# Patient Record
Sex: Female | Born: 1985 | Race: White | Hispanic: No | Smoking: Former smoker
Health system: Southern US, Community
[De-identification: ages and names within clinical notes are randomized; demographics above are authoritative.]

## PROBLEM LIST (undated history)

## (undated) ENCOUNTER — Emergency Department: Admission: EM | Payer: Medicaid Other | Source: Home / Self Care

## (undated) DIAGNOSIS — M359 Systemic involvement of connective tissue, unspecified: Secondary | ICD-10-CM

## (undated) DIAGNOSIS — N281 Cyst of kidney, acquired: Secondary | ICD-10-CM

## (undated) DIAGNOSIS — N12 Tubulo-interstitial nephritis, not specified as acute or chronic: Secondary | ICD-10-CM

## (undated) DIAGNOSIS — F410 Panic disorder [episodic paroxysmal anxiety] without agoraphobia: Secondary | ICD-10-CM

## (undated) DIAGNOSIS — G935 Compression of brain: Secondary | ICD-10-CM

## (undated) DIAGNOSIS — F419 Anxiety disorder, unspecified: Secondary | ICD-10-CM

## (undated) DIAGNOSIS — R310 Gross hematuria: Secondary | ICD-10-CM

## (undated) HISTORY — DX: Panic disorder (episodic paroxysmal anxiety): F41.0

## (undated) HISTORY — PX: MYRINGOTOMY WITH TUBE PLACEMENT: SHX5663

## (undated) HISTORY — DX: Cyst of kidney, acquired: N28.1

## (undated) HISTORY — PX: DENTAL SURGERY: SHX609

## (undated) HISTORY — DX: Tubulo-interstitial nephritis, not specified as acute or chronic: N12

## (undated) HISTORY — DX: Anxiety disorder, unspecified: F41.9

## (undated) HISTORY — PX: CYST REMOVAL PEDIATRIC: SHX6282

## (undated) HISTORY — DX: Gross hematuria: R31.0

## (undated) HISTORY — PX: SPINE SURGERY: SHX786

---

## 2013-09-25 ENCOUNTER — Emergency Department: Payer: Self-pay | Admitting: Emergency Medicine

## 2013-09-25 LAB — URINALYSIS, COMPLETE
Bilirubin,UR: NEGATIVE
Glucose,UR: 50 mg/dL (ref 0–75)
Ketone: NEGATIVE
Nitrite: NEGATIVE
PH: 6 (ref 4.5–8.0)
Protein: >=500
RBC,UR: 10113 /HPF (ref 0–5)
Specific Gravity: 1.015 (ref 1.003–1.030)
Squamous Epithelial: NONE SEEN
WBC UR: 1139 /HPF (ref 0–5)

## 2013-09-25 LAB — COMPREHENSIVE METABOLIC PANEL
ALBUMIN: 3.1 g/dL — AB (ref 3.4–5.0)
Alkaline Phosphatase: 56 U/L
Anion Gap: 7 (ref 7–16)
BILIRUBIN TOTAL: 0.4 mg/dL (ref 0.2–1.0)
BUN: 3 mg/dL — AB (ref 7–18)
CHLORIDE: 108 mmol/L — AB (ref 98–107)
Calcium, Total: 8.3 mg/dL — ABNORMAL LOW (ref 8.5–10.1)
Co2: 24 mmol/L (ref 21–32)
Creatinine: 0.25 mg/dL — ABNORMAL LOW (ref 0.60–1.30)
EGFR (Non-African Amer.): >60
GLUCOSE: 81 mg/dL (ref 65–99)
Osmolality: 273 (ref 275–301)
Potassium: 3.3 mmol/L — ABNORMAL LOW (ref 3.5–5.1)
SGOT(AST): 20 U/L (ref 15–37)
SGPT (ALT): 16 U/L (ref 12–78)
SODIUM: 139 mmol/L (ref 136–145)
Total Protein: 6.5 g/dL (ref 6.4–8.2)

## 2013-09-25 LAB — CBC
HCT: 33.9 % — ABNORMAL LOW
HGB: 11.1 g/dL — ABNORMAL LOW
MCH: 29.6 pg
MCHC: 32.9 g/dL
MCV: 90 fL
Platelet: 174 x10 3/mm 3
RBC: 3.77 X10 6/mm 3 — ABNORMAL LOW
RDW: 13.5 %
WBC: 18 x10 3/mm 3 — ABNORMAL HIGH

## 2013-09-25 LAB — LIPASE, BLOOD: LIPASE: 86 U/L (ref 73–393)

## 2013-09-25 LAB — HCG, QUANTITATIVE, PREGNANCY: Beta Hcg, Quant.: 17155 m[IU]/mL — ABNORMAL HIGH

## 2013-09-26 LAB — URINE CULTURE

## 2013-09-29 ENCOUNTER — Ambulatory Visit: Payer: Self-pay

## 2013-10-10 ENCOUNTER — Encounter: Payer: Self-pay | Admitting: Maternal & Fetal Medicine

## 2014-02-06 ENCOUNTER — Inpatient Hospital Stay: Payer: Self-pay | Admitting: Obstetrics and Gynecology

## 2014-02-06 LAB — PIH PROFILE
ANION GAP: 9 (ref 7–16)
AST: 17 U/L (ref 15–37)
BUN: 5 mg/dL — ABNORMAL LOW (ref 7–18)
CO2: 24 mmol/L (ref 21–32)
CREATININE: 0.51 mg/dL — AB (ref 0.60–1.30)
Calcium, Total: 8.8 mg/dL (ref 8.5–10.1)
Chloride: 105 mmol/L (ref 98–107)
EGFR (Non-African Amer.): >60
Glucose: 82 mg/dL (ref 65–99)
HCT: 30.9 % — AB (ref 35.0–47.0)
HGB: 9.8 g/dL — ABNORMAL LOW (ref 12.0–16.0)
MCH: 25.7 pg — ABNORMAL LOW (ref 26.0–34.0)
MCHC: 31.6 g/dL — AB (ref 32.0–36.0)
MCV: 81 fL (ref 80–100)
OSMOLALITY: 272 (ref 275–301)
Platelet: 268 10*3/uL (ref 150–440)
Potassium: 3.7 mmol/L (ref 3.5–5.1)
RBC: 3.8 10*6/uL (ref 3.80–5.20)
RDW: 15 % — AB (ref 11.5–14.5)
SODIUM: 138 mmol/L (ref 136–145)
URIC ACID: 3.8 mg/dL (ref 2.6–6.0)
WBC: 13.3 10*3/uL — AB (ref 3.6–11.0)

## 2014-02-07 LAB — PIH PROFILE
AST: 24 U/L (ref 15–37)
Anion Gap: 7 (ref 7–16)
BUN: 6 mg/dL — AB (ref 7–18)
CO2: 24 mmol/L (ref 21–32)
Calcium, Total: 7.4 mg/dL — ABNORMAL LOW (ref 8.5–10.1)
Chloride: 104 mmol/L (ref 98–107)
Creatinine: 0.55 mg/dL — ABNORMAL LOW (ref 0.60–1.30)
EGFR (Non-African Amer.): >60
GLUCOSE: 154 mg/dL — AB (ref 65–99)
HCT: 26 % — AB (ref 35.0–47.0)
HGB: 8.2 g/dL — ABNORMAL LOW (ref 12.0–16.0)
MCH: 25.4 pg — ABNORMAL LOW (ref 26.0–34.0)
MCHC: 31.5 g/dL — ABNORMAL LOW (ref 32.0–36.0)
MCV: 81 fL (ref 80–100)
OSMOLALITY: 271 (ref 275–301)
Platelet: 240 10*3/uL (ref 150–440)
Potassium: 3.8 mmol/L (ref 3.5–5.1)
RBC: 3.21 10*6/uL — ABNORMAL LOW (ref 3.80–5.20)
RDW: 15.1 % — AB (ref 11.5–14.5)
Sodium: 135 mmol/L — ABNORMAL LOW (ref 136–145)
URIC ACID: 3.9 mg/dL (ref 2.6–6.0)
WBC: 22.1 10*3/uL — ABNORMAL HIGH (ref 3.6–11.0)

## 2014-02-07 LAB — PROTEIN / CREATININE RATIO, URINE
Creatinine, Urine: 26.6 mg/dL — ABNORMAL LOW (ref 30.0–125.0)
Protein, Random Urine: 10 mg/dL (ref 0–12)
Protein/Creat. Ratio: 376 mg/gCREAT — ABNORMAL HIGH (ref 0–200)

## 2014-02-08 LAB — PIH PROFILE
ANION GAP: 5 — AB (ref 7–16)
BUN: 3 mg/dL — ABNORMAL LOW (ref 7–18)
CALCIUM: 7 mg/dL — AB (ref 8.5–10.1)
CHLORIDE: 102 mmol/L (ref 98–107)
CO2: 29 mmol/L (ref 21–32)
CREATININE: 0.49 mg/dL — AB (ref 0.60–1.30)
EGFR (African American): >60
Glucose: 91 mg/dL (ref 65–99)
HCT: 26.4 % — ABNORMAL LOW (ref 35.0–47.0)
HGB: 8.4 g/dL — AB (ref 12.0–16.0)
MCH: 25.5 pg — ABNORMAL LOW (ref 26.0–34.0)
MCHC: 31.8 g/dL — ABNORMAL LOW (ref 32.0–36.0)
MCV: 80 fL (ref 80–100)
Osmolality: 268 (ref 275–301)
POTASSIUM: 3.6 mmol/L (ref 3.5–5.1)
Platelet: 259 10*3/uL (ref 150–440)
RBC: 3.28 10*6/uL — ABNORMAL LOW (ref 3.80–5.20)
RDW: 15.1 % — AB (ref 11.5–14.5)
SGOT(AST): 27 U/L (ref 15–37)
SODIUM: 136 mmol/L (ref 136–145)
URIC ACID: 3.2 mg/dL (ref 2.6–6.0)
WBC: 17.6 10*3/uL — AB (ref 3.6–11.0)

## 2014-02-08 LAB — HEMATOCRIT: HCT: 24.6 % — AB (ref 35.0–47.0)

## 2014-03-07 ENCOUNTER — Emergency Department: Payer: Self-pay | Admitting: Emergency Medicine

## 2014-03-07 LAB — URINALYSIS, COMPLETE
Bacteria: NONE SEEN
Bilirubin,UR: NEGATIVE
Glucose,UR: NEGATIVE mg/dL (ref 0–75)
KETONE: NEGATIVE
LEUKOCYTE ESTERASE: NEGATIVE
NITRITE: NEGATIVE
PH: 6 (ref 4.5–8.0)
PROTEIN: NEGATIVE
RBC,UR: 1 /HPF (ref 0–5)
SPECIFIC GRAVITY: 1.009 (ref 1.003–1.030)
Squamous Epithelial: 3
Transitional Epi: <1

## 2014-03-07 LAB — CBC
HCT: 35.5 % (ref 35.0–47.0)
HGB: 11.2 g/dL — ABNORMAL LOW (ref 12.0–16.0)
MCH: 26.5 pg (ref 26.0–34.0)
MCHC: 31.6 g/dL — ABNORMAL LOW (ref 32.0–36.0)
MCV: 84 fL (ref 80–100)
Platelet: 215 10*3/uL (ref 150–440)
RBC: 4.24 10*6/uL (ref 3.80–5.20)
RDW: 17.8 % — AB (ref 11.5–14.5)
WBC: 10.6 10*3/uL (ref 3.6–11.0)

## 2014-03-07 LAB — COMPREHENSIVE METABOLIC PANEL
ALBUMIN: 3.5 g/dL (ref 3.4–5.0)
ALT: 17 U/L
Alkaline Phosphatase: 100 U/L
Anion Gap: 6 — ABNORMAL LOW (ref 7–16)
BILIRUBIN TOTAL: 0.6 mg/dL (ref 0.2–1.0)
BUN: 9 mg/dL (ref 7–18)
Calcium, Total: 8.4 mg/dL — ABNORMAL LOW (ref 8.5–10.1)
Chloride: 104 mmol/L (ref 98–107)
Co2: 28 mmol/L (ref 21–32)
Creatinine: 0.55 mg/dL — ABNORMAL LOW (ref 0.60–1.30)
EGFR (Non-African Amer.): >60
Glucose: 89 mg/dL (ref 65–99)
OSMOLALITY: 274 (ref 275–301)
Potassium: 3.9 mmol/L (ref 3.5–5.1)
SGOT(AST): 16 U/L (ref 15–37)
Sodium: 138 mmol/L (ref 136–145)
Total Protein: 6.9 g/dL (ref 6.4–8.2)

## 2014-03-07 LAB — LIPASE, BLOOD: Lipase: 80 U/L (ref 73–393)

## 2014-06-07 ENCOUNTER — Ambulatory Visit: Payer: Self-pay | Admitting: Physician Assistant

## 2014-06-09 ENCOUNTER — Emergency Department: Payer: Self-pay | Admitting: Emergency Medicine

## 2014-07-17 DIAGNOSIS — M549 Dorsalgia, unspecified: Secondary | ICD-10-CM

## 2014-07-17 DIAGNOSIS — G8929 Other chronic pain: Secondary | ICD-10-CM | POA: Insufficient documentation

## 2014-10-05 LAB — HM PAP SMEAR: HM Pap smear: NEGATIVE

## 2014-10-11 DIAGNOSIS — F419 Anxiety disorder, unspecified: Secondary | ICD-10-CM | POA: Insufficient documentation

## 2014-10-17 NOTE — H&P (Signed)
L&D Evaluation:  History:  HPI [id number], estimated date of confinement 02/13/2014, EGA 39.1 weeks [facility]   Patient's Medical History Anemia; Pyelonephritis;   Patient's Surgical History none   Medications Pre Natal Vitamins   Allergies NKDA   Social History none   Family History Non-Contributory   ROS:  General normal   HEENT normal   CNS HA   GI normal   GU normal   Resp normal   CV normal   Renal normal   MS normal   Exam:  Vital Signs BP >140/90   Urine Protein trace   General no apparent distress   Mental Status clear   Heart normal sinus rhythm   Abdomen gravid, non-tender   Estimated Fetal Weight Average for gestational age, 467"12   Fundal Height 37   Back no CVAT   Edema no edema   Reflexes 3+   Clonus negative   Pelvic no external lesions   Mebranes Intact   FHT normal rate with no decels   Skin dry   Lymph no lymphadenopathy   Other O+/ATB-/NR/RIHIV-/GBS-/Glucola 117/Panorama-   Impression:  Impression 39.0 week Intrauterine pregnancy with PIH   Plan:  Plan Cytotec/Pitocin IOL; Epidural prn   Comments Cath UA. If 1+ protein, Start Magnesium Sulfate Prophylaxis.   Electronic Signatures: Jezreel Sisk, Prentice DockerMartin A (MD)  (Signed 01-Sep-15 07:48)  Authored: L&D Evaluation   Last Updated: 01-Sep-15 07:48 by Jacobb Alen, Prentice DockerMartin A (MD)

## 2014-12-01 ENCOUNTER — Encounter: Payer: Self-pay | Admitting: Urology

## 2014-12-01 ENCOUNTER — Ambulatory Visit (INDEPENDENT_AMBULATORY_CARE_PROVIDER_SITE_OTHER): Payer: BLUE CROSS/BLUE SHIELD | Admitting: Urology

## 2014-12-01 VITALS — BP 107/72 | HR 75 | Ht 61.24 in | Wt 96.8 lb

## 2014-12-01 DIAGNOSIS — N2889 Other specified disorders of kidney and ureter: Secondary | ICD-10-CM | POA: Diagnosis not present

## 2014-12-01 DIAGNOSIS — R310 Gross hematuria: Secondary | ICD-10-CM | POA: Diagnosis not present

## 2014-12-01 LAB — URINALYSIS, COMPLETE
BILIRUBIN UA: NEGATIVE
Glucose, UA: NEGATIVE
Ketones, UA: NEGATIVE
Leukocytes, UA: NEGATIVE
Nitrite, UA: NEGATIVE
Specific Gravity, UA: 1.02 (ref 1.005–1.030)
Urobilinogen, Ur: 0.2 mg/dL (ref 0.2–1.0)
pH, UA: 8.5 — ABNORMAL HIGH (ref 5.0–7.5)

## 2014-12-01 LAB — MICROSCOPIC EXAMINATION

## 2014-12-01 MED ORDER — DIAZEPAM 10 MG PO TABS: 10.0000 mg | ORAL_TABLET | Freq: Once | ORAL | Status: DC

## 2014-12-01 NOTE — Progress Notes (Signed)
12/01/2014 10:10 PM   Kristy Hansen 1985-06-11 [id number]  Referring provider: No referring provider defined for this encounter.  Chief Complaint  Patient presents with  . Right renal cyst    referral from [facility] clinic    HPI: Kristy Hansen is a 29 year old white female who presented to [place] one year ago with gross hematuria.  She was pregnant at that time and was to follow up with [place] once she delivered for contrast studies.  She returns now for that follow up.  She does not endorse any recent gross hematuria.  She is having right sided flank pain at this time.  The right flank pain has been occuring for about two weeks.  Pain does not radiate. The pain is independent of movement or food consumption. She also has been having urinary frequency and urgency. She denies any fevers, chills, nausea, vomiting, abdominal pain or passage of any stone fragments. She does state her urine has been darker and bubbly. Her UA today is unremarkable. RUS performed on 03/07/2014 noted a left renal lesion with questionable vascularity for which a MR with and  contrast is recommended.     PMH: Past Medical History  Diagnosis Date  . Depressed   . Hematuria, gross   . Pyelonephritis   . Renal cyst     Surgical History: Past Surgical History  Procedure Laterality Date  . Myringotomy with tube placement    . Cyst removal pediatric      Spine    Home Medications:    Medication List       This list is accurate as of: 12/01/14 11:59 PM.  Always use your most recent med list.               citalopram 20 MG tablet  Commonly known as:  CELEXA  TK 1 T PO QD     diazepam 10 MG tablet  Commonly known as:  VALIUM  Take 1 tablet (10 mg total) by mouth once. Take 30 minutes prior to the MRI.  Must have driver.     escitalopram 10 MG tablet  Commonly known as:  LEXAPRO  Take 5 mg by mouth.     PRENATAL 28-0.8 MG Tabs  Take by mouth.        Allergies:  Allergies  Allergen Reactions    . Aspirin   . Naprosyn [Naproxen]   . Penicillins   . Tramadol Hives    Family History: Family History  Problem Relation Age of Onset  . Cancer Mother     breast cancer, ovarian cancer  . Kidney disease Neg Hx   . Bladder Cancer Neg Hx   . Prostate cancer Neg Hx     Social History:  reports that she has been smoking.  She does not have any smokeless tobacco history on file. She reports that she does not drink alcohol or use illicit drugs.  ROS: Urological Symptom Review  Patient is experiencing the following symptoms: Frequent urination Hard to postpone urination   Review of Systems  Gastrointestinal (upper)  : Nausea  Gastrointestinal (lower) : Diarrhea  Constitutional : Negative for symptoms  Skin: Negative for skin symptoms  Eyes: Blurred vision  Ear/Nose/Throat : Negative for Ear/Nose/Throat symptoms  Hematologic/Lymphatic: Negative for Hematologic/Lymphatic symptoms  Cardiovascular : Negative for cardiovascular symptoms  Respiratory : Cough  Endocrine: Excessive thirst  Musculoskeletal: Back pain Joint pain  Neurological: Negative for neurological symptoms  Psychologic: Depression Anxiety   Physical Exam: BP  107/72 mmHg  Pulse 75  Ht 5' 1.24" (1.555 m)  Wt 96 lb 12.8 oz (43.908 kg)  BMI 18.16 kg/m2  LMP 12/01/2014   Laboratory Data: Results for orders placed or performed in visit on 12/01/14  Microscopic Examination  Result Value Ref Range   WBC, UA 0-5 0 -  5 /hpf   RBC, UA 0-2 0 -  2 /hpf   Epithelial Cells (non renal) 0-10 0 - 10 /hpf   Crystals Present (A) N/A   Crystal Type Amorphous Sediment N/A   Bacteria, UA Moderate (A) None seen/Few  Urinalysis, Complete  Result Value Ref Range   Specific Gravity, UA 1.020 1.005 - 1.030   pH, UA 8.5 (H) 5.0 - 7.5   Color, UA Yellow Yellow   Appearance Ur Hazy (A) Clear   Leukocytes, UA Negative Negative   Protein, UA Trace (A) Negative/Trace   Glucose, UA Negative  Negative   Ketones, UA Negative Negative   RBC, UA 1+ (A) Negative   Bilirubin, UA Negative Negative   Urobilinogen, Ur 0.2 0.2 - 1.0 mg/dL   Nitrite, UA Negative Negative   Microscopic Examination See below:    Lab Results  Component Value Date   WBC 10.6 03/07/2014   HGB 11.2* 03/07/2014   HCT 35.5 03/07/2014   MCV 84 03/07/2014   PLT 215 03/07/2014    Lab Results  Component Value Date   CREATININE 0.55* 03/07/2014    No results found for: PSA  No results found for: TESTOSTERONE  No results found for: HGBA1C  Urinalysis    Component Value Date/Time   GLUCOSEU Negative 12/01/2014 1011   BILIRUBINUR Negative 12/01/2014 1011   NITRITE Negative 12/01/2014 1011   LEUKOCYTESUR Negative 12/01/2014 1011    Pertinent Imaging: REASON FOR EXAM:    right flank pain COMMENTS: PROCEDURE:     [place]  - US KIDNEY  - Mar 07 2014 11:48AM CLINICAL DATA:  29 year old female 62 days postpartum. Right flank pain. EXAM: RENAL/URINARY TRACT ULTRASOUND COMPLETE COMPARISON:  09/29/2013 renal sonogram.  09/25/2013 abdominal MR. FINDINGS: Right Kidney: Length: 11.6 cm..  No hydronephrosis.  Echogenic renal pyramids. Left Kidney: Length: 11.0 cm.. No hydronephrosis. Echogenic renal pyramids. Within the upper pole the left kidney is a 2 cm structure which cannot be confirmed as a simple cyst on the prior noncontrast MR. On limited color Doppler imaging performed through this examination, questionable nodular vascularity along the periphery of this structure. Contrast-enhanced MR recommended for further delineation (after patient's acute episode has cleared). Bladder: Appears normal for degree of bladder distention. Bilateral ureteral jets are noted. IMPRESSION: No hydronephrosis. Echogenic renal pyramids. This can be seen in multiple settings including medullary sponge disease. 2 cm left upper pole lesion cannot be confirmed as a simple cyst on the prior noncontrast MR. On limited  color Doppler imaging performed through this examination, questionable nodular vascularity along the periphery of this structure. Contrast-enhanced MR recommended for further delineation (can be obtained after patient's acute episode has cleared). Electronically  Signed By: Bridgett Larsson M.D.   Assessment & Plan:    1. Gross hematuria:  Patient was having symptoms of gross hematuria one year ago. We do not pursue contrast studies at that time due to her pregnancy. Patient was found to have a 2 cm left upper pole lesion which cannot be confirmed as a simple cyst on the prior noncontrast MR. On the renal ultrasound it had questionable nodular vascularity along the periphery of the structure. It is  recommended that the patient undergo a contrast-enhanced MR for further delineation.  Patient is not having any further gross hematuria. She does not have microscopic hematuria on today's exam.  We will continue to monitor with follow-up UA at her MR result visit.  - Urinalysis, Complete  2.  Renal lesion:  Patient was found to have a 2 cm left upper pole lesion which cannot be confirmed as a simple cyst. She'll be scheduled for an MR of the abdomen with and without contrast for further delineation.  Patient does not have a pacemaker  She'll return for follow-up to discuss MRI results.     Return for MRI report .  Michiel Cowboy, PA-C  [facility] [address] [place], [place] [address] [phone]

## 2014-12-05 DIAGNOSIS — N2889 Other specified disorders of kidney and ureter: Secondary | ICD-10-CM | POA: Insufficient documentation

## 2014-12-05 DIAGNOSIS — R310 Gross hematuria: Secondary | ICD-10-CM | POA: Insufficient documentation

## 2014-12-12 ENCOUNTER — Ambulatory Visit (INDEPENDENT_AMBULATORY_CARE_PROVIDER_SITE_OTHER): Payer: BLUE CROSS/BLUE SHIELD | Admitting: Obstetrics and Gynecology

## 2014-12-12 ENCOUNTER — Encounter: Payer: Self-pay | Admitting: Obstetrics and Gynecology

## 2014-12-12 VITALS — BP 124/89 | HR 71 | Ht 61.0 in | Wt 97.1 lb

## 2014-12-12 DIAGNOSIS — Z302 Encounter for sterilization: Secondary | ICD-10-CM | POA: Diagnosis not present

## 2014-12-12 DIAGNOSIS — R202 Paresthesia of skin: Secondary | ICD-10-CM

## 2014-12-12 DIAGNOSIS — Z720 Tobacco use: Secondary | ICD-10-CM

## 2014-12-12 DIAGNOSIS — Z8041 Family history of malignant neoplasm of ovary: Secondary | ICD-10-CM

## 2014-12-12 NOTE — Progress Notes (Signed)
Patient ID: Kristy Hansen, female   DOB: 04/26/1986, 29 y.o.   MRN: [id number] Pt presents for consultation for MS.  [facility] and [facility] to rheumatologist and from there she has been referred to neurologist (to be seen on 12/27/14 and has MRI on Friday to look at kidney function.   GYN ENCOUNTER NOTE  Subjective:       Kristy Hansen is a 29 y.o. [phone] female is here for gynecologic evaluation of the following issues:  1. Tube removal for sterilization  Pt is currently using condoms for birth control and would like a more permanent solution. Pt has "four healthy children" and endorses not wanting anymore. Reports mother having ovarian cancer; would like to be tested to possibly have ovaries removed as well as tubes. Pt is an everyday smoker.   Gynecologic History Patient's last menstrual period was 11/24/2014. Contraception: condoms  Obstetric History OB History  Gravida Para Term Preterm AB SAB TAB Ectopic Multiple Living  4 4 4       4     # Outcome Date GA Lbr Len/2nd Weight Sex Delivery Anes PTL Lv  4 Term 02/07/14    F Vag-Spont  N Y  3 Term 03/24/10    F Vag-Spont  N Y  2 Term 02/24/08    [name] Vag-Spont  N Y  1 Term 01/22/07    [name] Vag-Spont   Y      Past Medical History  Diagnosis Date  . Hematuria, gross   . Pyelonephritis   . Renal cyst   . Anxiety     Past Surgical History  Procedure Laterality Date  . Myringotomy with tube placement    . Cyst removal pediatric      Spine    Current Outpatient Prescriptions on File Prior to Visit  Medication Sig Dispense Refill  . escitalopram (LEXAPRO) 10 MG tablet Take 5 mg by mouth.     Marland Kitchen. PRENATAL 28-0.8 MG TABS Take by mouth.    . diazepam (VALIUM) 10 MG tablet Take 1 tablet (10 mg total) by mouth once. Take 30 minutes prior to the MRI.  Must have driver. 1 tablet 0   No current facility-administered medications on file prior to visit.    Allergies  Allergen Reactions  . Aspirin   . Naprosyn  [Naproxen]   . Penicillins   . Tramadol Hives    History   Social History  . Marital Status: Married    Spouse Name: N/A  . Number of Children: N/A  . Years of Education: N/A   Occupational History  . Not on file.   Social History Main Topics  . Smoking status: Current Every Day Smoker    Types: Cigarettes  . Smokeless tobacco: Never Used  . Alcohol Use: No  . Drug Use: No  . Sexual Activity: Yes   Other Topics Concern  . Not on file   Social History Narrative    Family History  Problem Relation Age of Onset  . Cancer Mother     breast cancer, ovarian cancer  . Kidney disease Neg Hx   . Bladder Cancer Neg Hx   . Prostate cancer Neg Hx     The following portions of the patient's history were reviewed and updated as appropriate: allergies, current medications, past family history, past medical history, past social history, past surgical history and problem list.  Review of Systems Review of Systems - General ROS: negative for - chills, fatigue, fever,  hot flashes, malaise or night sweats. Hematological and Lymphatic ROS: negative for - bleeding problems or swollen lymph nodes Gastrointestinal ROS: negative for - abdominal pain, blood in stools, change in bowel habits and nausea/vomiting Musculoskeletal ROS: negative for - joint pain, muscle pain or muscular weakness Genito-Urinary ROS: Positive for - non-acute right-sided pelvic pain, negative for - change in menstrual cycle, dysmenorrhea, dyspareunia, dysuria, genital discharge, genital ulcers, hematuria, incontinence, irregular/heavy menses, nocturia  Neuro ROS: Positive for - numbness and tingling in hands, legs, and feet.   Objective:   BP 124/89 mmHg  Pulse 71  Ht  (1.549 m)  Wt 97 lb 1 oz (44.027 kg)  BMI 18.35 kg/m2  LMP 11/24/2014 CONSTITUTIONAL: Well-developed, well-nourished female in no acute distress.  HENT:  Normocephalic, atraumatic.  NECK: Normal range of motion, supple, no masses.  Normal  thyroid.  SKIN: Skin is warm and dry. No rash noted. Not diaphoretic. No erythema. No pallor. NEUROLGIC: Alert and oriented to person, place, and time. PSYCHIATRIC: Normal mood and affect. Normal behavior. Normal judgment and thought content. CARDIOVASCULAR: RRR, no murmurs RESPIRATORY: CTAB BREASTS: Not Examined ABDOMEN: Soft, non distended; Non tender.  No Organomegaly. PELVIC: Not examined MUSCULOSKELETAL: Normal range of motion. No tenderness.  No cyanosis, clubbing, or edema.  Assessment:   1. Encounter for sterilization     -husband declines vasectomy  2. Family history of ovarian cancer     - mother, unknown age     - mgm     - risk reduction strategies reviewed  3. Paresthesias     - work up ongoing  4. Tobacco user      Plan:   1. Consultation - schedule surgery for 6 weeks; salpingectomy vs. Salpingo-oophorectomy - pending genetic testing results  2. Genetic testing for ovarian cancer - MyRisk Hereditary Cancer Test - 25-gene panel   3.  Smoking cessation encouragement  4.  Return for pre-op in 6 weeks  A total of 25 minutes were spent face-to-face with the patient during this encounter and over half of that time involved counseling and coordination of care.   I have seen, interviewed, and examined the patient in conjunction with the [facility].A. student and affirm the diagnosis and management plan. Brionne Mertz A. Donatella Walski, MD, [facility]

## 2014-12-12 NOTE — Patient Instructions (Signed)
1. My risk genetic testing started. 2. Laparoscopic Tubal removal scheduled - 6 weeks Return in 6 weeks for pre op

## 2014-12-15 ENCOUNTER — Ambulatory Visit
Admission: RE | Admit: 2014-12-15 | Discharge: 2014-12-15 | Disposition: A | Discharge status: Home / Self Care | Payer: BLUE CROSS/BLUE SHIELD | Source: Ambulatory Visit | Attending: Urology | Admitting: Urology

## 2014-12-15 DIAGNOSIS — N281 Cyst of kidney, acquired: Secondary | ICD-10-CM | POA: Diagnosis not present

## 2014-12-15 DIAGNOSIS — N2889 Other specified disorders of kidney and ureter: Secondary | ICD-10-CM | POA: Diagnosis present

## 2014-12-15 MED ORDER — GADOBENATE DIMEGLUMINE 529 MG/ML IV SOLN
10.0000 mL | Freq: Once | INTRAVENOUS | Status: AC | PRN
  Administered 2014-12-15: 8 mL via INTRAVENOUS

## 2014-12-18 ENCOUNTER — Emergency Department
Admission: EM | Admit: 2014-12-18 | Discharge: 2014-12-19 | Disposition: A | Discharge status: Home / Self Care | Payer: BLUE CROSS/BLUE SHIELD | Attending: Emergency Medicine | Admitting: Emergency Medicine

## 2014-12-18 ENCOUNTER — Encounter: Payer: Self-pay | Admitting: *Deleted

## 2014-12-18 DIAGNOSIS — R609 Edema, unspecified: Secondary | ICD-10-CM | POA: Diagnosis present

## 2014-12-18 DIAGNOSIS — Z79899 Other long term (current) drug therapy: Secondary | ICD-10-CM | POA: Insufficient documentation

## 2014-12-18 DIAGNOSIS — Y998 Other external cause status: Secondary | ICD-10-CM | POA: Diagnosis not present

## 2014-12-18 DIAGNOSIS — Z880 Allergy status to penicillin: Secondary | ICD-10-CM | POA: Insufficient documentation

## 2014-12-18 DIAGNOSIS — Y9289 Other specified places as the place of occurrence of the external cause: Secondary | ICD-10-CM | POA: Diagnosis not present

## 2014-12-18 DIAGNOSIS — T63441A Toxic effect of venom of bees, accidental (unintentional), initial encounter: Secondary | ICD-10-CM | POA: Diagnosis not present

## 2014-12-18 DIAGNOSIS — Z720 Tobacco use: Secondary | ICD-10-CM | POA: Diagnosis not present

## 2014-12-18 DIAGNOSIS — Y9389 Activity, other specified: Secondary | ICD-10-CM | POA: Diagnosis not present

## 2014-12-18 HISTORY — DX: Systemic involvement of connective tissue, unspecified: M35.9

## 2014-12-18 MED ORDER — DEXAMETHASONE 1 MG/ML PO CONC
10.0000 mg | Freq: Once | ORAL | Status: AC
  Administered 2014-12-19: 10 mg via ORAL
  Filled 2014-12-18: qty 10

## 2014-12-18 MED ORDER — DEXAMETHASONE SODIUM PHOSPHATE 10 MG/ML IJ SOLN
INTRAMUSCULAR | Status: AC
  Filled 2014-12-18: qty 1

## 2014-12-18 NOTE — ED Notes (Signed)
Pt reports getting stung by a bee on her left posterior arm, pt has been taking benadryl however admits to progressively worse swelling, redness and pain. Pt speaking complete sentences, no acute distress.

## 2014-12-18 NOTE — ED Notes (Signed)
Pharmacy notified of need for Decadron PO as this is not available in ED pyxis.

## 2014-12-18 NOTE — Discharge Instructions (Signed)
As we discussed, I do not believe that the swelling and rash on your arm represents infection.  You are having a significant inflammatory reaction to the insect sting.  I recommend she continue taking over-the-counter Benadryl for the next several days and applying an over-the-counter ointment or lotion that includes hydrocortisone 1%.  Additionally, we gave you a one-time dose of Decadron 10 mg by mouth which should help with the inflammation as well.  You can use cool or cold compresses and keep your arm elevated when possible, both of which should also help.  Please read through the rest of the instructions below for additional recommendations.  Please follow up with your regular doctor, an urgent care, or this emergency Department in 2 days if the reaction has not improved, or return immediately if he gets worse or if he develop new symptoms that concern you, including but not limited to fever, vomiting, or worsening rash.   Bee, Wasp, or Hornet Sting Your caregiver has diagnosed you as having an insect sting. An insect sting appears as a red lump in the skin that sometimes has a tiny hole in the center, or it may have a stinger in the center of the wound. The most common stings are from wasps, hornets and bees. Individuals have different reactions to insect stings.  A normal reaction may cause pain, swelling, and redness around the sting site.  A localized allergic reaction may cause swelling and redness that extends beyond the sting site.  A large local reaction may continue to develop over the next 12 to 36 hours.  On occasion, the reactions can be severe (anaphylactic reaction). An anaphylactic reaction may cause wheezing; difficulty breathing; chest pain; fainting; raised, itchy, red patches on the skin; a sick feeling to your stomach (nausea); vomiting; cramping; or diarrhea. If you have had an anaphylactic reaction to an insect sting in the past, you are more likely to have one again. HOME  CARE INSTRUCTIONS   With bee stings, a small sac of poison is left in the wound. Brushing across this with something such as a credit card, or anything similar, will help remove this and decrease the amount of the reaction. This same procedure will not help a wasp sting as they do not leave behind a stinger and poison sac.  Apply a cold compress for 10 to 20 minutes every hour for 1 to 2 days, depending on severity, to reduce swelling and itching.  To lessen pain, a paste made of water and baking soda may be rubbed on the bite or sting and left on for 5 minutes.  To relieve itching and swelling, you may use take medication or apply medicated creams or lotions as directed.  Only take over-the-counter or prescription medicines for pain, discomfort, or fever as directed by your caregiver.  Wash the sting site daily with soap and water. Apply antibiotic ointment on the sting site as directed.  If you suffered a severe reaction:  If you did not require hospitalization, an adult will need to stay with you for 24 hours in case the symptoms return.  You may need to wear a medical bracelet or necklace stating the allergy.  You and your family need to learn when and how to use an anaphylaxis kit or epinephrine injection.  If you have had a severe reaction before, always carry your anaphylaxis kit with you. SEEK MEDICAL CARE IF:   None of the above helps within 2 to 3 days.  The area becomes  red, warm, tender, and swollen beyond the area of the bite or sting.  You have an oral temperature above 102 F (38.9 C). SEEK IMMEDIATE MEDICAL CARE IF:  You have symptoms of an allergic reaction which are:  Wheezing.  Difficulty breathing.  Chest pain.  Lightheadedness or fainting.  Itchy, raised, red patches on the skin.  Nausea, vomiting, cramping or diarrhea. ANY OF THESE SYMPTOMS MAY REPRESENT A SERIOUS PROBLEM THAT IS AN EMERGENCY. Do not wait to see if the symptoms will go away. Get  medical help right away. Call your local emergency services (911 in [place] DO NOT drive yourself to the hospital. MAKE SURE YOU:   Understand these instructions.  Will watch your condition.  Will get help right away if you are not doing well or get worse. Document Released: 05/26/2005 Document Revised: 08/18/2011 Document Reviewed: 11/10/2009 [facility] Patient Information 2015 [place], [place]. This information is not intended to replace advice given to you by your health care provider. Make sure you discuss any questions you have with your health care provider.

## 2014-12-18 NOTE — ED Provider Notes (Signed)
[facility] Emergency Department Provider Note  ____________________________________________  Time seen: Approximately 11:15 PM  I have reviewed the triage vital signs and the nursing notes.   HISTORY  Chief Complaint Insect Bite and Edema    HPI Kristy Hansen is a 29 y.o. female with a past medical history of anxiety and who is being worked up for autoimmune disease at the [facility] who presents with swelling, redness, and tenderness in her posterior left upper arm after being stung by an insect yesterday.  She has been taking some Benadryl and using ointment on the site but the swelling has gotten worse over the course of the last 24 hours.  She denies fever/chills, chest pain, shortness of breath, lightheadedness, abdominal pain, nausea/vomiting, diarrhea, constipation.  She thinks that she has been stung before by an insect as a child but is unaware of any severe allergy.  She is not certain what kind of insect it was; initially she said it was a bee, but then commented about it being a yellow jacket.She describes the discomfort as moderate and gradual in onset, slowly getting worse over the last day.  Nothing makes it better and nothing makes it worse.   Past Medical History  Diagnosis Date  . Hematuria, gross   . Pyelonephritis   . Renal cyst   . Anxiety   . Autoimmune disease     Patient Active Problem List   Diagnosis Date Noted  . Paresthesias 12/12/2014  . Family history of ovarian cancer 12/12/2014  . Tobacco user 12/12/2014  . Gross hematuria 12/05/2014  . Renal mass 12/05/2014  . Anxiety 10/11/2014  . Back pain, chronic 07/17/2014    Past Surgical History  Procedure Laterality Date  . Myringotomy with tube placement    . Cyst removal pediatric      Spine    Current Outpatient Rx  Name  Route  Sig  Dispense  Refill  . escitalopram (LEXAPRO) 10 MG tablet   Oral   Take 5 mg by mouth.          Marland Kitchen. PRENATAL 28-0.8 MG TABS    Oral   Take by mouth.         . diazepam (VALIUM) 10 MG tablet   Oral   Take 1 tablet (10 mg total) by mouth once. Take 30 minutes prior to the MRI.  Must have driver.   1 tablet   0     Allergies Penicillins; Aspirin; Naprosyn; and Tramadol  Family History  Problem Relation Age of Onset  . Cancer Mother     breast cancer, ovarian cancer  . Kidney disease Neg Hx   . Bladder Cancer Neg Hx   . Prostate cancer Neg Hx     Social History History  Substance Use Topics  . Smoking status: Current Some Day Smoker    Types: Cigarettes  . Smokeless tobacco: Never Used  . Alcohol Use: No    Review of Systems Constitutional: No fever/chills Eyes: No visual changes. ENT: No sore throat. Cardiovascular: Denies chest pain. Respiratory: Denies shortness of breath. Gastrointestinal: No abdominal pain.  No nausea, no vomiting.  No diarrhea.  No constipation. Genitourinary: Negative for dysuria. Musculoskeletal: Negative for back pain. Skin: Redness and tenderness and swelling of the left posterior arm Neurological: Negative for headaches, focal weakness or numbness.  10-point ROS otherwise negative.  ____________________________________________   PHYSICAL EXAM:  VITAL SIGNS: ED Triage Vitals  Enc Vitals Group     BP 12/18/14  2255 126/85 mmHg     Pulse Rate 12/18/14 2255 69     Resp 12/18/14 2255 16     Temp 12/18/14 2255 98.2 F (36.8 C)     Temp Source 12/18/14 2255 Oral     SpO2 12/18/14 2255 100 %     Weight 12/18/14 2255 97 lb (43.999 kg)     Height 12/18/14 2255 5' 1.25" (1.556 m)     Head Cir --      Peak Flow --      Pain Score 12/18/14 2255 2     Pain Loc --      Pain Edu? --      Excl. in GC? --     Constitutional: Alert and oriented. Well appearing and in no acute distress. Eyes: Conjunctivae are normal. PERRL. EOMI. Head: Atraumatic. Nose: No congestion/rhinnorhea. Neck: No stridor.   Cardiovascular: Normal rate, regular rhythm. Grossly normal  heart sounds.  Good peripheral circulation. Respiratory: Normal respiratory effort.  No retractions. Lungs CTAB.  No wheezing. Skin:  The patient has mild swelling and erythema covering most of the surface of her dorsal left upper arm.  There is no induration or other evidence of an abscess.  There is a small area of central white clearing, at most a millimeter in diameter, that may represent the site of injection, but upon close inspection I cannot appreciate a stinger or other embedded object.  The erythema is warm and mildly tender.  It does not extend distally to her elbow.   ____________________________________________   LABS (all labs ordered are listed, but only abnormal results are displayed)  Not indicated ____________________________________________  EKG  Not indicated ____________________________________________  RADIOLOGY  Not indicated  ____________________________________________   PROCEDURES  Procedure(s) performed: None  Critical Care performed: No ____________________________________________   INITIAL IMPRESSION / ASSESSMENT AND PLAN / ED COURSE  Pertinent labs & imaging results that were available during my care of the patient were reviewed by me and considered in my medical decision making (see chart for details).  The patient is well-appearing and has no systemic symptoms of either infection or severe allergic reaction.  Time course makes it very unlikely that the erythema on the back of her arm is cellulitis.  This is much more likely to be a moderate localized inflammation to the insect sting.  I discussed with her normal care of this type of reaction, most of which she is already doing (Benadryl by mouth, hydrocortisone ointment), and I will also give her a one-time dose of Decadron 10 mg by mouth to help systemically with the inflammation.  I gave her my usual and customary return precautions for allergic reaction and for possible  cellulitis.  ____________________________________________  FINAL CLINICAL IMPRESSION(S) / ED DIAGNOSES  Final diagnoses:  Allergic reaction to bee sting, accidental or unintentional, initial encounter      NEW MEDICATIONS STARTED DURING THIS VISIT:  New Prescriptions   No medications on file     Loleta Rose, MD 12/18/14 2345

## 2014-12-19 NOTE — ED Notes (Signed)
Pt ambulating independently w/ steady gait on d/c in no acute distress, A&Ox4. D/c instructions reviewed w/ pt - pt denies any further questions or concerns at present.  

## 2014-12-28 ENCOUNTER — Encounter: Payer: Self-pay | Admitting: Obstetrics and Gynecology

## 2015-01-01 ENCOUNTER — Other Ambulatory Visit: Payer: Self-pay | Admitting: Neurology

## 2015-01-01 DIAGNOSIS — R200 Anesthesia of skin: Secondary | ICD-10-CM

## 2015-01-01 DIAGNOSIS — R202 Paresthesia of skin: Secondary | ICD-10-CM

## 2015-01-02 ENCOUNTER — Encounter: Payer: Self-pay | Admitting: Obstetrics and Gynecology

## 2015-01-04 ENCOUNTER — Ambulatory Visit (INDEPENDENT_AMBULATORY_CARE_PROVIDER_SITE_OTHER): Payer: BLUE CROSS/BLUE SHIELD | Admitting: Urology

## 2015-01-04 ENCOUNTER — Encounter: Payer: Self-pay | Admitting: Urology

## 2015-01-04 VITALS — BP 108/76 | HR 69 | Ht 61.0 in | Wt 96.4 lb

## 2015-01-04 DIAGNOSIS — R310 Gross hematuria: Secondary | ICD-10-CM | POA: Diagnosis not present

## 2015-01-04 DIAGNOSIS — Q6100 Congenital renal cyst, unspecified: Secondary | ICD-10-CM | POA: Diagnosis not present

## 2015-01-04 DIAGNOSIS — N281 Cyst of kidney, acquired: Secondary | ICD-10-CM

## 2015-01-04 LAB — URINALYSIS, COMPLETE
Bilirubin, UA: NEGATIVE
Glucose, UA: NEGATIVE
Leukocytes, UA: NEGATIVE
Nitrite, UA: NEGATIVE
PH UA: 6 (ref 5.0–7.5)
Specific Gravity, UA: >1.03 — ABNORMAL HIGH (ref 1.005–1.030)
Urobilinogen, Ur: 1 mg/dL (ref 0.2–1.0)

## 2015-01-04 LAB — MICROSCOPIC EXAMINATION
BACTERIA UA: NONE SEEN
RBC MICROSCOPIC, UA: NONE SEEN /HPF (ref 0–?)

## 2015-01-04 NOTE — Progress Notes (Signed)
1:44 PM   Taressa Rauh Vogelsang 11-May-1986 [id number]  Referring provider: Sula Rumple, MD [address] MEDICAL [address] [place], [place] [address]  Chief Complaint  Patient presents with  . Other    MRI results    HPI: Mrs. Kristy Hansen is a 29 year old white female who was found to have a renal lesion that had questionable vascularity for which an MRI w and w/o was recommended for further evaluation.  She has undergone the MRI and presents today to discuss results.    She is currently not experiencing any gross hematuria, flank pain or difficulty with urination. She is also denies any fevers, chills, nausea or vomiting.  The dark and bubbly urine is no longer present.  Her UA today is unremarkable.  I have reviewed the images with the patient.  She was found to have a Bosniak 2 cyst in the area of concern of the left kidney.   PMH: Past Medical History  Diagnosis Date  . Hematuria, gross   . Pyelonephritis   . Renal cyst   . Anxiety   . Autoimmune disease     Surgical History: Past Surgical History  Procedure Laterality Date  . Myringotomy with tube placement    . Cyst removal pediatric      Spine    Home Medications:    Medication List       This list is accurate as of: 01/04/15  1:44 PM.  Always use your most recent med list.               escitalopram 10 MG tablet  Commonly known as:  LEXAPRO  Take 5 mg by mouth.     fluocinonide cream 0.05 %  Commonly known as:  LIDEX     PRENATAL 28-0.8 MG Tabs  Take by mouth.        Allergies:  Allergies  Allergen Reactions  . Penicillins Shortness Of Breath and Swelling  . Aspirin Hives  . Naprosyn [Naproxen] Hives  . Tramadol Hives    Family History: Family History  Problem Relation Age of Onset  . Cancer Mother     breast cancer, ovarian cancer  . Kidney disease Neg Hx   . Bladder Cancer Neg Hx   . Prostate cancer Neg Hx     Social History:  reports that she has been smoking Cigarettes.  She has never used  smokeless tobacco. She reports that she does not drink alcohol or use illicit drugs.  ROS: Urological Symptom Review  Patient is experiencing the following symptoms: Frequent urination Hard to postpone urination   Review of Systems  Gastrointestinal (upper)  : Nausea  Gastrointestinal (lower) : Diarrhea  Constitutional : Negative for symptoms  Skin: Negative for skin symptoms  Eyes: Blurred vision  Ear/Nose/Throat : Negative for Ear/Nose/Throat symptoms  Hematologic/Lymphatic: Negative for Hematologic/Lymphatic symptoms  Cardiovascular : Negative for cardiovascular symptoms  Respiratory : Cough  Endocrine: Excessive thirst  Musculoskeletal: Back pain Joint pain  Neurological: Negative for neurological symptoms  Psychologic: Depression Anxiety   Physical Exam: BP 108/76 mmHg  Pulse 69  Ht 5\' 1"  (1.549 m)  Wt 96 lb 6.4 oz (43.727 kg)  BMI 18.22 kg/m2  LMP 12/19/2014   Laboratory Data: Results for orders placed or performed in visit on 01/04/15  Microscopic Examination  Result Value Ref Range   WBC, UA 0-5 0 -  5 /hpf   RBC, UA None seen 0 -  2 /hpf   Epithelial Cells (non renal) 0-10  0 - 10 /hpf   Mucus, UA Present (A) Not Estab.   Bacteria, UA None seen None seen/Few  Urinalysis, Complete  Result Value Ref Range   Specific Gravity, UA >1.030 (H) 1.005 - 1.030   pH, UA 6.0 5.0 - 7.5   Color, UA Yellow Yellow   Appearance Ur Clear Clear   Leukocytes, UA Negative Negative   Protein, UA 1+ (A) Negative/Trace   Glucose, UA Negative Negative   Ketones, UA Trace (A) Negative   RBC, UA Trace (A) Negative   Bilirubin, UA Negative Negative   Urobilinogen, Ur 1.0 0.2 - 1.0 mg/dL   Nitrite, UA Negative Negative   Microscopic Examination See below:    Results for orders placed or performed in visit on 12/12/14  HM PAP SMEAR  Result Value Ref Range   HM Pap smear negative    Lab Results  Component Value Date   WBC 10.6 03/07/2014   HGB  11.2* 03/07/2014   HCT 35.5 03/07/2014   MCV 84 03/07/2014   PLT 215 03/07/2014    Lab Results  Component Value Date   CREATININE 0.55* 03/07/2014    No results found for: PSA  No results found for: TESTOSTERONE  No results found for: HGBA1C  Urinalysis    Component Value Date/Time   COLORURINE Yellow 03/07/2014 1103   APPEARANCEUR Clear 03/07/2014 1103   LABSPEC 1.009 03/07/2014 1103   PHURINE 6.0 03/07/2014 1103   GLUCOSEU Negative 12/01/2014 1011   GLUCOSEU Negative 03/07/2014 1103   HGBUR 2+ 03/07/2014 1103   BILIRUBINUR Negative 12/01/2014 1011   BILIRUBINUR Negative 03/07/2014 1103   KETONESUR Negative 03/07/2014 1103   PROTEINUR Negative 03/07/2014 1103   NITRITE Negative 12/01/2014 1011   NITRITE Negative 03/07/2014 1103   LEUKOCYTESUR Negative 12/01/2014 1011   LEUKOCYTESUR Negative 03/07/2014 1103    Pertinent Imaging: CLINICAL DATA: Evaluate mass on left kidney.  EXAM: MRI ABDOMEN WITHOUT AND WITH CONTRAST  TECHNIQUE: Multiplanar multisequence MR imaging of the abdomen was performed both before and after the administration of intravenous contrast.  CONTRAST: 8mL MULTIHANCE GADOBENATE DIMEGLUMINE 529 MG/ML IV SOLN  COMPARISON: 09/25/2013  FINDINGS: Lower chest: No pleural effusion. No pericardial effusion.  Hepatobiliary: No suspicious liver abnormalities. The gallbladder appears normal. No biliary dilatation.  Pancreas: Normal appearance of the pancreas.  Spleen: The spleen is unremarkable.  Adrenals/Urinary Tract: Normal appearance of the adrenal glands. Normal appearance of the right kidney. T2 hyperintense structure arising from the upper pole of the left kidney measures 2.3 cm and is unchanged from previous exam. This exhibits mild increased T1 signal indicating presence of proteinaceous or hemorrhagic debris. No enhancement within this structure.  Stomach/Bowel: The stomach and the small bowel loops have a  normal course and caliber. The visualized portions of the colon are unremarkable.  Vascular/Lymphatic: The abdominal aorta appears normal. There is no upper abdominal adenopathy.  Other: There is no free fluid or fluid collections identified within the abdomen or pelvis.  Musculoskeletal: Normal signal from within the bone marrow.  IMPRESSION: 1. There is a Bosniak category 2 cyst arising from upper pole of left kidney. This is stable when compared with previous exam.   Electronically Signed  By: Signa Kell M.D.  On: 12/15/2014 10:01  Assessment & Plan:    1. Gross hematuria:  Patient was having symptoms of gross hematuria one year ago. We do not pursue contrast studies at that time due to her pregnancy. Patient was found to have a 2 cm left upper  pole lesion which cannot be confirmed as a simple cyst on the prior noncontrast MR. On the renal ultrasound it had questionable nodular vascularity along the periphery of the structure. MR is completed and it is ruled a Bosniak 2.   Patient is not having any further gross hematuria. She does not have microscopic hematuria on today's exam or on  the previous UA one month prior.  She will contact our office if she should experience any further gross hematuria.  - Urinalysis, Complete  2.  Renal lesion:  Patient was found to have a 2 cm left upper pole lesion which MR has determined to be a Bosniak 2 cyst.  No further work up is required.       No Follow-up on file.  Michiel Cowboy, PA-C  [facility] [address], [address] [place], [place] [address] [phone]

## 2015-01-05 DIAGNOSIS — N281 Cyst of kidney, acquired: Secondary | ICD-10-CM | POA: Insufficient documentation

## 2015-01-09 ENCOUNTER — Ambulatory Visit
Admission: RE | Admit: 2015-01-09 | Discharge: 2015-01-09 | Disposition: A | Discharge status: Home / Self Care | Payer: BLUE CROSS/BLUE SHIELD | Source: Ambulatory Visit | Attending: Neurology | Admitting: Neurology

## 2015-01-09 DIAGNOSIS — R200 Anesthesia of skin: Secondary | ICD-10-CM

## 2015-01-09 DIAGNOSIS — G629 Polyneuropathy, unspecified: Secondary | ICD-10-CM | POA: Diagnosis present

## 2015-01-09 DIAGNOSIS — R202 Paresthesia of skin: Secondary | ICD-10-CM | POA: Diagnosis present

## 2015-01-09 DIAGNOSIS — M5382 Other specified dorsopathies, cervical region: Secondary | ICD-10-CM | POA: Insufficient documentation

## 2015-01-09 DIAGNOSIS — G935 Compression of brain: Secondary | ICD-10-CM | POA: Diagnosis not present

## 2015-01-09 MED ORDER — GADOBENATE DIMEGLUMINE 529 MG/ML IV SOLN
10.0000 mL | Freq: Once | INTRAVENOUS | Status: AC | PRN
  Administered 2015-01-09: 8 mL via INTRAVENOUS

## 2015-01-19 ENCOUNTER — Telehealth: Payer: Self-pay | Admitting: Obstetrics and Gynecology

## 2015-01-19 NOTE — Telephone Encounter (Signed)
Pt states husband is going to have vasectomy so she will cancel surgery.

## 2015-01-19 NOTE — Telephone Encounter (Signed)
Kristy Hansen called and said to tell you that she is cancelling her surgery and wants you to call her back so she can tell you why. She specifically asked for you.

## 2015-01-23 ENCOUNTER — Encounter: Payer: BLUE CROSS/BLUE SHIELD | Admitting: Obstetrics and Gynecology

## 2015-05-29 ENCOUNTER — Other Ambulatory Visit: Payer: Self-pay | Admitting: Neurology

## 2015-05-29 DIAGNOSIS — Q068 Other specified congenital malformations of spinal cord: Secondary | ICD-10-CM

## 2015-06-08 ENCOUNTER — Ambulatory Visit
Admission: RE | Admit: 2015-06-08 | Discharge: 2015-06-08 | Disposition: A | Discharge status: Home / Self Care | Payer: BLUE CROSS/BLUE SHIELD | Source: Ambulatory Visit | Attending: Neurology | Admitting: Neurology

## 2015-06-08 DIAGNOSIS — M545 Low back pain: Secondary | ICD-10-CM | POA: Insufficient documentation

## 2015-06-08 DIAGNOSIS — M5136 Other intervertebral disc degeneration, lumbar region: Secondary | ICD-10-CM | POA: Diagnosis not present

## 2015-06-08 DIAGNOSIS — G8929 Other chronic pain: Secondary | ICD-10-CM | POA: Diagnosis not present

## 2015-06-08 DIAGNOSIS — Q068 Other specified congenital malformations of spinal cord: Secondary | ICD-10-CM | POA: Diagnosis present

## 2015-06-08 MED ORDER — GADOBENATE DIMEGLUMINE 529 MG/ML IV SOLN
8.0000 mL | Freq: Once | INTRAVENOUS | Status: AC | PRN
  Administered 2015-06-08: 8 mL via INTRAVENOUS

## 2015-06-30 ENCOUNTER — Emergency Department
Admission: EM | Admit: 2015-06-30 | Discharge: 2015-06-30 | Disposition: A | Discharge status: Home / Self Care | Payer: BLUE CROSS/BLUE SHIELD | Attending: Emergency Medicine | Admitting: Emergency Medicine

## 2015-06-30 ENCOUNTER — Encounter: Payer: Self-pay | Admitting: Emergency Medicine

## 2015-06-30 DIAGNOSIS — G8929 Other chronic pain: Secondary | ICD-10-CM | POA: Diagnosis not present

## 2015-06-30 DIAGNOSIS — Z79899 Other long term (current) drug therapy: Secondary | ICD-10-CM | POA: Insufficient documentation

## 2015-06-30 DIAGNOSIS — M549 Dorsalgia, unspecified: Secondary | ICD-10-CM | POA: Diagnosis not present

## 2015-06-30 DIAGNOSIS — Z880 Allergy status to penicillin: Secondary | ICD-10-CM | POA: Diagnosis not present

## 2015-06-30 DIAGNOSIS — F17210 Nicotine dependence, cigarettes, uncomplicated: Secondary | ICD-10-CM | POA: Diagnosis not present

## 2015-06-30 DIAGNOSIS — M79672 Pain in left foot: Secondary | ICD-10-CM | POA: Diagnosis present

## 2015-06-30 DIAGNOSIS — M722 Plantar fascial fibromatosis: Secondary | ICD-10-CM | POA: Diagnosis not present

## 2015-06-30 MED ORDER — OXYCODONE-ACETAMINOPHEN 5-325 MG PO TABS: 1.0000 | ORAL_TABLET | Freq: Four times a day (QID) | ORAL | Status: DC | PRN

## 2015-06-30 MED ORDER — PREDNISONE 20 MG PO TABS
60.0000 mg | ORAL_TABLET | Freq: Once | ORAL | Status: AC
  Administered 2015-06-30: 60 mg via ORAL
  Filled 2015-06-30: qty 3

## 2015-06-30 MED ORDER — PREDNISONE 20 MG PO TABS: ORAL_TABLET | ORAL | Status: DC

## 2015-06-30 NOTE — Discharge Instructions (Signed)

## 2015-06-30 NOTE — ED Notes (Signed)
Pt says she runs twice daily, about 2 miles each time; about a week ago she began having pain to the heels of both feet; now both feet hurt all over; says Ibuprofen was helping at first but not any longer

## 2015-06-30 NOTE — ED Notes (Signed)
Pt presents w/ pain to bilateral feet due to running; pt reports running approximately 4 miles a day but has not been able to in last three days due to pain.  Pt states pain radiates up legs to hip.  Pt ambulatory to room.

## 2015-06-30 NOTE — ED Provider Notes (Signed)
[facility] Emergency Department Provider Note  ____________________________________________  Time seen: Approximately 11:32 PM  I have reviewed the triage vital signs and the nursing notes.   HISTORY  Chief Complaint Foot Pain    HPI Kristy Hansen is a 30 y.o. female who presents with severe bilateral foot pain, primarily around the heels. She is a active runner who has increased her miles over the last several weeks. However this week she has not run because of the increasing heel pain. She tried again to run 3 days ago, which make the pain worse. She denies any new shoes. She denies known injury. Pain is worse upon initial walking. She has taken some ibuprofen. She has a medical history as below.   Past Medical History  Diagnosis Date  . Hematuria, gross   . Pyelonephritis   . Renal cyst   . Anxiety   . Autoimmune disease [facility])     Patient Active Problem List   Diagnosis Date Noted  . Renal cyst 01/05/2015  . Paresthesias 12/12/2014  . Family history of ovarian cancer 12/12/2014  . Tobacco user 12/12/2014  . Renal mass 12/05/2014  . Anxiety 10/11/2014  . Back pain, chronic 07/17/2014    Past Surgical History  Procedure Laterality Date  . Myringotomy with tube placement    . Cyst removal pediatric      Spine    Current Outpatient Rx  Name  Route  Sig  Dispense  Refill  . metaxalone (SKELAXIN) 800 MG tablet   Oral   Take 800 mg by mouth 3 (three) times daily.         Marland Kitchen EXPIRED: escitalopram (LEXAPRO) 10 MG tablet   Oral   Take 5 mg by mouth.          . fluocinonide cream (LIDEX) 0.05 %               . oxyCODONE-acetaminophen (ROXICET) 5-325 MG tablet   Oral   Take 1 tablet by mouth every 6 (six) hours as needed.   20 tablet   0   . predniSONE (DELTASONE) 20 MG tablet      Take 2 tablets daily for 4 days, 1 tab a day for 4 days   12 tablet   0   . PRENATAL 28-0.8 MG TABS   Oral   Take by mouth.            Allergies Penicillins; Aspirin; Naprosyn; and Tramadol  Family History  Problem Relation Age of Onset  . Cancer Mother     breast cancer, ovarian cancer  . Kidney disease Neg Hx   . Bladder Cancer Neg Hx   . Prostate cancer Neg Hx     Social History Social History  Substance Use Topics  . Smoking status: Current Some Day Smoker    Types: Cigarettes  . Smokeless tobacco: Never Used  . Alcohol Use: No    Review of Systems Constitutional: No fever/chills Eyes: No visual changes. ENT: No sore throat. Cardiovascular: Denies chest pain. Respiratory: Denies shortness of breath. Musculoskeletal: hx of chronic back pain. Skin: Negative for rash. Neurological: Negative for headaches, focal weakness or numbness. 10-point ROS otherwise negative.  ____________________________________________   PHYSICAL EXAM:  VITAL SIGNS: ED Triage Vitals  Enc Vitals Group     BP 06/30/15 2245 126/84 mmHg     Pulse Rate 06/30/15 2245 81     Resp 06/30/15 2245 18     Temp 06/30/15 2245 97.8 F (36.6  C)     Temp Source 06/30/15 2245 Oral     SpO2 06/30/15 2245 100 %     Weight 06/30/15 2245 96 lb (43.545 kg)     Height 06/30/15 2245  (1.549 m)     Head Cir --      Peak Flow --      Pain Score 06/30/15 2249 6     Pain Loc --      Pain Edu? --      Excl. in GC? --     Constitutional: Alert and oriented. Well appearing and in no acute distress. Eyes: Conjunctivae are normal. PERRL. EOMI. Ears:  Clear with normal landmarks. No erythema. Head: Atraumatic. Nose: No congestion/rhinnorhea. Mouth/Throat: Mucous membranes are moist.   Musculoskeletal: feet: Bilateral heel pain on exam without erythema or swelling. No Achilles insertion tenderness. No ankle malleoli tenderness, lateral or medial. No tenderness to the distal foot, toes. Neurologic:  Normal speech and language. No gross focal neurologic deficits are appreciated. No gait instability. Skin:  Skin is warm, dry and intact.  No rash noted. Psychiatric: Mood and affect are normal. Speech and behavior are normal.  ____________________________________________   LABS (all labs ordered are listed, but only abnormal results are displayed)  Labs Reviewed - No data to display ____________________________________________  EKG    ____________________________________________  RADIOLOGY    ____________________________________________   PROCEDURES  Procedure(s) performed: None  Critical Care performed: No  ____________________________________________   INITIAL IMPRESSION / ASSESSMENT AND PLAN / ED COURSE  Pertinent labs & imaging results that were available during my care of the patient were reviewed by me and considered in my medical decision making (see chart for details).  30 year old female with history of acute bilateral heel pain consistent with plantar fasciitis, aggravated by increasing her distance and running. Treat with redness on taper and Percocet as needed. She is given a handout on exercises, and stretching. She will follow-up with podiatry for further evaluation. ____________________________________________   FINAL CLINICAL IMPRESSION(S) / ED DIAGNOSES  Final diagnoses:  Plantar fasciitis      Ignacia Bayley, PA-C 06/30/15 2337  Arnaldo Natal, MD 06/30/15 2340

## 2015-07-17 ENCOUNTER — Ambulatory Visit: Payer: BLUE CROSS/BLUE SHIELD | Admitting: Obstetrics and Gynecology

## 2015-07-24 ENCOUNTER — Ambulatory Visit: Payer: BLUE CROSS/BLUE SHIELD | Admitting: Obstetrics and Gynecology

## 2015-08-15 ENCOUNTER — Ambulatory Visit (INDEPENDENT_AMBULATORY_CARE_PROVIDER_SITE_OTHER): Payer: BLUE CROSS/BLUE SHIELD | Admitting: Obstetrics and Gynecology

## 2015-08-15 ENCOUNTER — Encounter: Payer: Self-pay | Admitting: Obstetrics and Gynecology

## 2015-08-15 VITALS — BP 118/82 | HR 83 | Ht 61.0 in | Wt 96.3 lb

## 2015-08-15 DIAGNOSIS — B3731 Acute candidiasis of vulva and vagina: Secondary | ICD-10-CM

## 2015-08-15 DIAGNOSIS — B373 Candidiasis of vulva and vagina: Secondary | ICD-10-CM | POA: Diagnosis not present

## 2015-08-15 DIAGNOSIS — R300 Dysuria: Secondary | ICD-10-CM | POA: Diagnosis not present

## 2015-08-15 LAB — POCT URINALYSIS DIPSTICK
BILIRUBIN UA: NEGATIVE
Glucose, UA: NEGATIVE
KETONES UA: NEGATIVE
LEUKOCYTES UA: NEGATIVE
NITRITE UA: NEGATIVE
PH UA: 6.5
PROTEIN UA: NEGATIVE
RBC UA: NEGATIVE
Spec Grav, UA: 1.015
Urobilinogen, UA: 0.2

## 2015-08-15 MED ORDER — TERCONAZOLE 0.4 % VA CREA: 1.0000 | TOPICAL_CREAM | Freq: Every day | VAGINAL | Status: DC

## 2015-08-15 MED ORDER — FLUCONAZOLE 150 MG PO TABS: 150.0000 mg | ORAL_TABLET | Freq: Every day | ORAL | Status: DC

## 2015-08-15 NOTE — Progress Notes (Signed)
GYN ENCOUNTER NOTE  Subjective:       Kristy Hansen is a 30 y.o. [phone] female is here for gynecologic evaluation of the following issues:  1. Pelvic Pain x 2 weeks. Pain is intermittent and sharp in nature. 6/10.  2. Vaginal Discharge: Thick and White 3. Vaginal Itching   Symptoms began after a recent course of steroids for plantar fascitis   Gynecologic History Patient's last menstrual period was 07/30/2015 (approximate). Contraception: condoms Last Pap: 09/2014. Results were: Normal  Last mammogram: None  Obstetric History OB History  Gravida Para Term Preterm AB SAB TAB Ectopic Multiple Living  4 4 4       4     # Outcome Date GA Lbr Len/2nd Weight Sex Delivery Anes PTL Lv  4 Term 02/07/14    Conception Kristy Hansen Vag-Spont  N Y  3 Term 03/24/10    F Vag-Spont  N Y  2 Term 02/24/08    Kristy Hansen Vag-Spont  N Y  1 Term 01/22/07    Kristy Hansen Vag-Spont   Y      Past Medical History  Diagnosis Date  . Hematuria, gross   . Pyelonephritis   . Renal cyst   . Anxiety   . Autoimmune disease [facility](HCC)     Past Surgical History  Procedure Laterality Date  . Myringotomy with tube placement    . Cyst removal pediatric      Spine    Current Outpatient Prescriptions on File Prior to Visit  Medication Sig Dispense Refill  . escitalopram (LEXAPRO) 10 MG tablet Take 5 mg by mouth.     . metaxalone (SKELAXIN) 800 MG tablet Take 800 mg by mouth 3 (three) times daily.     No current facility-administered medications on file prior to visit.    Allergies  Allergen Reactions  . Penicillins Shortness Of Breath and Swelling  . Aspirin Hives  . Naprosyn [Naproxen] Hives  . Tramadol Hives    Social History   Social History  . Marital Status: Married    Spouse Name: N/A  . Number of Children: N/A  . Years of Education: N/A   Occupational History  . Not on file.   Social History Main Topics  . Smoking status: Current Some Day Smoker -- 0.25 packs/day    Types: Cigarettes  . Smokeless tobacco: Never Used   . Alcohol Use: 0.0 oz/week    0 Standard drinks or equivalent per week     Comment: occas  . Drug Use: No  . Sexual Activity: Yes    Birth Control/ Protection: Condom   Other Topics Concern  . Not on file   Social History Narrative    Family History  Problem Relation Age of Onset  . Cancer Mother     breast cancer, ovarian cancer  . Kidney disease Neg Hx   . Bladder Cancer Neg Hx   . Prostate cancer Neg Hx   . Diabetes Maternal Grandmother     The following portions of the patient's history were reviewed and updated as appropriate: allergies, current medications, past family history, past medical history, past social history, past surgical history and problem list.  Review of Systems Review of Systems - General ROS: negative for - chills, fatigue, fever, hot flashes, malaise or night sweats Hematological and Lymphatic ROS: negative for - bleeding problems or swollen lymph nodes Gastrointestinal ROS: negative for - abdominal pain, blood in stools, change in bowel habits and nausea/vomiting Musculoskeletal ROS: negative for - joint pain, muscle pain  or muscular weakness Genito-Urinary ROS: Positive for dysuria, pelvic pain, and genital discharge negative for - change in menstrual cycle, dysmenorrhea, dyspareunia, genital ulcers, hematuria, incontinence, irregular/heavy menses, or nocturia   Objective:   BP 118/82 mmHg  Pulse 83  Ht  (1.549 m)  Wt 96 lb 4.8 oz (43.681 kg)  BMI 18.20 kg/m2  LMP 07/30/2015 (Approximate)  Breastfeeding? No CONSTITUTIONAL: Well-developed, well-nourished female in no acute distress.  HENT:  Normocephalic, atraumatic.  NECK: Normal range of motion, supple, no masses.  Normal thyroid.  SKIN: Skin is warm and dry. No rash noted. Not diaphoretic. No erythema. No pallor. NEUROLGIC: Alert and oriented to person, place, and time. PSYCHIATRIC: Normal mood and affect. Normal behavior. Normal judgment and thought content. CARDIOVASCULAR:Not  Examined RESPIRATORY: Not Examined BREASTS: Not Examined ABDOMEN: Soft, non distended; Non tender.  No Organomegaly. PELVIC:  External Genitalia: Normal; no significant erythema or lesion  BUS: Normal  Vagina: Normal; mild amount of white discharge   Cervix: Normal  Uterus: Normal size, shape,consistency, mobile  Adnexa: Normal  RV: Normal external genitalia   Bladder: Nontender MUSCULOSKELETAL: Normal range of motion. No tenderness.  No cyanosis, clubbing, or edema.  Procedure: Wet Prep  NS: WBCs mild to moderate, no clue cells, no trichomonas, normal epithelial cells  KOH: Hyphae present    Assessment:   1. Dysuria POCT urinalysis dipstick: Normal Urine culture: Pending   2. Monilial vaginitis Mild white discharge.  Hyphae Present on KOH     Plan:  1. Urine C&S Sent 2. Diflucan 150 mg Tablet and Terazol 7 0.4% Vaginal Cream  3.  Follow up as needed if symptoms persist  Avie Arenas PA-S Herold Harms, MD   I have seen, interviewed, and examined the patient in conjunction with the [facility].A. student and affirm the diagnosis and management plan. Kristy Hansen A. Kristy Panetta, MD, FACOG   Note: This dictation was prepared with Dragon dictation along with smaller phrase technology. Any transcriptional errors that result from this process are unintentional.

## 2015-08-15 NOTE — Patient Instructions (Signed)
1. Diflucan 150 mg orally for one dose 2. Terazol cream intravaginal for 7 days 3. Return as needed

## 2015-08-16 LAB — URINE CULTURE

## 2015-09-08 ENCOUNTER — Emergency Department
Admission: EM | Admit: 2015-09-08 | Discharge: 2015-09-08 | Disposition: A | Discharge status: Home / Self Care | Payer: BLUE CROSS/BLUE SHIELD | Attending: Emergency Medicine | Admitting: Emergency Medicine

## 2015-09-08 DIAGNOSIS — R1030 Lower abdominal pain, unspecified: Secondary | ICD-10-CM

## 2015-09-08 DIAGNOSIS — Z3202 Encounter for pregnancy test, result negative: Secondary | ICD-10-CM | POA: Insufficient documentation

## 2015-09-08 DIAGNOSIS — Z79899 Other long term (current) drug therapy: Secondary | ICD-10-CM | POA: Insufficient documentation

## 2015-09-08 DIAGNOSIS — F17210 Nicotine dependence, cigarettes, uncomplicated: Secondary | ICD-10-CM | POA: Insufficient documentation

## 2015-09-08 DIAGNOSIS — Z880 Allergy status to penicillin: Secondary | ICD-10-CM | POA: Insufficient documentation

## 2015-09-08 LAB — URINALYSIS COMPLETE WITH MICROSCOPIC (ARMC ONLY)
BACTERIA UA: NONE SEEN
Bilirubin Urine: NEGATIVE
Glucose, UA: NEGATIVE mg/dL
Hgb urine dipstick: NEGATIVE
Ketones, ur: NEGATIVE mg/dL
Leukocytes, UA: NEGATIVE
Nitrite: NEGATIVE
PH: 6 (ref 5.0–8.0)
PROTEIN: NEGATIVE mg/dL
SPECIFIC GRAVITY, URINE: 1.023 (ref 1.005–1.030)

## 2015-09-08 LAB — PREGNANCY, URINE: PREG TEST UR: NEGATIVE

## 2015-09-08 NOTE — ED Notes (Signed)
Pt c/o lower abd pain and back pain x3 days. Hx UTI with recurrent symptoms. Reports similar symptoms to past.

## 2015-09-08 NOTE — ED Provider Notes (Signed)
[facility] Emergency Department Provider Note  ____________________________________________  Time seen: Approximately 3:53 PM  I have reviewed the triage vital signs and the nursing notes.   HISTORY  Chief Complaint Abdominal Pain    HPI Kristy Hansen Pain is a 30 y.o. female who presents emergency department complaining of bilateral lower abdominal pain 3 days. Patient states that she does have a history of recurring UTIs but has not experienced any polyuria, dysuria, hematuria. Patient denies that the symptoms are consistent with previous urinary tract infections. Patient denies any fevers or chills, nausea or vomiting, diarrhea or constipation. Patient denies any vaginal discharge. Patient states that the pain is best described as a intermittent cramping sensation and bilateral lower quadrants. Patient states that she is able to eat and drink appropriately.   Past Medical History  Diagnosis Date  . Hematuria, gross   . Pyelonephritis   . Renal cyst   . Anxiety   . Autoimmune disease [facility])     Patient Active Problem List   Diagnosis Date Noted  . Renal cyst 01/05/2015  . Paresthesias 12/12/2014  . Family history of ovarian cancer 12/12/2014  . Tobacco user 12/12/2014  . Renal mass 12/05/2014  . Anxiety 10/11/2014  . Back pain, chronic 07/17/2014    Past Surgical History  Procedure Laterality Date  . Myringotomy with tube placement    . Cyst removal pediatric      Spine    Current Outpatient Rx  Name  Route  Sig  Dispense  Refill  . EXPIRED: escitalopram (LEXAPRO) 10 MG tablet   Oral   Take 5 mg by mouth.          . fluconazole (DIFLUCAN) 150 MG tablet   Oral   Take 1 tablet (150 mg total) by mouth daily.   1 tablet   0   . loratadine (SM LORATADINE) 5 MG/5ML syrup   Oral   Take by mouth.         . metaxalone (SKELAXIN) 800 MG tablet   Oral   Take 800 mg by mouth 3 (three) times daily.         Marland Kitchen terconazole (TERAZOL 7)  0.4 % vaginal cream   Vaginal   Place 1 applicator vaginally at bedtime.   45 g   0     Allergies Penicillins; Aspirin; Naprosyn; and Tramadol  Family History  Problem Relation Age of Onset  . Cancer Mother     breast cancer, ovarian cancer  . Kidney disease Neg Hx   . Bladder Cancer Neg Hx   . Prostate cancer Neg Hx   . Diabetes Maternal Grandmother     Social History Social History  Substance Use Topics  . Smoking status: Current Some Day Smoker -- 0.25 packs/day    Types: Cigarettes  . Smokeless tobacco: Never Used  . Alcohol Use: 0.0 oz/week    0 Standard drinks or equivalent per week     Comment: occas     Review of Systems  Constitutional: No fever/chills Cardiovascular: no chest pain. Respiratory: no cough. No SOB. Gastrointestinal: Positive for bilateral lower quadrant abdominal pain.  No nausea, no vomiting.  No diarrhea.  No constipation. Genitourinary: Negative for dysuria. No polyuria. No hematuria Musculoskeletal: Negative for back pain. Skin: Negative for rash. Neurological: Negative for headaches, focal weakness or numbness. 10-point ROS otherwise negative.  ____________________________________________   PHYSICAL EXAM:  VITAL SIGNS: ED Triage Vitals  Enc Vitals Group     BP 09/08/15 1507  133/92 mmHg     Pulse Rate 09/08/15 1507 76     Resp 09/08/15 1507 16     Temp 09/08/15 1507 98.3 F (36.8 C)     Temp Source 09/08/15 1507 Oral     SpO2 09/08/15 1507 98 %     Weight 09/08/15 1507 96 lb (43.545 kg)     Height 09/08/15 1507 5\' 1"  (1.549 m)     Head Cir --      Peak Flow --      Pain Score 09/08/15 1508 4     Pain Loc --      Pain Edu? --      Excl. in GC? --      Constitutional: Alert and oriented. Well appearing and in no acute distress. Eyes: Conjunctivae are normal. PERRL. EOMI. Head: Atraumatic. ENT:      Mouth/Throat: Mucous membranes are moist.  Neck: No stridor.   Hematological/Lymphatic/Immunilogical: No cervical  lymphadenopathy. Cardiovascular: Normal rate, regular rhythm. Normal S1 and S2.  Good peripheral circulation. Respiratory: Normal respiratory effort without tachypnea or retractions. Lungs CTAB. Gastrointestinal: Bowel sounds 4 quadrants. Soft and nontender to palpation. No guarding or rigidity. No palpable masses.. No distention. No CVA tenderness. Musculoskeletal: Full range of motion all extremities. Neurologic:  Normal speech and language. No gross focal neurologic deficits are appreciated.  Skin:  Skin is warm, dry and intact. No rash noted. Psychiatric: Mood and affect are normal. Speech and behavior are normal. Patient exhibits appropriate insight and judgement.   ____________________________________________   LABS (all labs ordered are listed, but only abnormal results are displayed)  Labs Reviewed  URINALYSIS COMPLETEWITH MICROSCOPIC ([facility] ONLY) - Abnormal; Notable for the following:    Color, Urine YELLOW (*)    APPearance CLOUDY (*)    Squamous Epithelial / LPF 0-5 (*)    All other components within normal limits  PREGNANCY, URINE   ____________________________________________  EKG   ____________________________________________  RADIOLOGY   No results found.  ____________________________________________    PROCEDURES  Procedure(s) performed:       Medications - No data to display   ____________________________________________   INITIAL IMPRESSION / ASSESSMENT AND PLAN / ED COURSE  Pertinent labs & imaging results that were available during my care of the patient were reviewed by me and considered in my medical decision making (see chart for details).  Patient's diagnosis is consistent with Generalized abdominal pain. Patient's urinalysis is reassuring pregnancy test is negative. Exam is reassuring. At this time no definitive diagnosis. Patient is offered labs and imaging for further analysis. Patient declined at this time stating "I don't want to  wait around in the hospital that long." At this time patient is well-appearing in no acute distress and exam is reassuring. Patient will be discharged with instructions to take Tylenol and ibuprofen for symptom control and to follow up with primary care/OB/GYN or at this department should symptoms change or worsen..      ____________________________________________  FINAL CLINICAL IMPRESSION(S) / ED DIAGNOSES  Final diagnoses:  Lower abdominal pain      NEW MEDICATIONS STARTED DURING THIS VISIT:  New Prescriptions   No medications on file        This chart was dictated using voice recognition software/Dragon. Despite best efforts to proofread, errors can occur which can change the meaning. Any change was purely unintentional.    Racheal PatchesJonathan D Cuthriell, PA-C 09/08/15 1603  Jene Everyobert Kinner, MD 09/08/15 [phone]

## 2015-09-08 NOTE — Discharge Instructions (Signed)

## 2015-10-07 ENCOUNTER — Emergency Department
Admission: EM | Admit: 2015-10-07 | Discharge: 2015-10-07 | Disposition: A | Discharge status: Home / Self Care | Payer: BLUE CROSS/BLUE SHIELD | Attending: Emergency Medicine | Admitting: Emergency Medicine

## 2015-10-07 ENCOUNTER — Emergency Department: Payer: BLUE CROSS/BLUE SHIELD

## 2015-10-07 ENCOUNTER — Encounter: Payer: Self-pay | Admitting: Emergency Medicine

## 2015-10-07 DIAGNOSIS — S62619A Displaced fracture of proximal phalanx of unspecified finger, initial encounter for closed fracture: Secondary | ICD-10-CM

## 2015-10-07 DIAGNOSIS — W010XXA Fall on same level from slipping, tripping and stumbling without subsequent striking against object, initial encounter: Secondary | ICD-10-CM | POA: Insufficient documentation

## 2015-10-07 DIAGNOSIS — Y999 Unspecified external cause status: Secondary | ICD-10-CM | POA: Insufficient documentation

## 2015-10-07 DIAGNOSIS — Y9389 Activity, other specified: Secondary | ICD-10-CM | POA: Insufficient documentation

## 2015-10-07 DIAGNOSIS — Z880 Allergy status to penicillin: Secondary | ICD-10-CM | POA: Insufficient documentation

## 2015-10-07 DIAGNOSIS — S62609A Fracture of unspecified phalanx of unspecified finger, initial encounter for closed fracture: Secondary | ICD-10-CM

## 2015-10-07 DIAGNOSIS — S62640A Nondisplaced fracture of proximal phalanx of right index finger, initial encounter for closed fracture: Secondary | ICD-10-CM | POA: Insufficient documentation

## 2015-10-07 DIAGNOSIS — F17210 Nicotine dependence, cigarettes, uncomplicated: Secondary | ICD-10-CM | POA: Insufficient documentation

## 2015-10-07 DIAGNOSIS — Y929 Unspecified place or not applicable: Secondary | ICD-10-CM | POA: Insufficient documentation

## 2015-10-07 NOTE — Discharge Instructions (Signed)
Finger Fracture Fractures of fingers are breaks in the bones of the fingers. There are many types of fractures. There are different ways of treating these fractures. Your health care provider will discuss the best way to treat your fracture. CAUSES Traumatic injury is the main cause of broken fingers. These include:  Injuries while playing sports.  Workplace injuries.  Falls. RISK FACTORS Activities that can increase your risk of finger fractures include:  Sports.  Workplace activities that involve machinery.  A condition called osteoporosis, which can make your bones less dense and cause them to fracture more easily. SIGNS AND SYMPTOMS The main symptoms of a broken finger are pain and swelling within 15 minutes after the injury. Other symptoms include:  Bruising of your finger.  Stiffness of your finger.  Numbness of your finger.  Exposed bones (compound fracture) if the fracture is severe. DIAGNOSIS  The best way to diagnose a broken bone is with X-ray imaging. Additionally, your health care provider will use this X-ray image to evaluate the position of the broken finger bones.  TREATMENT  Finger fractures can be treated with:   Nonreduction--This means the bones are in place. The finger is splinted without changing the positions of the bone pieces. The splint is usually left on for about a week to 10 days. This will depend on your fracture and what your health care provider thinks.  Closed reduction--The bones are put back into position without using surgery. The finger is then splinted.  Open reduction and internal fixation--The fracture site is opened. Then the bone pieces are fixed into place with pins or some type of hardware. This is seldom required. It depends on the severity of the fracture. HOME CARE INSTRUCTIONS   Follow your health care provider's instructions regarding activities, exercises, and physical therapy.  Only take over-the-counter or prescription  medicines for pain, discomfort, or fever as directed by your health care provider. SEEK MEDICAL CARE IF: You have pain or swelling that limits the motion or use of your fingers. SEEK IMMEDIATE MEDICAL CARE IF:  Your finger becomes numb. MAKE SURE YOU:   Understand these instructions.  Will watch your condition.  Will get help right away if you are not doing well or get worse.   This information is not intended to replace advice given to you by your health care provider. Make sure you discuss any questions you have with your health care provider.   Document Released: 09/07/2000 Document Revised: 03/16/2013 Document Reviewed: 01/05/2013 Elsevier Interactive Patient Education 2016 [place] or Splint Care Casts and splints support injured limbs and keep bones from moving while they heal.  HOME CARE  Keep the cast or splint uncovered during the drying period.  A plaster cast can take 24 to 48 hours to dry.  A fiberglass cast will dry in less than 1 hour.  Do not rest the cast on anything harder than a pillow for 24 hours.  Do not put weight on your injured limb. Do not put pressure on the cast. Wait for your doctor's approval.  Keep the cast or splint dry.  Cover the cast or splint with a plastic bag during baths or wet weather.  If you have a cast over your chest and belly (trunk), take sponge baths until the cast is taken off.  If your cast gets wet, dry it with a towel or blow dryer. Use the cool setting on the blow dryer.  Keep your cast or splint clean. Wash a  dirty cast with a damp cloth.  Do not put any objects under your cast or splint.  Do not scratch the skin under the cast with an object. If itching is a problem, use a blow dryer on a cool setting over the itchy area.  Do not trim or cut your cast.  Do not take out the padding from inside your cast.  Exercise your joints near the cast as told by your doctor.  Raise (elevate) your injured limb on 1  or 2 pillows for the first 1 to 3 days. GET HELP IF:  Your cast or splint cracks.  Your cast or splint is too tight or too loose.  You itch badly under the cast.  Your cast gets wet or has a soft spot.  You have a bad smell coming from the cast.  You get an object stuck under the cast.  Your skin around the cast becomes red or sore.  You have new or more pain after the cast is put on. GET HELP RIGHT AWAY IF:  You have fluid leaking through the cast.  You cannot move your fingers or toes.  Your fingers or toes turn blue or white or are cool, painful, or puffy (swollen).  You have tingling or lose feeling (numbness) around the injured area.  You have bad pain or pressure under the cast.  You have trouble breathing or have shortness of breath.  You have chest pain.   This information is not intended to replace advice given to you by your health care provider. Make sure you discuss any questions you have with your health care provider.   Document Released: 09/25/2010 Document Revised: 01/26/2013 Document Reviewed: 12/02/2012 Elsevier Interactive Patient Education 2016 [employer]   You have a small fracture at the base of the index finger. Wear the splint for support until evaluated by ortho. Call Monday to schedule an appointment within 7-10 days. Apply ice over the splint for swelling.

## 2015-10-07 NOTE — ED Notes (Signed)
Jenise informed of patients blood pressure, will monitor patient for 10 minutes and then re check blood pressure

## 2015-10-07 NOTE — ED Notes (Signed)
Patient states that last night she tripped over her dog and fell, patient tried to catch herself and landed on her right hand. Patient is now having pain in her pain and swelling in her right index finger, patient states that she is unable to straighten her finger out.

## 2015-10-07 NOTE — ED Notes (Signed)
Patient came out of room stating that she wanted to leave because she felt fine. Patient denies any headache, chest pain, or trouble breathing. Patient states that her blood pressure goes up and down and that she is not concerned about it at this time. Jenise aware.

## 2015-10-07 NOTE — ED Provider Notes (Signed)
[facility] Emergency Department Provider Note ____________________________________________  Time seen: 0918  I have reviewed the triage vital signs and the nursing notes.  HISTORY  Chief Complaint  Hand Pain   HPI Kristy Hansen is a 30 y.o. female right-hand-dominant, reports to the ED for evaluation of pain to the base of her right index finger. She describes falling last night after she tripped over her dog, and somehow landed on her right hand causing immediate pain to the palmar knuckle. She reports today decreased ability to extend the finger fully without pain. She denies any other injury at this time she applied ice at home but took no medications to alleviate her pain or swelling. The finger is stiff and throbbing in nature.She reports a discomfort to her hand at a 7/10 in triage.  Past Medical History  Diagnosis Date  . Hematuria, gross   . Pyelonephritis   . Renal cyst   . Anxiety   . Autoimmune disease [facility](HCC)     Patient Active Problem List   Diagnosis Date Noted  . Renal cyst 01/05/2015  . Paresthesias 12/12/2014  . Family history of ovarian cancer 12/12/2014  . Tobacco user 12/12/2014  . Renal mass 12/05/2014  . Anxiety 10/11/2014  . Back pain, chronic 07/17/2014    Past Surgical History  Procedure Laterality Date  . Myringotomy with tube placement    . Cyst removal pediatric      Spine    Current Outpatient Rx  Name  Route  Sig  Dispense  Refill  . EXPIRED: escitalopram (LEXAPRO) 10 MG tablet   Oral   Take 5 mg by mouth.          . fluconazole (DIFLUCAN) 150 MG tablet   Oral   Take 1 tablet (150 mg total) by mouth daily.   1 tablet   0   . loratadine (SM LORATADINE) 5 MG/5ML syrup   Oral   Take by mouth.         . metaxalone (SKELAXIN) 800 MG tablet   Oral   Take 800 mg by mouth 3 (three) times daily.         Marland Kitchen. terconazole (TERAZOL 7) 0.4 % vaginal cream   Vaginal   Place 1 applicator vaginally at  bedtime.   45 g   0    Allergies Penicillins; Aspirin; Naprosyn; and Tramadol  Family History  Problem Relation Age of Onset  . Cancer Mother     breast cancer, ovarian cancer  . Kidney disease Neg Hx   . Bladder Cancer Neg Hx   . Prostate cancer Neg Hx   . Diabetes Maternal Grandmother    Social History Social History  Substance Use Topics  . Smoking status: Current Some Day Smoker -- 0.25 packs/day    Types: Cigarettes  . Smokeless tobacco: Never Used  . Alcohol Use: 0.0 oz/week    0 Standard drinks or equivalent per week     Comment: occas   Review of Systems  Constitutional: Negative for fevers, chills, sweats.  Musculoskeletal: Negative for back pain. Right index finger pain as above. Skin: Negative for rash. Neurological: Negative for headaches, focal weakness or numbness. ____________________________________________  PHYSICAL EXAM:  VITAL SIGNS: ED Triage Vitals  Enc Vitals Group     BP 10/07/15 0812 127/99 mmHg     Pulse Rate 10/07/15 0812 82     Resp 10/07/15 0812 18     Temp 10/07/15 0812 98.9 F (37.2 C)  Temp Source 10/07/15 0812 Oral     SpO2 10/07/15 0812 100 %     Weight 10/07/15 0812 100 lb (45.36 kg)     Height 10/07/15 0812  (1.549 m)     Head Cir --      Peak Flow --      Pain Score 10/07/15 0812 7     Pain Loc --      Pain Edu? --      Excl. in GC? --    Constitutional: Alert and oriented. Well appearing and in no distress. Head: Normocephalic and atraumatic. Cardiovascular: Normal distal pulses and cap refill. Respiratory: Normal respiratory effort.  Musculoskeletal: Right hand without obvious deformity, swelling, or dislocation to the fingers. She is with some mild erythema and tenderness to the dorsal MCP of the index finger. Patient is tender to palpation over the MCP as well. She has normal flexion range but decreased extension and the index secondary to pain. She is nontender to palpation at the DIP and PIPs. Nontender with  normal range of motion in all other extremities.  Neurologic:  Normal gross sensation and normal intrinsics noted. Normal gait without ataxia. Normal speech and language. No gross focal neurologic deficits are appreciated. Skin:  Skin is warm, dry and intact. No rash noted. No cut, scrape, abrasion, or ecchymosis noted to the hand ____________________________________________   RADIOLOGY Right Index  IMPRESSION: Nondisplaced fracture involving the base of the proximal phalanx of the index finger. ____________________________________________  PROCEDURES  Gutter splint - index finger ____________________________________________  INITIAL IMPRESSION / ASSESSMENT AND PLAN / ED COURSE  Patient with a nondisplaced fracture to the base of the proximal phalanx of the right index finger. She placed in a finger gutter splint for support. She will follow-up with orthopedics in 1 week for reevaluation. She will dosed ibuprofen or Naprosyn as needed for pain and inflammation. Activities are limited by the splint applied to the right hand.  ____________________________________________  FINAL CLINICAL IMPRESSION(S) / ED DIAGNOSES  Final diagnoses:  Finger fracture, right, closed, initial encounter  Proximal phalanx fracture of finger, closed, initial encounter      Kristy Hoard, PA-C 10/07/15 1000  Minna Antis, MD 10/07/15 1506

## 2015-10-09 ENCOUNTER — Telehealth: Payer: Self-pay | Admitting: Obstetrics and Gynecology

## 2015-10-09 NOTE — Telephone Encounter (Signed)
She chose Thursday @1145 

## 2015-10-09 NOTE — Telephone Encounter (Signed)
Pt said she has had 6 yeast infections over the last yr. She wanted an appt w/ Dr Tommi Rumpse there is mothing this week or the next unless we have a cancellation.

## 2015-10-11 ENCOUNTER — Ambulatory Visit (INDEPENDENT_AMBULATORY_CARE_PROVIDER_SITE_OTHER): Payer: BLUE CROSS/BLUE SHIELD | Admitting: Obstetrics and Gynecology

## 2015-10-11 ENCOUNTER — Encounter: Payer: Self-pay | Admitting: Obstetrics and Gynecology

## 2015-10-11 VITALS — BP 123/81 | HR 91 | Ht 61.0 in | Wt 96.7 lb

## 2015-10-11 DIAGNOSIS — R300 Dysuria: Secondary | ICD-10-CM | POA: Diagnosis not present

## 2015-10-11 DIAGNOSIS — B3731 Acute candidiasis of vulva and vagina: Secondary | ICD-10-CM

## 2015-10-11 DIAGNOSIS — B373 Candidiasis of vulva and vagina: Secondary | ICD-10-CM | POA: Diagnosis not present

## 2015-10-11 LAB — POCT URINALYSIS DIPSTICK
Bilirubin, UA: NEGATIVE
Blood, UA: NEGATIVE
Glucose, UA: NEGATIVE
Ketones, UA: NEGATIVE
LEUKOCYTES UA: NEGATIVE
NITRITE UA: NEGATIVE
PH UA: 6
PROTEIN UA: NEGATIVE
Spec Grav, UA: 1.02
UROBILINOGEN UA: 0.2

## 2015-10-11 MED ORDER — FLUCONAZOLE 150 MG PO TABS: 150.0000 mg | ORAL_TABLET | Freq: Every day | ORAL | Status: DC

## 2015-10-11 NOTE — Progress Notes (Signed)
Patient ID: Kristy Hansen, female   DOB: 03/06/1986, 30 y.o.   MRN: [id number] GYN ENCOUNTER NOTE  Subjective:       Kristy Hansen is a 30 y.o. [phone] female is here for gynecologic evaluation of the following issues:  1. Recurrent yeast infections. Has had 4 previous this year, treated with Terazol cream, Diflucan, and Monistat. All resolved but then symptoms returned. Has been symptomatic x5 days with vaginal itching and pain, cramping. Denies discharge, odor, spotting in the last month.  2. Dysuria. Denies Increased frequency or urge, hematuria.   Gynecologic History Patient's last menstrual period was 09/18/2015 (approximate). Contraception: condoms  Obstetric History OB History  Gravida Para Term Preterm AB SAB TAB Ectopic Multiple Living  4 4 4       4     # Outcome Date GA Lbr Len/2nd Weight Sex Delivery Anes PTL Lv  4 Term 02/07/14    F Vag-Spont  N Y  3 Term 03/24/10    F Vag-Spont  N Y  2 Term 02/24/08    [name] Vag-Spont  N Y  1 Term 01/22/07    [name] Vag-Spont   Y      Past Medical History  Diagnosis Date  . Hematuria, gross   . Pyelonephritis   . Renal cyst   . Anxiety   . Autoimmune disease [facility](HCC)     Past Surgical History  Procedure Laterality Date  . Myringotomy with tube placement    . Cyst removal pediatric      Spine    Current Outpatient Prescriptions on File Prior to Visit  Medication Sig Dispense Refill  . escitalopram (LEXAPRO) 10 MG tablet Take 5 mg by mouth.     . metaxalone (SKELAXIN) 800 MG tablet Take 800 mg by mouth 3 (three) times daily.     No current facility-administered medications on file prior to visit.    Allergies  Allergen Reactions  . Penicillins Shortness Of Breath and Swelling  . Aspirin Hives  . Naprosyn [Naproxen] Hives  . Tramadol Hives    Social History   Social History  . Marital Status: Married    Spouse Name: N/A  . Number of Children: N/A  . Years of Education: N/A   Occupational History  . Not on file.    Social History Main Topics  . Smoking status: Current Some Day Smoker -- 0.25 packs/day    Types: Cigarettes  . Smokeless tobacco: Never Used  . Alcohol Use: 0.0 oz/week    0 Standard drinks or equivalent per week     Comment: occas  . Drug Use: No  . Sexual Activity: Yes    Birth Control/ Protection: None   Other Topics Concern  . Not on file   Social History Narrative    Family History  Problem Relation Age of Onset  . Cancer Mother     breast cancer, ovarian cancer  . Kidney disease Neg Hx   . Bladder Cancer Neg Hx   . Prostate cancer Neg Hx   . Diabetes Maternal Grandmother     The following portions of the patient's history were reviewed and updated as appropriate: allergies, current medications, past family history, past medical history, past social history, past surgical history and problem list.  Review of Systems Review of Systems - Negative except as mentioned in HPI Review of Systems - General ROS: negative for - chills, fatigue, fever, hot flashes, malaise or night sweats Hematological and Lymphatic ROS: negative for - bleeding  problems or swollen lymph nodes Gastrointestinal ROS: negative for - abdominal pain, blood in stools, change in bowel habits and nausea/vomiting Musculoskeletal ROS: negative for - joint pain, muscle pain or muscular weakness Genito-Urinary ROS: negative for - change in menstrual cycle, dysmenorrhea, genital discharge, genital ulcers, hematuria, incontinence, irregular/heavy menses, nocturia  Objective:   BP 123/81 mmHg  Pulse 91  Ht  (1.549 m)  Wt 96 lb 11.2 oz (43.863 kg)  BMI 18.28 kg/m2  LMP 09/18/2015 (Approximate)  Breastfeeding? No CONSTITUTIONAL: Well-developed, well-nourished female in no acute distress.  HENT:  Normocephalic, atraumatic.  NECK: Normal range of motion, supple, no masses.  Normal thyroid.  SKIN: Skin is warm and dry. No rash noted. Not diaphoretic. No erythema. No pallor. NEUROLGIC: Alert and  oriented to person, place, and time. PSYCHIATRIC: Normal mood and affect. Normal behavior. Normal judgment and thought content. CARDIOVASCULAR:Not Examined RESPIRATORY: Not Examined BREASTS: Not Examined ABDOMEN: Soft, non distended; Non tender.  No Organomegaly. PELVIC:  External Genitalia: Normal  BUS: Normal  Vagina: Normal  Cervix: Normal, small amount of whitish discharge  Uterus: Not Examined  Adnexa: Not Examined  RV: Not Examined  Bladder: Normal MUSCULOSKELETAL: Normal range of motion. No tenderness.  No cyanosis, clubbing, or edema.  PROCEDURE: Wet Prep/Nu swab  KOH-Hyphae  NS-negative for Trich, WBC, Clue cells    Assessment:   1. Dysuria - Urine culture - POCT urinalysis dipstick - negative   2. Monilia Vaginitis - seen on KOH wet prep - NuSwab   Plan:   1. Diflucan weekly x4 wks. 2. Follow up as needed  Tresa Endo Rayburn PA-S Herold Harms, MD   I have seen, interviewed, and examined the patient in conjunction with the [facility].A. student and affirm the diagnosis and management plan. [name], MD, FACOG   Note: This dictation was prepared with Dragon dictation along with smaller phrase technology. Any transcriptional errors that result from this process are unintentional.

## 2015-10-12 LAB — URINE CULTURE

## 2015-10-12 NOTE — Patient Instructions (Signed)
1. Diflucan weekly x4 wks. 2. Follow up as needed

## 2015-10-13 LAB — NUSWAB VAGINITIS PLUS (VG+)
Candida albicans, NAA: POSITIVE — AB
Candida glabrata, NAA: NEGATIVE
Chlamydia trachomatis, NAA: NEGATIVE
Neisseria gonorrhoeae, NAA: NEGATIVE
TRICH VAG BY NAA: NEGATIVE

## 2016-05-11 ENCOUNTER — Emergency Department
Admission: EM | Admit: 2016-05-11 | Discharge: 2016-05-11 | Disposition: A | Discharge status: Home / Self Care | Payer: Medicaid Other | Attending: Emergency Medicine | Admitting: Emergency Medicine

## 2016-05-11 ENCOUNTER — Encounter: Payer: Self-pay | Admitting: Emergency Medicine

## 2016-05-11 DIAGNOSIS — N643 Galactorrhea not associated with childbirth: Secondary | ICD-10-CM

## 2016-05-11 DIAGNOSIS — O926 Galactorrhea: Secondary | ICD-10-CM | POA: Insufficient documentation

## 2016-05-11 DIAGNOSIS — F17210 Nicotine dependence, cigarettes, uncomplicated: Secondary | ICD-10-CM | POA: Insufficient documentation

## 2016-05-11 DIAGNOSIS — Z79899 Other long term (current) drug therapy: Secondary | ICD-10-CM | POA: Insufficient documentation

## 2016-05-11 HISTORY — DX: Compression of brain: G93.5

## 2016-05-11 LAB — POCT PREGNANCY, URINE: PREG TEST UR: NEGATIVE

## 2016-05-11 NOTE — ED Triage Notes (Signed)
Pt states she is having white milk like drainage from her left breast nipple. Pt states she has felt tired "lately" and is unsure of her pregnancy status. Pt denies other symptoms and appears in no acute distress.

## 2016-05-11 NOTE — ED Provider Notes (Signed)
[facility] Emergency Department Provider Note  ____________________________________________  Time seen: Approximately 10:19 PM  I have reviewed the triage vital signs and the nursing notes.   HISTORY  Chief Complaint Wound Check    HPI Kristy Hansen is a 30 y.o. female that presents with 1 day of white drainage from left breast. She states the drainage was minimal and has never happened before. Patient has some breast sensitivity bilaterally. Patient denies any rash. Patient denies fever.  Patients last menstrual period was in the middle of November. Patient is not on birth control.Patient has not had any medication changes.   Past Medical History:  Diagnosis Date  . Anxiety   . Autoimmune disease (HCC)   . Chiari malformation type I (HCC)   . Hematuria, gross   . Pyelonephritis   . Renal cyst     Patient Active Problem List   Diagnosis Date Noted  . Renal cyst 01/05/2015  . Paresthesias 12/12/2014  . Family history of ovarian cancer 12/12/2014  . Tobacco user 12/12/2014  . Renal mass 12/05/2014  . Anxiety 10/11/2014  . Back pain, chronic 07/17/2014    Past Surgical History:  Procedure Laterality Date  . CYST REMOVAL PEDIATRIC     Spine  . MYRINGOTOMY WITH TUBE PLACEMENT      Prior to Admission medications   Medication Sig Start Date End Date Taking? Authorizing Provider  escitalopram (LEXAPRO) 10 MG tablet Take 5 mg by mouth.  10/11/14 10/11/15  Historical Provider, MD  fluconazole (DIFLUCAN) 150 MG tablet Take 1 tablet (150 mg total) by mouth daily. One tablet weekly x 4 weeks 10/11/15   Prentice DockerMartin A Defrancesco, MD  fluticasone (FLONASE) 50 MCG/ACT nasal spray SHAKE LQ AND U 1 SPR IEN QD 08/22/15   Historical Provider, MD  metaxalone (SKELAXIN) 800 MG tablet Take 800 mg by mouth 3 (three) times daily.    Historical Provider, MD  montelukast (SINGULAIR) 10 MG tablet Take by mouth. 09/10/15 09/09/16  Historical Provider, MD     Allergies Penicillins; Aspirin; Naprosyn [naproxen]; and Tramadol  Family History  Problem Relation Age of Onset  . Cancer Mother     breast cancer, ovarian cancer  . Diabetes Maternal Grandmother   . Kidney disease Neg Hx   . Bladder Cancer Neg Hx   . Prostate cancer Neg Hx     Social History Social History  Substance Use Topics  . Smoking status: Current Some Day Smoker    Packs/day: 0.25    Types: Cigarettes  . Smokeless tobacco: Never Used  . Alcohol use 0.0 oz/week     Comment: occas     Review of Systems  Constitutional: No fever/chills ENT: No upper respiratory complaints. Cardiovascular: no chest pain. Respiratory: no cough. No SOB. Gastrointestinal: No abdominal pain.  No nausea, no vomiting.   Genitourinary: Negative for dysuria. No hematuria. No urgency or frequency.  Musculoskeletal: Negative for musculoskeletal pain.  Skin: Negative for rash, abrasions, lacerations, ecchymosis.   ____________________________________________   PHYSICAL EXAM:  VITAL SIGNS: ED Triage Vitals  Enc Vitals Group     BP 05/11/16 2111 (!) 148/86     Pulse Rate 05/11/16 2111 92     Resp 05/11/16 2111 16     Temp 05/11/16 2111 98.4 F (36.9 C)     Temp Source 05/11/16 2111 Oral     SpO2 05/11/16 2111 100 %     Weight 05/11/16 2112 96 lb (43.5 kg)     Height 05/11/16 2112  5' (1.524 m)     Head Circumference --      Peak Flow --      Pain Score --      Pain Loc --      Pain Edu? --      Excl. in GC? --      Constitutional: Alert and oriented. Well appearing and in no acute distress. Eyes: Conjunctivae are normal.  Head: Atraumatic. ENT:      Ears:       Nose: No congestion/rhinnorhea.      Mouth/Throat: Mucous membranes are moist.  Neck: No stridor.  Chest: Breast exam: No masses felt in either breast. No discharge present. No swelling. No dimpling. No lymphadenopathy.  Mild tenderness to palpation on the lateral edge bilaterally. Cardiovascular: Normal  rate, regular rhythm. Normal S1 and S2.  Good peripheral circulation. Respiratory: Normal respiratory effort without tachypnea or retractions. Lungs CTAB. Good air entry to the bases with no decreased or absent breath sounds. Gastrointestinal: Bowel sounds 4 quadrants. Soft and nontender to palpation. No guarding or rigidity. No palpable masses. No distention. No CVA tenderness. Musculoskeletal: Full range of motion to all extremities. No gross deformities appreciated. Neurologic:  Normal speech and language. No gross focal neurologic deficits are appreciated.  Skin:  Skin is warm, dry and intact. No rash noted. Psychiatric: Mood and affect are normal. Speech and behavior are normal. Patient exhibits appropriate insight and judgement.   ____________________________________________   LABS (all labs ordered are listed, but only abnormal results are displayed)  Labs Reviewed  PREGNANCY, URINE  POCT PREGNANCY, URINE   ____________________________________________  EKG   ____________________________________________  RADIOLOGY   No results found.  ____________________________________________    PROCEDURES  Procedure(s) performed:    Procedures    Medications - No data to display   ____________________________________________   INITIAL IMPRESSION / ASSESSMENT AND PLAN / ED COURSE  Pertinent labs & imaging results that were available during my care of the patient were reviewed by me and considered in my medical decision making (see chart for details).  Review of the [place] CSRS was performed in accordance of the [facility] prior to dispensing any controlled drugs.  Clinical Course     Patient's diagnosis is consistent with galactorrhea. Pregnancy test is negative. Patient denies any urinary symptoms or vaginal discharge. Patient has not had any medication changes. Patient does not have any other symptoms. Patient was instructed to follow-up with OB/GYN for hormonal workup.  Patient is given ED precautions to return to the ED for any worsening or new symptoms.     ____________________________________________  FINAL CLINICAL IMPRESSION(S) / ED DIAGNOSES  Final diagnoses:  Galactorrhea      NEW MEDICATIONS STARTED DURING THIS VISIT:  New Prescriptions   No medications on file        This chart was dictated using voice recognition software/Dragon. Despite best efforts to proofread, errors can occur which can change the meaning. Any change was purely unintentional.   Enid DerryAshley Palmira Stickle, PA-C 05/11/16 2230    Sharman CheekPhillip Stafford, MD 05/14/16 2315

## 2016-05-11 NOTE — ED Notes (Signed)
Pt left prior to receiving D/C instructions. Pt was visualized by Lelon MastSamantha, EDT ambulating with no difficulty at this time.

## 2016-06-19 ENCOUNTER — Encounter: Payer: Self-pay | Admitting: Obstetrics and Gynecology

## 2016-06-19 ENCOUNTER — Ambulatory Visit (INDEPENDENT_AMBULATORY_CARE_PROVIDER_SITE_OTHER): Payer: BLUE CROSS/BLUE SHIELD | Admitting: Obstetrics and Gynecology

## 2016-06-19 VITALS — BP 135/86 | HR 80 | Ht 60.0 in | Wt 93.0 lb

## 2016-06-19 DIAGNOSIS — N949 Unspecified condition associated with female genital organs and menstrual cycle: Secondary | ICD-10-CM | POA: Insufficient documentation

## 2016-06-19 DIAGNOSIS — N926 Irregular menstruation, unspecified: Secondary | ICD-10-CM | POA: Diagnosis not present

## 2016-06-19 DIAGNOSIS — R102 Pelvic and perineal pain: Secondary | ICD-10-CM | POA: Insufficient documentation

## 2016-06-19 DIAGNOSIS — N643 Galactorrhea not associated with childbirth: Secondary | ICD-10-CM | POA: Insufficient documentation

## 2016-06-19 DIAGNOSIS — O926 Galactorrhea: Secondary | ICD-10-CM

## 2016-06-19 LAB — POCT URINE PREGNANCY: Preg Test, Ur: NEGATIVE

## 2016-06-19 MED ORDER — AZITHROMYCIN 1 G PO PACK: 1.0000 g | PACK | Freq: Once | ORAL | 0 refills | Status: AC

## 2016-06-19 NOTE — Progress Notes (Signed)
GYN ENCOUNTER NOTE  Subjective:       Kristy Hansen is a 31 y.o. [phone] female is here for gynecologic evaluation of the following issues:  1. Pelvic pain 2. Irregular menstrual cycle 3. Galactorrhea  Birth control: Nothing Pelvic pain ongoing for approximately 4 weeks. Most recent menses was heavy and painful; clots were present. Patient is experiencing nipple discharge from both breasts. No new sex partners. Patient denies fever, chills, sweats, change in appetite, UTI symptoms, or diarrhea. She is experiencing some nausea and headaches. Pregnancy test in the emergency room December was negative     Gynecologic History Patient's last menstrual period was 06/15/2016 (exact date). Contraception: none  Obstetric History OB History  Gravida Para Term Preterm AB Living  4 4 4     4   SAB TAB Ectopic Multiple Live Births          4    # Outcome Date GA Lbr Len/2nd Weight Sex Delivery Anes PTL Lv  4 Term 02/07/14    F Vag-Spont  N LIV  3 Term 03/24/10    F Vag-Spont  N LIV  2 Term 02/24/08    M Vag-Spont  N LIV  1 Term 01/22/07    F Vag-Spont   LIV      Past Medical History:  Diagnosis Date  . Anxiety   . Autoimmune disease (HCC)   . Chiari malformation type I (HCC)   . Hematuria, gross   . Pyelonephritis   . Renal cyst     Past Surgical History:  Procedure Laterality Date  . CYST REMOVAL PEDIATRIC     Spine  . MYRINGOTOMY WITH TUBE PLACEMENT      Current Outpatient Prescriptions on File Prior to Visit  Medication Sig Dispense Refill  . metaxalone (SKELAXIN) 800 MG tablet Take 800 mg by mouth 3 (three) times daily.     No current facility-administered medications on file prior to visit.     Allergies  Allergen Reactions  . Penicillins Shortness Of Breath and Swelling  . Aspirin Hives  . Naprosyn [Naproxen] Hives  . Tramadol Hives    Social History   Social History  . Marital status: Married    Spouse name: N/A  . Number of children: N/A  .  Years of education: N/A   Occupational History  . Not on file.   Social History Main Topics  . Smoking status: Current Some Day Smoker    Packs/day: 0.25    Types: Cigarettes  . Smokeless tobacco: Never Used  . Alcohol use 0.0 oz/week     Comment: occas  . Drug use: No  . Sexual activity: Yes    Birth control/ protection: None   Other Topics Concern  . Not on file   Social History Narrative  . No narrative on file    Family History  Problem Relation Age of Onset  . Cancer Mother     breast cancer, ovarian cancer  . Diabetes Maternal Grandmother   . Kidney disease Neg Hx   . Bladder Cancer Neg Hx   . Prostate cancer Neg Hx     The following portions of the patient's history were reviewed and updated as appropriate: allergies, current medications, past family history, past medical history, past social history, past surgical history and problem list.  Review of Systems Review of Systems - Comprehensive review of systems is positive for that noted in history of present illness  Objective:   BP 135/86   Pulse 80  Ht 5' (1.524 m)   Wt 93 lb (42.2 kg)   LMP 06/15/2016 (Exact Date)   BMI 18.16 kg/m  CONSTITUTIONAL: Well-developed, well-nourished female in no acute distress.  HENT:  Normocephalic, atraumatic.  NECK: Normal range of motion, supple, no masses.  Normal thyroid.  SKIN: Skin is warm and dry. No rash noted. Not diaphoretic. No erythema. No pallor. NEUROLGIC: Alert and oriented to person, place, and time. PSYCHIATRIC: Normal mood and affect. Normal behavior. Normal judgment and thought content. CARDIOVASCULAR:Not Examined RESPIRATORY: Not Examined BREASTS: Bilateral galactorrhea/nipple discharge; no dominant mass or adenopathy ABDOMEN: Soft, non distended; mild tenderness with guarding.  No Organomegaly. PELVIC:  External Genitalia: Normal  BUS: Normal  Vagina: Normal  Cervix: Normal; No significant discharge; cervical motion tenderness 2/4  Uterus:  Normal size, shape,consistency, mobile; tender 2/4  Adnexa: Normal; nonpalpable and nontender  RV: Normal external exam  Bladder: Nontender MUSCULOSKELETAL: Normal range of motion. No tenderness.  No cyanosis, clubbing, or edema.     Assessment:   1. Irregular menses - POCT urine pregnancy - TSH  2. Galactorrhea - Prolactin  3. Pelvic pain - US Transvaginal Non-OB; Future - US Pelvis Complete; Future - GC/Chlamydia Probe Amp  4. Uterine tenderness, concerning for endometritis     Plan:   1. TSH 2. Prolactin level 3. GC/CT NAAT 4. Zithromax 1 g by mouth 5. Pelvic ultrasound 6. Return in 2 weeks for follow-up  A total of 15 minutes were spent face-to-face with the patient during this encounter and over half of that time dealt with counseling and coordination of care.  Herold HarmsMartin A Alieyah Spader, MD  Note: This dictation was prepared with Dragon dictation along with smaller phrase technology. Any transcriptional errors that result from this process are unintentional.

## 2016-06-19 NOTE — Patient Instructions (Addendum)
1. TSH and prolactin level are obtained today 2. Testing for chlamydia and gonorrhea are obtained today 3. Ultrasound of pelvis is scheduled 4. Zithromax 1 g orally is prescribed for pelvic pain 5. Return in 2 weeks for follow-up

## 2016-06-20 LAB — TSH: TSH: 1.74 u[IU]/mL (ref 0.450–4.500)

## 2016-06-20 LAB — PROLACTIN: Prolactin: 10.8 ng/mL (ref 4.8–23.3)

## 2016-06-21 LAB — GC/CHLAMYDIA PROBE AMP
CHLAMYDIA, DNA PROBE: NEGATIVE
Neisseria gonorrhoeae by PCR: NEGATIVE

## 2016-06-24 NOTE — Progress Notes (Deleted)
ANNUAL PREVENTATIVE CARE GYN  ENCOUNTER NOTE  Subjective:       Kristy Hansen is a 31 y.o. [phone] female here for a routine annual gynecologic exam.  Current complaints: 1.     Gynecologic History Patient's last menstrual period was 06/15/2016 (exact date). Contraception: none Last Pap: 10/05/2014 neg. Results were: normal Last mammogram: n/a. Results were: Marland Kitchen.  Obstetric History OB History  Gravida Para Term Preterm AB Living  4 4 4     4   SAB TAB Ectopic Multiple Live Births          4    # Outcome Date GA Lbr Len/2nd Weight Sex Delivery Anes PTL Lv  4 Term 02/07/14    F Vag-Spont  N LIV  3 Term 03/24/10    F Vag-Spont  N LIV  2 Term 02/24/08    M Vag-Spont  N LIV  1 Term 01/22/07    F Vag-Spont   LIV      Past Medical History:  Diagnosis Date  . Anxiety   . Autoimmune disease (HCC)   . Chiari malformation type I (HCC)   . Hematuria, gross   . Pyelonephritis   . Renal cyst     Past Surgical History:  Procedure Laterality Date  . CYST REMOVAL PEDIATRIC     Spine  . MYRINGOTOMY WITH TUBE PLACEMENT      Current Outpatient Prescriptions on File Prior to Visit  Medication Sig Dispense Refill  . metaxalone (SKELAXIN) 800 MG tablet Take 800 mg by mouth 3 (three) times daily.     No current facility-administered medications on file prior to visit.     Allergies  Allergen Reactions  . Penicillins Shortness Of Breath and Swelling  . Aspirin Hives  . Naprosyn [Naproxen] Hives  . Tramadol Hives    Social History   Social History  . Marital status: Married    Spouse name: N/A  . Number of children: N/A  . Years of education: N/A   Occupational History  . Not on file.   Social History Main Topics  . Smoking status: Current Some Day Smoker    Packs/day: 0.25    Types: Cigarettes  . Smokeless tobacco: Never Used  . Alcohol use 0.0 oz/week     Comment: occas  . Drug use: No  . Sexual activity: Yes    Birth control/ protection: None   Other Topics  Concern  . Not on file   Social History Narrative  . No narrative on file    Family History  Problem Relation Age of Onset  . Cancer Mother     breast cancer, ovarian cancer  . Diabetes Maternal Grandmother   . Kidney disease Neg Hx   . Bladder Cancer Neg Hx   . Prostate cancer Neg Hx     The following portions of the patient's history were reviewed and updated as appropriate: allergies, current medications, past family history, past medical history, past social history, past surgical history and problem list.  Review of Systems ROS Review of Systems - General ROS: negative for - chills, fatigue, fever, hot flashes, night sweats, weight gain or weight loss Psychological ROS: negative for - anxiety, decreased libido, depression, mood swings, physical abuse or sexual abuse Ophthalmic ROS: negative for - blurry vision, eye pain or loss of vision ENT ROS: negative for - headaches, hearing change, visual changes or vocal changes Allergy and Immunology ROS: negative for - hives, itchy/watery eyes or seasonal allergies Hematological and Lymphatic  ROS: negative for - bleeding problems, bruising, swollen lymph nodes or weight loss Endocrine ROS: negative for - galactorrhea, hair pattern changes, hot flashes, malaise/lethargy, mood swings, palpitations, polydipsia/polyuria, skin changes, temperature intolerance or unexpected weight changes Breast ROS: negative for - new or changing breast lumps or nipple discharge Respiratory ROS: negative for - cough or shortness of breath Cardiovascular ROS: negative for - chest pain, irregular heartbeat, palpitations or shortness of breath Gastrointestinal ROS: no abdominal pain, change in bowel habits, or black or bloody stools Genito-Urinary ROS: no dysuria, trouble voiding, or hematuria Musculoskeletal ROS: negative for - joint pain or joint stiffness Neurological ROS: negative for - bowel and bladder control changes Dermatological ROS: negative for  rash and skin lesion changes   Objective:   LMP 06/15/2016 (Exact Date)  CONSTITUTIONAL: Well-developed, well-nourished female in no acute distress.  PSYCHIATRIC: Normal mood and affect. Normal behavior. Normal judgment and thought content. NEUROLGIC: Alert and oriented to person, place, and time. Normal muscle tone coordination. No cranial nerve deficit noted. HENT:  Normocephalic, atraumatic, External right and left ear normal. Oropharynx is clear and moist EYES: Conjunctivae and EOM are normal. Pupils are equal, round, and reactive to light. No scleral icterus.  NECK: Normal range of motion, supple, no masses.  Normal thyroid.  SKIN: Skin is warm and dry. No rash noted. Not diaphoretic. No erythema. No pallor. CARDIOVASCULAR: Normal heart rate noted, regular rhythm, no murmur. RESPIRATORY: Clear to auscultation bilaterally. Effort and breath sounds normal, no problems with respiration noted. BREASTS: Symmetric in size. No masses, skin changes, nipple drainage, or lymphadenopathy. ABDOMEN: Soft, normal bowel sounds, no distention noted.  No tenderness, rebound or guarding.  BLADDER: Normal PELVIC:  External Genitalia: Normal  BUS: Normal  Vagina: Normal  Cervix: Normal  Uterus: Normal  Adnexa: Normal  RV: {Blank multiple:19196::"External Exam NormaI","No Rectal Masses","Normal Sphincter tone"}  MUSCULOSKELETAL: Normal range of motion. No tenderness.  No cyanosis, clubbing, or edema.  2+ distal pulses. LYMPHATIC: No Axillary, Supraclavicular, or Inguinal Adenopathy.    Assessment:   Annual gynecologic examination 31 y.o. Contraception: none bmi- Problem List Items Addressed This Visit    None    Visit Diagnoses    Well woman exam with routine gynecological exam    -  Primary      Plan:  Pap: Pap, Reflex if ASCUS Mammogram: Not Indicated Stool Guaiac Testing:  Not Indicated Labs: ? Routine preventative health maintenance measures emphasized: {Blank  multiple:19196::"Exercise/Diet/Weight control","Tobacco Warnings","Alcohol/Substance use risks","Stress Management","Peer Pressure Issues","Safe Sex"} *** Return to Clinic - 1 [address] [place], [place]

## 2016-07-03 ENCOUNTER — Encounter: Payer: BLUE CROSS/BLUE SHIELD | Admitting: Obstetrics and Gynecology

## 2016-07-08 ENCOUNTER — Telehealth: Payer: Self-pay | Admitting: Obstetrics and Gynecology

## 2016-07-08 NOTE — Telephone Encounter (Signed)
Pt aware of lab results 

## 2016-07-08 NOTE — Telephone Encounter (Signed)
PT SAID PLEASE CALL HER BACK.

## 2016-07-11 ENCOUNTER — Telehealth: Payer: Self-pay | Admitting: Obstetrics and Gynecology

## 2016-07-11 NOTE — Telephone Encounter (Signed)
PT CALLED AND SHE WANTED A CALL BACK SHE SAID SHE WAS HAVING SOME PAIN

## 2016-07-11 NOTE — Telephone Encounter (Signed)
lmtrc

## 2016-07-11 NOTE — Telephone Encounter (Signed)
Pt states her urine is stil/ cloudy and she is having pain. She has increased her h20 and tried cranberry juice. Offered appt but pt can not get here by 5pm. Made nurse appt for Monday at 8:00. Advised if pt has severe pain or fever she will need to be seen in the ER or urgent care.  Advised azo otc.

## 2016-07-14 ENCOUNTER — Ambulatory Visit (INDEPENDENT_AMBULATORY_CARE_PROVIDER_SITE_OTHER): Payer: Self-pay | Admitting: Obstetrics and Gynecology

## 2016-07-14 DIAGNOSIS — R300 Dysuria: Secondary | ICD-10-CM

## 2016-07-14 DIAGNOSIS — R350 Frequency of micturition: Secondary | ICD-10-CM

## 2016-07-14 LAB — POCT URINALYSIS DIPSTICK
BILIRUBIN UA: NEGATIVE
GLUCOSE UA: NEGATIVE
KETONES UA: NEGATIVE
Nitrite, UA: NEGATIVE
SPEC GRAV UA: 1.025
Urobilinogen, UA: NEGATIVE
pH, UA: 6.5

## 2016-07-14 MED ORDER — NITROFURANTOIN MONOHYD MACRO 100 MG PO CAPS: 100.0000 mg | ORAL_CAPSULE | Freq: Two times a day (BID) | ORAL | 0 refills | Status: DC

## 2016-07-14 NOTE — Progress Notes (Signed)
Pt states she is having painful urination and frequency. She has tried to increase fluids and azo. Mild relief. U/a showed large blood (menses) and trace of leuks. Due to pt having sx for longer than 7 days will send in macrobid. Will contact pt with culture results. Pt advised to increase h20 and cranberry juice.  Herold HarmsMartin A Defrancesco, MD   I have reviewed the record and concur with patient management and plan. DEFRANCESCO, Daphine DeutscherMARTIN, MD, Evern CoreFACOG

## 2016-07-15 NOTE — Patient Instructions (Signed)
1. Urinalysis and urine culture obtained 2. Increase water and cranberry juice intake 3. Results from testing will be made available

## 2016-07-16 LAB — URINE CULTURE

## 2016-07-21 NOTE — Progress Notes (Signed)
ANNUAL PREVENTATIVE CARE GYN  ENCOUNTER NOTE  Subjective:       Kristy Hansen is a 31 y.o. [phone] female here for a routine annual gynecologic exam.  Current complaints: 1.  None  Last visit in January was notable for galactorrhea; TSH and prolactin levels are normal. Patient also had uterine tenderness on exam; GC/CT testing was negative. Patient was treated with Zithromax and tenderness has lessened. Menses are normal at this time.   Gynecologic History No LMP recorded. Contraception: none Last Pap: 10/05/2014 neg. Results were: normal Last mammogram: n/a. Results were: [facility] Kitchen  Obstetric History OB History  Gravida Para Term Preterm AB Living  4 4 4     4   SAB TAB Ectopic Multiple Live Births          4    # Outcome Date GA Lbr Len/2nd Weight Sex Delivery Anes PTL Lv  4 Term 02/07/14    F Vag-Spont  N LIV  3 Term 03/24/10    F Vag-Spont  N LIV  2 Term 02/24/08    M Vag-Spont  N LIV  1 Term 01/22/07    F Vag-Spont   LIV      Past Medical History:  Diagnosis Date  . Anxiety   . Autoimmune disease (HCC)   . Chiari malformation type I (HCC)   . Hematuria, gross   . Pyelonephritis   . Renal cyst     Past Surgical History:  Procedure Laterality Date  . CYST REMOVAL PEDIATRIC     Spine  . MYRINGOTOMY WITH TUBE PLACEMENT      Current Outpatient Prescriptions on File Prior to Visit  Medication Sig Dispense Refill  . metaxalone (SKELAXIN) 800 MG tablet Take 800 mg by mouth 3 (three) times daily.    . nitrofurantoin, macrocrystal-monohydrate, (MACROBID) 100 MG capsule Take 1 capsule (100 mg total) by mouth 2 (two) times daily. 14 capsule 0   No current facility-administered medications on file prior to visit.     Allergies  Allergen Reactions  . Penicillins Shortness Of Breath and Swelling  . Aspirin Hives  . Naprosyn [Naproxen] Hives  . Tramadol Hives    Social History   Social History  . Marital status: Married    Spouse name: N/A  . Number of children:  N/A  . Years of education: N/A   Occupational History  . Not on file.   Social History Main Topics  . Smoking status: Current Some Day Smoker    Packs/day: 0.25    Types: Cigarettes  . Smokeless tobacco: Never Used  . Alcohol use 0.0 oz/week     Comment: occas  . Drug use: No  . Sexual activity: Yes    Birth control/ protection: None   Other Topics Concern  . Not on file   Social History Narrative  . No narrative on file    Family History  Problem Relation Age of Onset  . Cancer Mother     breast cancer, ovarian cancer  . Diabetes Maternal Grandmother   . Kidney disease Neg Hx   . Bladder Cancer Neg Hx   . Prostate cancer Neg Hx     The following portions of the patient's history were reviewed and updated as appropriate: allergies, current medications, past family history, past medical history, past social history, past surgical history and problem list.  Review of Systems ROS Review of Systems - General ROS: negative for - chills, fatigue, fever, hot flashes, night sweats, weight gain or weight  loss Psychological ROS: negative for - anxiety, decreased libido, depression, mood swings, physical abuse or sexual abuse Ophthalmic ROS: negative for - blurry vision, eye pain or loss of vision ENT ROS: negative for - headaches, hearing change, visual changes or vocal changes Allergy and Immunology ROS: negative for - hives, itchy/watery eyes or seasonal allergies Hematological and Lymphatic ROS: negative for - bleeding problems, bruising, swollen lymph nodes or weight loss Endocrine ROS: negative for - galactorrhea, hair pattern changes, hot flashes, malaise/lethargy, mood swings, palpitations, polydipsia/polyuria, skin changes, temperature intolerance or unexpected weight changes Breast ROS: negative for - new or changing breast lumps or nipple discharge Respiratory ROS: negative for - cough or shortness of breath Cardiovascular ROS: negative for - chest pain, irregular  heartbeat, palpitations or shortness of breath Gastrointestinal ROS: no abdominal pain, change in bowel habits, or black or bloody stools Genito-Urinary ROS: no dysuria, trouble voiding, or hematuria Musculoskeletal ROS: negative for - joint pain or joint stiffness Neurological ROS: negative for - bowel and bladder control changes Dermatological ROS: negative for rash and skin lesion changes   Objective:   BP (!) 136/93   Pulse 77   Ht 5' (1.524 m)   Wt 92 lb 14.4 oz (42.1 kg)   LMP 07/19/2016 (Approximate)   BMI 18.14 kg/m  CONSTITUTIONAL: Well-developed, well-nourished female in no acute distress.  PSYCHIATRIC: Normal mood and affect. Normal behavior. Normal judgment and thought content. NEUROLGIC: Alert and oriented to person, place, and time. Normal muscle tone coordination. No cranial nerve deficit noted. HENT:  Normocephalic, atraumatic, External right and left ear normal. Oropharynx is clear and moist EYES: Conjunctivae and EOM are normal. Pupils are equal, round, and reactive to light. No scleral icterus.  NECK: Normal range of motion, supple, no masses.  Normal thyroid.  SKIN: Skin is warm and dry. No rash noted. Not diaphoretic. No erythema. No pallor. CARDIOVASCULAR: Normal heart rate noted, regular rhythm, no murmur. RESPIRATORY: Clear to auscultation bilaterally. Effort and breath sounds normal, no problems with respiration noted. BREASTS: Symmetric in size. No masses, skin changes; white nipple discharge right nipple; no ymphadenopathy. ABDOMEN: Soft, normal bowel sounds, no distention noted.  No tenderness, rebound or guarding.  BLADDER: Normal PELVIC:  External Genitalia: Normal  BUS: Normal  Vagina: Normal  Cervix: Normal; parous; slightly friable; mild cervical motion tenderness  Uterus: Small, anteverted, slightly tender 1/4  Adnexa: Normal; nonpalpable nontender  RV: External Exam NormaI  MUSCULOSKELETAL: Normal range of motion. No tenderness.  No cyanosis,  clubbing, or edema.  2+ distal pulses. LYMPHATIC: No Axillary, Supraclavicular, or Inguinal Adenopathy.    Assessment:   Annual gynecologic examination 31 y.o. Contraception:condom [id number] Problem List Items Addressed This Visit    Family history of ovarian cancer   Tobacco user   Irregular menses    Other Visit Diagnoses    Well woman exam with routine gynecological exam    -  Primary    Pelvic pain, lessened but still present following Zithromax therapy  Plan:  Pap: Pap, Reflex if ASCUS Mammogram: Not Indicated Stool Guaiac Testing:  Not Indicated Labs: vit d tsh lipid a1c fbs Routine preventative health maintenance measures emphasized: Exercise/Diet/Weight control, Tobacco Warnings, Alcohol/Substance use risks and Safe Sex Levaquin 500 mg a day for 7 days; Flagyl 100 mg twice a day for 7 days Return in 1 month for follow-up on pelvic pain Return to Clinic - 1 Year   Crystal White SandsMiller, CMA  Herold HarmsMartin A Adrionna Delcid, MD  Note: This dictation was prepared with  Dragon dictation along with smaller Lobbyist. Any transcriptional errors that result from this process are unintentional.

## 2016-07-23 ENCOUNTER — Ambulatory Visit (INDEPENDENT_AMBULATORY_CARE_PROVIDER_SITE_OTHER): Payer: BLUE CROSS/BLUE SHIELD | Admitting: Obstetrics and Gynecology

## 2016-07-23 ENCOUNTER — Encounter: Payer: Self-pay | Admitting: Obstetrics and Gynecology

## 2016-07-23 VITALS — BP 136/93 | HR 77 | Ht 60.0 in | Wt 92.9 lb

## 2016-07-23 DIAGNOSIS — Z01419 Encounter for gynecological examination (general) (routine) without abnormal findings: Secondary | ICD-10-CM

## 2016-07-23 DIAGNOSIS — Z68.41 Body mass index (BMI) pediatric, less than 5th percentile for age: Secondary | ICD-10-CM

## 2016-07-23 DIAGNOSIS — R102 Pelvic and perineal pain: Secondary | ICD-10-CM

## 2016-07-23 DIAGNOSIS — Z720 Tobacco use: Secondary | ICD-10-CM

## 2016-07-23 DIAGNOSIS — N926 Irregular menstruation, unspecified: Secondary | ICD-10-CM

## 2016-07-23 DIAGNOSIS — Z8041 Family history of malignant neoplasm of ovary: Secondary | ICD-10-CM

## 2016-07-23 MED ORDER — LEVOFLOXACIN 500 MG PO TABS: 500.0000 mg | ORAL_TABLET | Freq: Every day | ORAL | 0 refills | Status: DC

## 2016-07-23 MED ORDER — METRONIDAZOLE 500 MG PO TABS: 500.0000 mg | ORAL_TABLET | Freq: Two times a day (BID) | ORAL | 0 refills | Status: DC

## 2016-07-23 NOTE — Patient Instructions (Addendum)
1. Pap smear is done 2. Self breast awareness is encouraged 3. Screening labs are ordered 4. Contraception-condoms 5. Levaquin 500 mg a day for 7 days; Flagyl 500 mg twice a day for 7 days 6. Continue with healthy eating and exercise 7. Smoking cessation strongly encouraged 8. Return in 1 year 9. Return in 1 month for follow-up on pelvic pain  Health Maintenance, Female Introduction Adopting a healthy lifestyle and getting preventive care can go a long way to promote health and wellness. Talk with your health care provider about what schedule of regular examinations is right for you. This is a good chance for you to check in with your provider about disease prevention and staying healthy. In between checkups, there are plenty of things you can do on your own. Experts have done a lot of research about which lifestyle changes and preventive measures are most likely to keep you healthy. Ask your health care provider for more information. Weight and diet Eat a healthy diet  Be sure to include plenty of vegetables, fruits, low-fat dairy products, and lean protein.  Do not eat a lot of foods high in solid fats, added sugars, or salt.  Get regular exercise. This is one of the most important things you can do for your health.  Most adults should exercise for at least 150 minutes each week. The exercise should increase your heart rate and make you sweat (moderate-intensity exercise).  Most adults should also do strengthening exercises at least twice a week. This is in addition to the moderate-intensity exercise. Maintain a healthy weight  Body mass index (BMI) is a measurement that can be used to identify possible weight problems. It estimates body fat based on height and weight. Your health care provider can help determine your BMI and help you achieve or maintain a healthy weight.  For females 64 years of age and older:  A BMI below 18.5 is considered underweight.  A BMI of 18.5 to 24.9 is  normal.  A BMI of 25 to 29.9 is considered overweight.  A BMI of 30 and above is considered obese. Watch levels of cholesterol and blood lipids  You should start having your blood tested for lipids and cholesterol at 29 years of age, then have this test every 5 years.  You may need to have your cholesterol levels checked more often if:  Your lipid or cholesterol levels are high.  You are older than 29 years of age.  You are at high risk for heart disease. Cancer screening Lung Cancer  Lung cancer screening is recommended for adults 19-20 years old who are at high risk for lung cancer because of a history of smoking.  A yearly low-dose CT scan of the lungs is recommended for people who:  Currently smoke.  Have quit within the past 15 years.  Have at least a 30-pack-year history of smoking. A pack year is smoking an average of one pack of cigarettes a day for 1 year.  Yearly screening should continue until it has been 15 years since you quit.  Yearly screening should stop if you develop a health problem that would prevent you from having lung cancer treatment. Breast Cancer  Practice breast self-awareness. This means understanding how your breasts normally appear and feel.  It also means doing regular breast self-exams. Let your health care provider know about any changes, no matter how small.  If you are in your 20s or 30s, you should have a clinical breast exam (CBE) by  a health care provider every 1-3 years as part of a regular health exam.  If you are 8 or older, have a CBE every year. Also consider having a breast X-ray (mammogram) every year.  If you have a family history of breast cancer, talk to your health care provider about genetic screening.  If you are at high risk for breast cancer, talk to your health care provider about having an MRI and a mammogram every year.  Breast cancer gene (BRCA) assessment is recommended for women who have family members with  BRCA-related cancers. BRCA-related cancers include:  Breast.  Ovarian.  Tubal.  Peritoneal cancers.  Results of the assessment will determine the need for genetic counseling and BRCA1 and BRCA2 testing. Cervical Cancer  Your health care provider may recommend that you be screened regularly for cancer of the pelvic organs (ovaries, uterus, and vagina). This screening involves a pelvic examination, including checking for microscopic changes to the surface of your cervix (Pap test). You may be encouraged to have this screening done every 3 years, beginning at age 73.  For women ages 33-65, health care providers may recommend pelvic exams and Pap testing every 3 years, or they may recommend the Pap and pelvic exam, combined with testing for human papilloma virus (HPV), every 5 years. Some types of HPV increase your risk of cervical cancer. Testing for HPV may also be done on women of any age with unclear Pap test results.  Other health care providers may not recommend any screening for nonpregnant women who are considered low risk for pelvic cancer and who do not have symptoms. Ask your health care provider if a screening pelvic exam is right for you.  If you have had past treatment for cervical cancer or a condition that could lead to cancer, you need Pap tests and screening for cancer for at least 20 years after your treatment. If Pap tests have been discontinued, your risk factors (such as having a new sexual partner) need to be reassessed to determine if screening should resume. Some women have medical problems that increase the chance of getting cervical cancer. In these cases, your health care provider may recommend more frequent screening and Pap tests. Colorectal Cancer  This type of cancer can be detected and often prevented.  Routine colorectal cancer screening usually begins at 29 years of age and continues through 29 years of age.  Your health care provider may recommend screening at  an earlier age if you have risk factors for colon cancer.  Your health care provider may also recommend using home test kits to check for hidden blood in the stool.  A small camera at the end of a tube can be used to examine your colon directly (sigmoidoscopy or colonoscopy). This is done to check for the earliest forms of colorectal cancer.  Routine screening usually begins at age 80.  Direct examination of the colon should be repeated every 5-10 years through 29 years of age. However, you may need to be screened more often if early forms of precancerous polyps or small growths are found. Skin Cancer  Check your skin from head to toe regularly.  Tell your health care provider about any new moles or changes in moles, especially if there is a change in a mole's shape or color.  Also tell your health care provider if you have a mole that is larger than the size of a pencil eraser.  Always use sunscreen. Apply sunscreen liberally and repeatedly throughout the  day.  Protect yourself by wearing long sleeves, pants, a wide-brimmed hat, and sunglasses whenever you are outside. Heart disease, diabetes, and high blood pressure  High blood pressure causes heart disease and increases the risk of stroke. High blood pressure is more likely to develop in:  People who have blood pressure in the high end of the normal range (130-139/85-89 mm Hg).  People who are overweight or obese.  People who are African American.  If you are 69-40 years of age, have your blood pressure checked every 3-5 years. If you are 77 years of age or older, have your blood pressure checked every year. You should have your blood pressure measured twice-once when you are at a hospital or clinic, and once when you are not at a hospital or clinic. Record the average of the two measurements. To check your blood pressure when you are not at a hospital or clinic, you can use:  An automated blood pressure machine at a pharmacy.  A  home blood pressure monitor.  If you are between 69 years and 74 years old, ask your health care provider if you should take aspirin to prevent strokes.  Have regular diabetes screenings. This involves taking a blood sample to check your fasting blood sugar level.  If you are at a normal weight and have a low risk for diabetes, have this test once every three years after 29 years of age.  If you are overweight and have a high risk for diabetes, consider being tested at a younger age or more often. Preventing infection Hepatitis B  If you have a higher risk for hepatitis B, you should be screened for this virus. You are considered at high risk for hepatitis B if:  You were born in a country where hepatitis B is common. Ask your health care provider which countries are considered high risk.  Your parents were born in a high-risk country, and you have not been immunized against hepatitis B (hepatitis B vaccine).  You have HIV or AIDS.  You use needles to inject street drugs.  You live with someone who has hepatitis B.  You have had sex with someone who has hepatitis B.  You get hemodialysis treatment.  You take certain medicines for conditions, including cancer, organ transplantation, and autoimmune conditions. Hepatitis C  Blood testing is recommended for:  Everyone born from [birth year] through 1963.  Anyone with known risk factors for hepatitis C. Sexually transmitted infections (STIs)  You should be screened for sexually transmitted infections (STIs) including gonorrhea and chlamydia if:  You are sexually active and are younger than 29 years of age.  You are older than 29 years of age and your health care provider tells you that you are at risk for this type of infection.  Your sexual activity has changed since you were last screened and you are at an increased risk for chlamydia or gonorrhea. Ask your health care provider if you are at risk.  If you do not have HIV, but are  at risk, it may be recommended that you take a prescription medicine daily to prevent HIV infection. This is called pre-exposure prophylaxis (PrEP). You are considered at risk if:  You are sexually active and do not regularly use condoms or know the HIV status of your partner(s).  You take drugs by injection.  You are sexually active with a partner who has HIV. Talk with your health care provider about whether you are at high risk of being infected  with HIV. If you choose to begin PrEP, you should first be tested for HIV. You should then be tested every 3 months for as long as you are taking PrEP. Pregnancy  If you are premenopausal and you may become pregnant, ask your health care provider about preconception counseling.  If you may become pregnant, take 400 to 800 micrograms (mcg) of folic acid every day.  If you want to prevent pregnancy, talk to your health care provider about birth control (contraception). Osteoporosis and menopause  Osteoporosis is a disease in which the bones lose minerals and strength with aging. This can result in serious bone fractures. Your risk for osteoporosis can be identified using a bone density scan.  If you are 54 years of age or older, or if you are at risk for osteoporosis and fractures, ask your health care provider if you should be screened.  Ask your health care provider whether you should take a calcium or vitamin D supplement to lower your risk for osteoporosis.  Menopause may have certain physical symptoms and risks.  Hormone replacement therapy may reduce some of these symptoms and risks. Talk to your health care provider about whether hormone replacement therapy is right for you. Follow these instructions at home:  Schedule regular health, dental, and eye exams.  Stay current with your immunizations.  Do not use any tobacco products including cigarettes, chewing tobacco, or electronic cigarettes.  If you are pregnant, do not drink  alcohol.  If you are breastfeeding, limit how much and how often you drink alcohol.  Limit alcohol intake to no more than 1 drink per day for nonpregnant women. One drink equals 12 ounces of beer, 5 ounces of wine, or 1 ounces of hard liquor.  Do not use street drugs.  Do not share needles.  Ask your health care provider for help if you need support or information about quitting drugs.  Tell your health care provider if you often feel depressed.  Tell your health care provider if you have ever been abused or do not feel safe at home. This information is not intended to replace advice given to you by your health care provider. Make sure you discuss any questions you have with your health care provider. Document Released: 12/09/2010 Document Revised: 11/01/2015 Document Reviewed: 02/27/2015  2017 [employer]

## 2016-07-25 LAB — PAP IG W/ RFLX HPV ASCU: PAP Smear Comment: 0

## 2016-07-29 ENCOUNTER — Other Ambulatory Visit: Payer: Self-pay

## 2016-07-30 LAB — LIPID PANEL
CHOLESTEROL TOTAL: 135 mg/dL (ref 100–199)
Chol/HDL Ratio: 2.6 (ref 0.0–4.4)
HDL: 52 mg/dL (ref >39)
LDL CALC: 72 (ref 0–99)
TRIGLYCERIDES: 54 mg/dL (ref 0–149)
VLDL Cholesterol Cal: 11 (ref 5–40)

## 2016-07-30 LAB — GLUCOSE, RANDOM: Glucose: 78 mg/dL (ref 65–99)

## 2016-07-30 LAB — HEMOGLOBIN A1C
Est. average glucose Bld gHb Est-mCnc: 97
Hgb A1c MFr Bld: 5 % (ref 4.8–5.6)

## 2016-07-30 LAB — VITAMIN D 25 HYDROXY (VIT D DEFICIENCY, FRACTURES): Vit D, 25-Hydroxy: 34.2 ng/mL (ref 30.0–100.0)

## 2016-07-30 LAB — TSH: TSH: 1.5 u[IU]/mL (ref 0.450–4.500)

## 2016-08-17 ENCOUNTER — Emergency Department: Payer: Medicaid Other

## 2016-08-17 ENCOUNTER — Encounter: Payer: Self-pay | Admitting: Emergency Medicine

## 2016-08-17 ENCOUNTER — Emergency Department
Admission: EM | Admit: 2016-08-17 | Discharge: 2016-08-17 | Disposition: A | Discharge status: Home / Self Care | Payer: Medicaid Other | Attending: Emergency Medicine | Admitting: Emergency Medicine

## 2016-08-17 DIAGNOSIS — F410 Panic disorder [episodic paroxysmal anxiety] without agoraphobia: Secondary | ICD-10-CM | POA: Insufficient documentation

## 2016-08-17 DIAGNOSIS — F430 Acute stress reaction: Secondary | ICD-10-CM

## 2016-08-17 DIAGNOSIS — F17210 Nicotine dependence, cigarettes, uncomplicated: Secondary | ICD-10-CM | POA: Insufficient documentation

## 2016-08-17 LAB — CBC WITH DIFFERENTIAL/PLATELET
Basophils Absolute: 0.1 10*3/uL (ref 0–0.1)
Basophils Relative: 1 %
Eosinophils Absolute: 0.1 10*3/uL (ref 0–0.7)
Eosinophils Relative: 1 %
HEMATOCRIT: 43.5 % (ref 35.0–47.0)
HEMOGLOBIN: 15 g/dL (ref 12.0–16.0)
LYMPHS ABS: 1.4 10*3/uL (ref 1.0–3.6)
LYMPHS PCT: 15 %
MCH: 30.9 pg (ref 26.0–34.0)
MCHC: 34.5 g/dL (ref 32.0–36.0)
MCV: 89.8 fL (ref 80.0–100.0)
MONOS PCT: 6 %
Monocytes Absolute: 0.6 10*3/uL (ref 0.2–0.9)
NEUTROS ABS: 7.4 10*3/uL — AB (ref 1.4–6.5)
NEUTROS PCT: 77 %
Platelets: 268 10*3/uL (ref 150–440)
RBC: 4.84 MIL/uL (ref 3.80–5.20)
RDW: 13 % (ref 11.5–14.5)
WBC: 9.5 10*3/uL (ref 3.6–11.0)

## 2016-08-17 LAB — BASIC METABOLIC PANEL
Anion gap: 6 (ref 5–15)
BUN: 14 mg/dL (ref 6–20)
CHLORIDE: 104 mmol/L (ref 101–111)
CO2: 29 mmol/L (ref 22–32)
CREATININE: 0.68 mg/dL (ref 0.44–1.00)
Calcium: 9.1 mg/dL (ref 8.9–10.3)
GFR calc non Af Amer: >60 mL/min (ref >60)
GLUCOSE: 115 mg/dL — AB (ref 65–99)
Potassium: 4.6 mmol/L (ref 3.5–5.1)
Sodium: 139 mmol/L (ref 135–145)

## 2016-08-17 MED ORDER — LORAZEPAM 1 MG PO TABS
1.0000 mg | ORAL_TABLET | Freq: Once | ORAL | Status: AC
  Administered 2016-08-17: 1 mg via ORAL
  Filled 2016-08-17: qty 1

## 2016-08-17 MED ORDER — LORAZEPAM 1 MG PO TABS: 1.0000 mg | ORAL_TABLET | Freq: Three times a day (TID) | ORAL | 0 refills | Status: AC | PRN

## 2016-08-17 NOTE — Discharge Instructions (Signed)
Please return immediately if condition worsens. Please contact her primary physician or the physician you were given for referral. If you have any specialist physicians involved in her treatment and plan please also contact them. Thank you for using [facility]. ° °

## 2016-08-17 NOTE — ED Notes (Signed)
Pt sitting up in bed on the phone, no distress noted, pt smiling and laughing, cont to monitor

## 2016-08-17 NOTE — ED Provider Notes (Signed)
Time Seen: Approximately 1801  I have reviewed the triage notes  Chief Complaint: Headache and Tingling   History of Present Illness: Kristy Hansen is a 31 y.o. female *who states that she felt like she was having a panic attack today. She presents with numbness and tingling on her face and her extremities. She states that she's had a history of panic attacks but states that she used "" doing well with her anxiety. She has a known history of Chiari I malformation. She has not had any surgery or any neurologic deficits and is [facility]. Her main concerns was the tingling in her face. She denies any chest pain or heart palpitations at this time.   Past Medical History:  Diagnosis Date  . Anxiety   . Autoimmune disease (HCC)   . Chiari malformation type I (HCC)   . Hematuria, gross   . Pyelonephritis   . Renal cyst     Patient Active Problem List   Diagnosis Date Noted  . Uterine tenderness 06/19/2016  . Pelvic pain 06/19/2016  . Galactorrhea 06/19/2016  . Irregular menses 06/19/2016  . Renal cyst 01/05/2015  . Paresthesias 12/12/2014  . Family history of ovarian cancer 12/12/2014  . Tobacco user 12/12/2014  . Renal mass 12/05/2014  . Anxiety 10/11/2014  . Back pain, chronic 07/17/2014    Past Surgical History:  Procedure Laterality Date  . CYST REMOVAL PEDIATRIC     Spine  . MYRINGOTOMY WITH TUBE PLACEMENT      Past Surgical History:  Procedure Laterality Date  . CYST REMOVAL PEDIATRIC     Spine  . MYRINGOTOMY WITH TUBE PLACEMENT      Current Outpatient Rx  . Order #: [id number] Class: Normal  . Order #: [id number] Class: Print  . Order #: [id number] Class: Historical Med  . Order #: [id number] Class: Normal    Allergies:  Penicillins; Aspirin; Naprosyn [naproxen]; and Tramadol  Family History: Family History  Problem Relation Age of Onset  . Cancer Mother     breast cancer, ovarian cancer  . Diabetes Maternal  Grandmother   . Kidney disease Neg Hx   . Bladder Cancer Neg Hx   . Prostate cancer Neg Hx     Social History: Social History  Substance Use Topics  . Smoking status: Current Some Day Smoker    Packs/day: 0.25    Types: Cigarettes  . Smokeless tobacco: Never Used  . Alcohol use 0.0 oz/week     Comment: occas     Review of Systems:   10 point review of systems was performed and was otherwise negative:  Constitutional: No fever Eyes: No visual disturbances ENT: No sore throat, ear pain Cardiac: No chest pain Respiratory: No shortness of breath, wheezing, or stridor Abdomen: No abdominal pain, no vomiting, No diarrhea Endocrine: No weight loss, No night sweats Extremities: No peripheral edema, cyanosis Skin: No rashes, easy bruising Neurologic: No focal weakness, trouble with speech or swollowing Urologic: No dysuria, Hematuria, or urinary frequency   Physical Exam:  ED Triage Vitals [08/17/16 1721]  Enc Vitals Group     BP (!) 140/98     Pulse Rate (!) 101     Resp 18     Temp 98.3 F (36.8 C)     Temp Source Oral     SpO2 100 %     Weight 92 lb (41.7 kg)     Height 5\' 1"  (1.549 m)     Head Circumference  Peak Flow      Pain Score 3     Pain Loc      Pain Edu?      Excl. in GC?     General: Awake , Alert , and Oriented times 3; GCS 15 Anxious Head: Normal cephalic , atraumatic Eyes: Pupils equal , round, reactive to light Nose/Throat: No nasal drainage, patent upper airway without erythema or exudate.  Neck: Supple, Full range of motion, No anterior adenopathy or palpable thyroid masses. No meningeal signs Lungs: Clear to ascultation without wheezes , rhonchi, or rales Heart: Regular rate, regular rhythm without murmurs , gallops , or rubs Abdomen: Soft, non tender without rebound, guarding , or rigidity; bowel sounds positive and symmetric in all 4 quadrants. No organomegaly .        Extremities: 2 plus symmetric pulses. No edema, clubbing or  cyanosis Neurologic: normal ambulation, Motor symmetric without deficits, sensory intact Skin: warm, dry, no rashes   Labs:   All laboratory work was reviewed including any pertinent negatives or positives listed below:  Labs Reviewed  CBC WITH DIFFERENTIAL/PLATELET - Abnormal; Notable for the following:       Result Value   Neutro Abs 7.4 (*)    All other components within normal limits  BASIC METABOLIC PANEL - Abnormal; Notable for the following:    Glucose, Bld 115 (*)    All other components within normal limits    EKG: * ED ECG REPORT I, Jennye Moccasin, the attending physician, personally viewed and interpreted this ECG.  Date: 08/17/2016 EKG Time: 1800 Rate: *86 Rhythm: normal sinus rhythm QRS Axis: normal Intervals: normal ST/T Wave abnormalities: normal Conduction Disturbances: none Narrative Interpretation: unremarkable Normal EKG  Radiology:  "Ct Head Wo Contrast  Result Date: 08/17/2016 CLINICAL DATA:  31 year old female with headache and left facial and bilateral leg tingling. History of Chiari 1 malformation. EXAM: CT HEAD WITHOUT CONTRAST TECHNIQUE: Contiguous axial images were obtained from the base of the skull through the vertex without intravenous contrast. COMPARISON:  01/09/2015 MR FINDINGS: Brain: No evidence of acute infarction, hemorrhage, hydrocephalus, extra-axial collection or mass lesion/mass effect. Chiari 1 malformation again noted. Vascular: No hyperdense vessel or unexpected calcification. Skull: Normal. Negative for fracture or focal lesion. Sinuses/Orbits: No acute finding. Other: None. IMPRESSION: No evidence of acute intracranial abnormality. Chiari 1 malformation again noted. Electronically Signed   By: Harmon Pier M.D.   On: 08/17/2016 18:08  " I personally reviewed the radiologic studies   ED Course:  Patient received Ativan 1 mg by mouth 1 with symptomatic relief. We went on discussed with the patient's been under a lot of stress  recently in her personal life and she thought she was being able to manage it. Now the patient states she feels much better after the Ativan will be prescribed more for home and is advisedwith her primary doctor along with her neurologist. I felt this was unlikely to be multiple sclerosis with the diffuse neurologic symptoms though I cannot disprove that she has no focal neurologic deficits on today's presentation     Assessment:  Panic attack and stress reaction   Final Clinical Impression:   Final diagnoses:  Panic attack as reaction to stress     Plan: * Outpatient " New Prescriptions   LORAZEPAM (ATIVAN) 1 MG TABLET    Take 1 tablet (1 mg total) by mouth every 8 (eight) hours as needed for anxiety.  " Patient was advised to return immediately if condition worsens.  Patient was advised to follow up with their primary care physician or other specialized physicians involved in their outpatient care. The patient and/or family member/power of attorney had laboratory results reviewed at the bedside. All questions and concerns were addressed and appropriate discharge instructions were distributed by the nursing staff.             Jennye Moccasin, MD 08/17/16 [id number]

## 2016-08-17 NOTE — ED Notes (Signed)
Pt reports that her nanny is coming to get her and her poppi will drive her car. Pt asking to walk to her car to smoke a cigarette

## 2016-08-17 NOTE — ED Triage Notes (Signed)
Pt presents to ED with c/o HA and tingling to L side of her face and to both legs. Pt states hx of chiari malformation at this time. Pt is neurologically intact, grip strengths equal bilaterally, facial symmetry intact. Pt denies light sensitivity/sound sensitivity.

## 2016-08-17 NOTE — ED Notes (Signed)
This RN back to triage room, pt states "I feel dizzy, I want to lay down". This RN was speaking with patient when patient became mildly responsive, was somewhat able to follow commands, and had blank stare. Pt then grabbed this RN's jacket and was able to speak in broken sentences, pt able to say, "don't let anything happen to me". Pt taken to room 2, Sunny SchleinFelicia, RN and Dr. Huel CoteQuigley made aware of patient's episode in triage. Pt noted to be more alert upon arrival to the room, states, "I'm sorry I just blanked out and I think I just had a panic attack."

## 2016-09-24 ENCOUNTER — Telehealth: Payer: Self-pay | Admitting: Obstetrics and Gynecology

## 2016-09-24 MED ORDER — FLUCONAZOLE 150 MG PO TABS: 150.0000 mg | ORAL_TABLET | Freq: Every day | ORAL | 0 refills | Status: DC

## 2016-09-24 NOTE — Telephone Encounter (Signed)
Patient has yeast infection and tried OTC 3 day with suppository - not any better she is miserable could you please call in something and reccommed something for relief like are the wiped helpful?? [employer]

## 2016-09-24 NOTE — Telephone Encounter (Signed)
Pt states she has a thick white d/c, itching, and burning. Tried monistat 3 day with no releif. Will erx diflucan. Pt aware if no better in  7 days she will need to be seen.

## 2016-09-25 ENCOUNTER — Ambulatory Visit (INDEPENDENT_AMBULATORY_CARE_PROVIDER_SITE_OTHER): Payer: Self-pay | Admitting: Obstetrics and Gynecology

## 2016-09-25 ENCOUNTER — Encounter: Payer: Self-pay | Admitting: Obstetrics and Gynecology

## 2016-09-25 VITALS — BP 132/94 | HR 103 | Ht 61.0 in | Wt 91.9 lb

## 2016-09-25 DIAGNOSIS — N898 Other specified noninflammatory disorders of vagina: Secondary | ICD-10-CM

## 2016-09-25 DIAGNOSIS — Z202 Contact with and (suspected) exposure to infections with a predominantly sexual mode of transmission: Secondary | ICD-10-CM

## 2016-09-25 DIAGNOSIS — L739 Follicular disorder, unspecified: Secondary | ICD-10-CM

## 2016-09-25 NOTE — Patient Instructions (Signed)
1. Wet prep done today is inconclusive 2. Nu swab was done for STD testing 3. HSV 1/HSV-2 antibody testing is drawn today 4. Follow-up as needed 5. The folliculitis noted on right thigh can be treated with sitz baths, warm compresses, Neosporin ointment

## 2016-09-25 NOTE — Progress Notes (Signed)
Chief complaint: 1. Vulvar lesion 2. Vaginal discharge  Patient presents today for STD evaluation. She noted a bump on her right labia majora which was minimally tender. No ulceration was noted. No fever, chills, sweats, or groin discomfort was. No new sexual contacts. Patient states that she always uses condoms. Instrument patient was given Diflucan for possible yeast infection.  Past medical history, past surgical history, problem list, medications, and allergies are reviewed  OBJECTIVE:BP (!) 132/94   Pulse (!) 103   Ht  (1.549 m)   Wt 91 lb 14.4 oz (41.7 kg)   LMP 09/07/2016 (Exact Date)   BMI 17.36 kg/m  Pleasant female, slightly anxious, no acute distress Abdomen: 0.5 x 1 cm erythematous rash sternal region, unclear etiology Pelvic exam: External genitalia-5 mm slightly tender follicular nodule noted in the right labia majora; no ulceration or epithelial skin breakdown BUS-normal Vagina-thick white discharge present Cervix-no cervical motion tenderness; no discharge Uterus-anteverted, mobile, nontender Adnexa-NONTENDER RECTOVAGINAL -NORMAL EXTERNAL EXAM  PROCEDURE: Wet prep KOH-negative for hyphae Normal saline-few white blood cells; no clue cells; no Trichomonas  ASSESSMENT: 1. Folliculitis of right labia majora 2. Probable monilia vaginitis 3. STD exposure, possible  PLAN: 1. Nu swab plus 2. Wet prep is noted 3. HSV 1/HSV-2 IgG and IgM antibodies ordered 4. Sitz baths and or warm compresses to right labia majora folliculitis 5. Return as needed  A total of 15 minutes were spent face-to-face with the patient during this encounter and over half of that time dealt with counseling and coordination of care.   Herold Harms, MD Note: This dictation was prepared with Dragon dictation along with smaller phrase technology. Any transcriptional errors that result from this process are unintentional.

## 2016-09-27 LAB — HSV(HERPES SIMPLEX VRS) I + II AB-IGG

## 2016-09-27 LAB — HSV(HERPES SIMPLEX VRS) I + II AB-IGM: HSVI/II COMB AB IGM: 1.2 ratio — AB (ref 0.00–0.90)

## 2016-09-29 ENCOUNTER — Other Ambulatory Visit: Payer: Self-pay

## 2016-09-29 DIAGNOSIS — Z202 Contact with and (suspected) exposure to infections with a predominantly sexual mode of transmission: Secondary | ICD-10-CM

## 2016-09-29 LAB — NUSWAB VAGINITIS PLUS (VG+)
CHLAMYDIA TRACHOMATIS, NAA: NEGATIVE
Candida albicans, NAA: POSITIVE — AB
Candida glabrata, NAA: NEGATIVE
NEISSERIA GONORRHOEAE, NAA: NEGATIVE
Trich vag by NAA: NEGATIVE

## 2016-09-29 MED ORDER — ACYCLOVIR 800 MG PO TABS: 800.0000 mg | ORAL_TABLET | Freq: Two times a day (BID) | ORAL | 1 refills | Status: DC

## 2016-10-27 ENCOUNTER — Other Ambulatory Visit: Payer: Medicaid Other

## 2016-11-03 IMAGING — MR MR ABDOMEN WO/W CM
15 of 16 series · 43 of 48 positions shown · IV contrast (8ML MULTIHANCE)
Comparison: 09/25/2013

CLINICAL DATA: Evaluate mass on left kidney.

EXAM:
MRI ABDOMEN WITHOUT AND WITH CONTRAST
TECHNIQUE: Multiplanar multisequence MR imaging of the abdomen was performed
both before and after the administration of intravenous contrast.
CONTRAST:  8mL MULTIHANCE GADOBENATE DIMEGLUMINE 529 MG/ML IV SOLN

[Series 3: cor ssfse / · coronal · 7.0mm · 1.48mm/px · 1 of 30 slices shown]
[im 1/30]
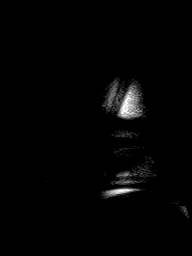

[Series 4: T2 · axial · 6.0mm · 1.48mm/px · 1 of 32 slices shown]
[im 1/32]
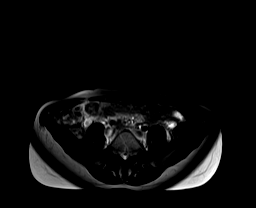

[Series 6: bSSFP · axial · 6.0mm · 0.74mm/px · 1 of 32 slices shown]
[im 1/32]
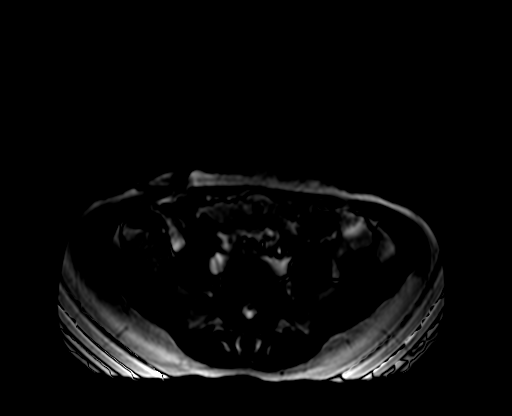

[Series 10: axial dynamic pre · axial · non-contrast · 4.0mm · 1.19mm/px · z∈[-83,+169]mm · 3 of 64 slices shown]
[im 1/64]
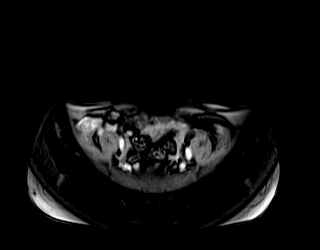
[im 32/64]
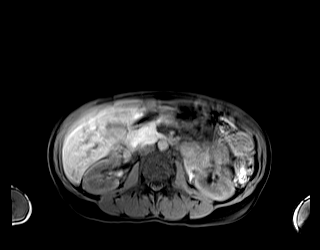
[im 64/64]
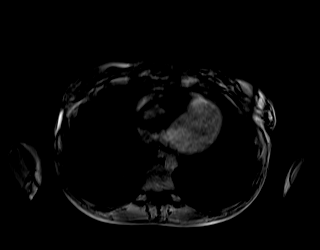

[Series 11: axial dynamic post · axial · 4.0mm · 1.19mm/px · z∈[-83,+169]mm · 3 of 64 slices shown (1 of 3)]
[im 1/64]
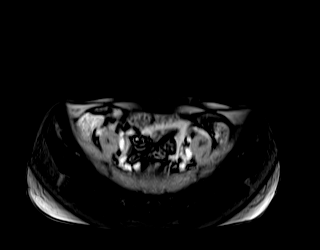
[im 32/64]
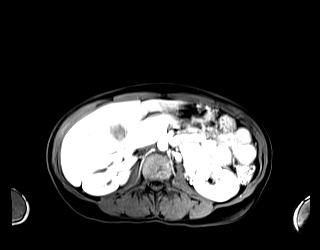
[im 64/64]
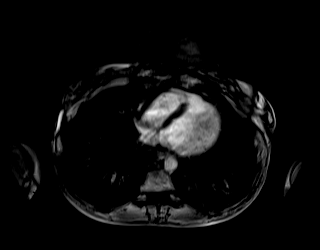

[Series 12: axial dynamic post · axial · 4.0mm · 1.19mm/px · z∈[-83,+169]mm · 4 of 64 slices shown (2 of 3)]
[im 1/64]
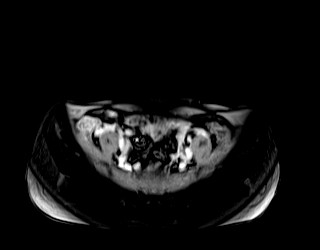
[im 22/64]
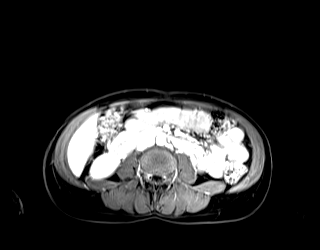
[im 43/64]
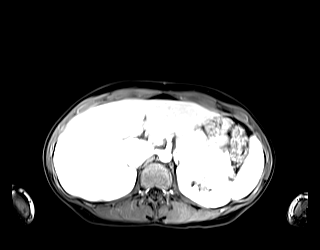
[im 64/64]
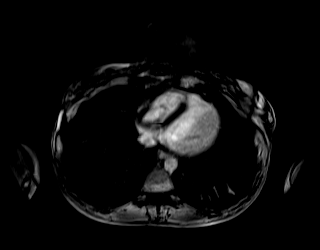

[Series 13: axial dynamic post · axial · 4.0mm · 1.19mm/px · z∈[-83,+169]mm · 4 of 64 slices shown (3 of 3)]
[im 1/64]
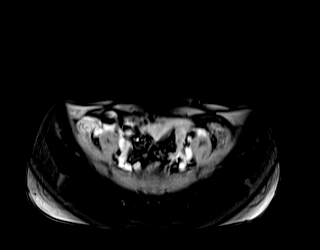
[im 22/64]
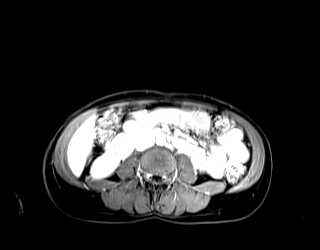
[im 43/64]
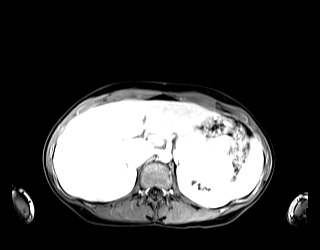
[im 64/64]
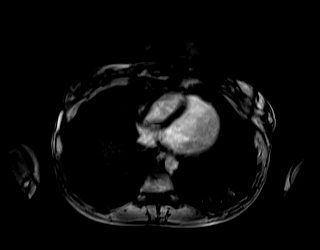

[Series 15: axial ssfse / · axial · 6.0mm · 1.19mm/px · z∈[-49,+174]mm · 2 of 32 slices shown]
[im 1/32]
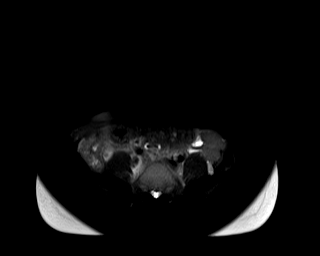
[im 32/32]
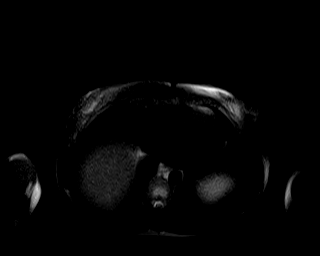

[Series 16: axial dynamic 3 · axial · 4.0mm · 1.19mm/px · z∈[-83,+169]mm · 4 of 64 slices shown]
[im 1/64]
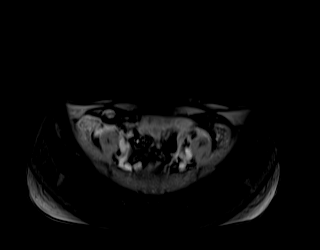
[im 22/64]
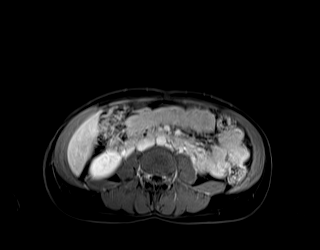
[im 43/64]
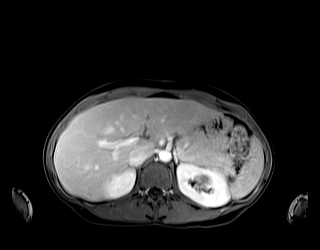
[im 64/64]
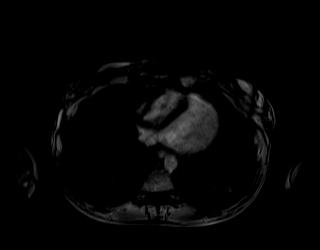

[Series 100: out of phase · axial · 6.0mm · 0.74mm/px · z∈[-49,+174]mm · 2 of 32 slices shown]
[im 1/32]
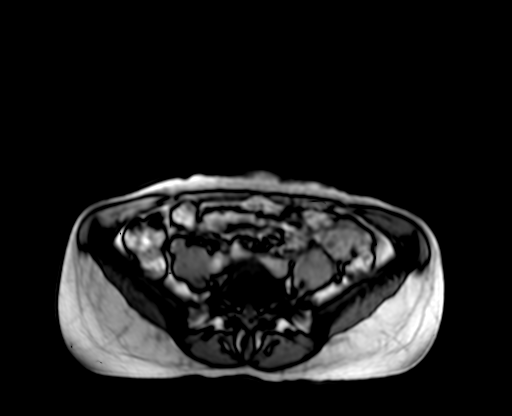
[im 32/32]
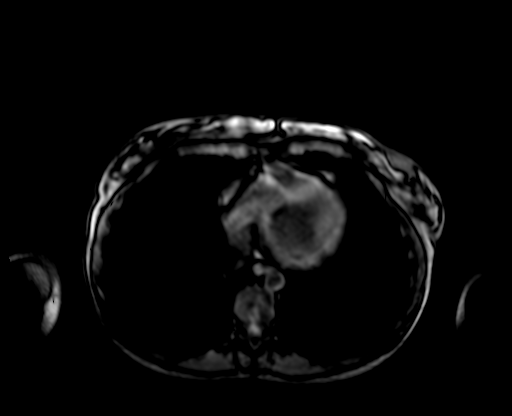

[Series 101: in phase · axial · 6.0mm · 0.74mm/px · z∈[-49,+174]mm · 2 of 32 slices shown]
[im 1/32]
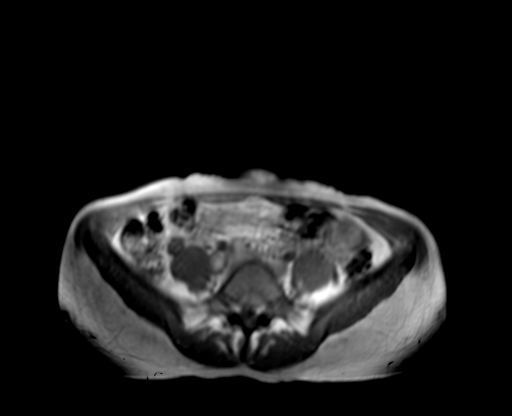
[im 32/32]
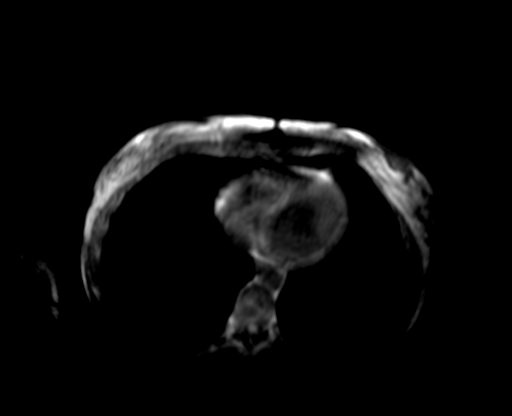

[Series 102: immed sub · axial · 4.0mm · 1.19mm/px · z∈[-83,+169]mm · 4 of 64 slices shown]
[im 1/64]
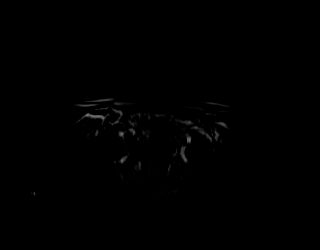
[im 22/64]
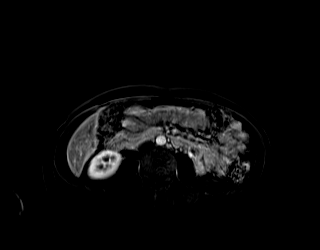
[im 43/64]
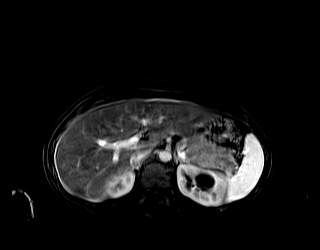
[im 64/64]
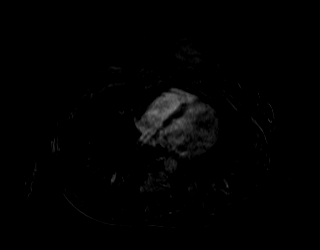

[Series 103: (id) sub · axial · 4.0mm · 1.19mm/px · z∈[-83,+169]mm · 4 of 64 slices shown (1 of 2)]
[im 1/64]
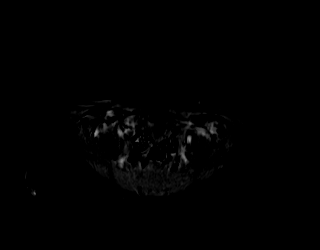
[im 22/64]
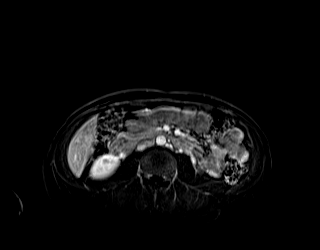
[im 43/64]
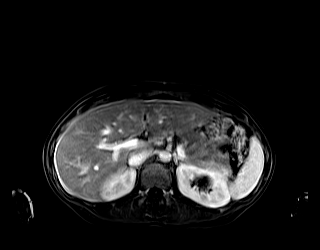
[im 64/64]
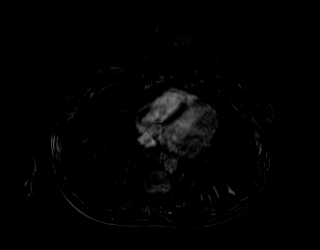

[Series 104: (id) sub · axial · 4.0mm · 1.19mm/px · z∈[-83,+169]mm · 4 of 64 slices shown (2 of 2)]
[im 1/64]
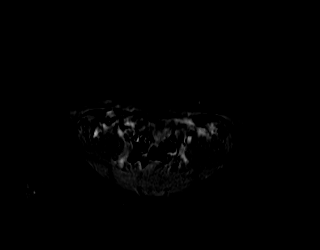
[im 22/64]
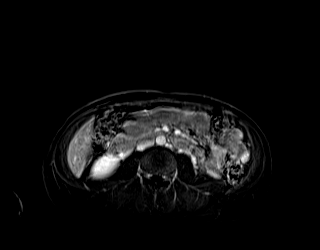
[im 43/64]
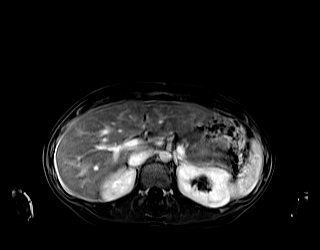
[im 64/64]
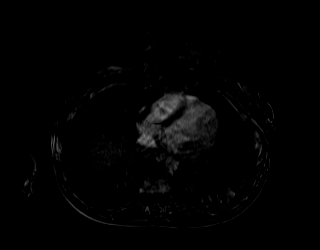

[Series 105: 3min sub · axial · 4.0mm · 1.19mm/px · z∈[-83,+169]mm · 4 of 64 slices shown]
[im 1/64]
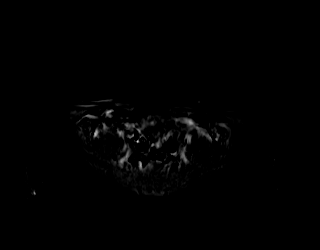
[im 22/64]
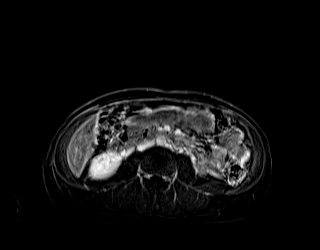
[im 43/64]
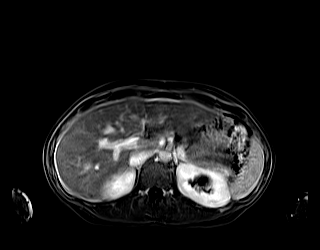
[im 64/64]
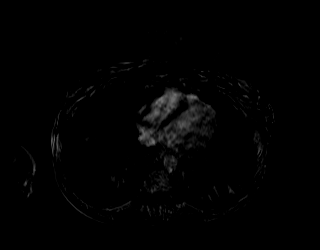

[43 of 48 positions shown; findings below may reference images not displayed]

FINDINGS: Lower chest:  No pleural effusion.  No pericardial effusion.

Hepatobiliary: No suspicious liver abnormalities. The gallbladder
appears normal. No biliary dilatation.

Pancreas: Normal appearance of the pancreas.

Spleen: The spleen is unremarkable.

Adrenals/Urinary Tract: Normal appearance of the adrenal glands.
Normal appearance of the right kidney. T2 hyperintense structure
arising from the upper pole of the left kidney measures 2.3 cm and
is unchanged from previous exam. This exhibits mild increased T1
signal indicating presence of proteinaceous or hemorrhagic debris.
No enhancement within this structure.

Stomach/Bowel: The stomach and the small bowel loops have a normal
course and caliber. The visualized portions of the colon are
unremarkable.

Vascular/Lymphatic: The abdominal aorta appears normal. There is no
upper abdominal adenopathy.

Other: There is no free fluid or fluid collections identified within
the abdomen or pelvis.

Musculoskeletal: Normal signal from within the bone marrow.
IMPRESSION: 1. There is a Bosniak category 2 cyst arising from upper pole of
left kidney. This is stable when compared with previous exam.

## 2016-11-18 ENCOUNTER — Encounter: Payer: Self-pay | Admitting: Emergency Medicine

## 2016-11-18 ENCOUNTER — Emergency Department
Admission: EM | Admit: 2016-11-18 | Discharge: 2016-11-18 | Disposition: A | Discharge status: Home / Self Care | Payer: Medicaid Other | Attending: Emergency Medicine | Admitting: Emergency Medicine

## 2016-11-18 DIAGNOSIS — Z79899 Other long term (current) drug therapy: Secondary | ICD-10-CM | POA: Insufficient documentation

## 2016-11-18 DIAGNOSIS — J0100 Acute maxillary sinusitis, unspecified: Secondary | ICD-10-CM | POA: Insufficient documentation

## 2016-11-18 DIAGNOSIS — F17210 Nicotine dependence, cigarettes, uncomplicated: Secondary | ICD-10-CM | POA: Insufficient documentation

## 2016-11-18 LAB — URINALYSIS, ROUTINE W REFLEX MICROSCOPIC
Bilirubin Urine: NEGATIVE
Glucose, UA: NEGATIVE mg/dL
Hgb urine dipstick: NEGATIVE
KETONES UR: NEGATIVE mg/dL
LEUKOCYTES UA: NEGATIVE
NITRITE: NEGATIVE
PROTEIN: NEGATIVE mg/dL
Specific Gravity, Urine: 1.015 (ref 1.005–1.030)
pH: 8 (ref 5.0–8.0)

## 2016-11-18 LAB — POCT PREGNANCY, URINE: PREG TEST UR: NEGATIVE

## 2016-11-18 MED ORDER — PSEUDOEPH-BROMPHEN-DM 30-2-10 MG/5ML PO SYRP: 5.0000 mL | ORAL_SOLUTION | Freq: Four times a day (QID) | ORAL | 0 refills | Status: DC | PRN

## 2016-11-18 MED ORDER — SULFAMETHOXAZOLE-TRIMETHOPRIM 800-160 MG PO TABS: 1.0000 | ORAL_TABLET | Freq: Two times a day (BID) | ORAL | 0 refills | Status: DC

## 2016-11-18 NOTE — ED Notes (Signed)
UA collected and sent to lab. POC performed and recorded. Pt head of bed raised for pt comfort.

## 2016-11-18 NOTE — ED Triage Notes (Signed)
Pt reports fever and cold, drinking fluids at front desk.

## 2016-11-18 NOTE — ED Notes (Addendum)
States she developed cough which is occasional prod about 11 days ago  + Green phlegm   Also states her urine is strong smelling and cloudy with some back pain

## 2016-11-18 NOTE — ED Provider Notes (Signed)
[facility] Emergency Department Provider Note   ____________________________________________   First MD Initiated Contact with Patient 11/18/16 1400     (approximate)  I have reviewed the triage vital signs and the nursing notes.   HISTORY  Chief Complaint Cough and URI    HPI Kristy Hansen is a 31 y.o. female patient complaining of 11 days of fever and chills. Patient states nasal congestion, runny nose, ear pressure. Patient also complaining urine a frequency without dysuria. Patient denies vaginal discharge.Patient rates the pain as a 7/10. Patient describes the pain as "pressure".   Past Medical History:  Diagnosis Date  . Anxiety   . Autoimmune disease (HCC)   . Chiari malformation type I (HCC)   . Hematuria, gross   . Panic attack   . Pyelonephritis   . Renal cyst     Patient Active Problem List   Diagnosis Date Noted  . Galactorrhea 06/19/2016  . Irregular menses 06/19/2016  . Renal cyst 01/05/2015  . Paresthesias 12/12/2014  . Family history of ovarian cancer 12/12/2014  . Tobacco user 12/12/2014  . Renal mass 12/05/2014  . Anxiety 10/11/2014  . Back pain, chronic 07/17/2014    Past Surgical History:  Procedure Laterality Date  . CYST REMOVAL PEDIATRIC     Spine  . MYRINGOTOMY WITH TUBE PLACEMENT      Prior to Admission medications   Medication Sig Start Date End Date Taking? Authorizing Provider  acyclovir (ZOVIRAX) 800 MG tablet Take 1 tablet (800 mg total) by mouth 2 (two) times daily. 09/29/16   Defrancesco, Prentice Docker, MD  brompheniramine-pseudoephedrine-DM 30-2-10 MG/5ML syrup Take 5 mLs by mouth 4 (four) times daily as needed. 11/18/16   Joni Reining, PA-C  metaxalone (SKELAXIN) 800 MG tablet Take 800 mg by mouth 3 (three) times daily.    [provider]  sulfamethoxazole-trimethoprim (BACTRIM DS,SEPTRA DS) 800-160 MG tablet Take 1 tablet by mouth 2 (two) times daily. 11/18/16   Joni Reining, PA-C     Allergies Penicillins; Aspirin; Naprosyn [naproxen]; and Tramadol  Family History  Problem Relation Age of Onset  . Cancer Mother        breast cancer, ovarian cancer  . Diabetes Maternal Grandmother   . Kidney disease Neg Hx   . Bladder Cancer Neg Hx   . Prostate cancer Neg Hx     Social History Social History  Substance Use Topics  . Smoking status: Current Some Day Smoker    Packs/day: 0.25    Types: Cigarettes  . Smokeless tobacco: Never Used  . Alcohol use 0.0 oz/week     Comment: occas    Review of Systems  Constitutional:  fever/chills Eyes: No visual changes. ENT:  sore throat. Cardiovascular: Denies chest pain. Respiratory: Denies shortness of breath. Gastrointestinal: No abdominal pain.  No nausea, no vomiting.  No diarrhea.  No constipation. Genitourinary: Positive urinary frequency Musculoskeletal: Negative for back pain. Skin: Negative for rash. Neurological: Negative for headaches, focal weakness or numbness. Psychiatric:Anxiety Allergic/Immunilogical: See medication list ____________________________________________   PHYSICAL EXAM:  VITAL SIGNS: ED Triage Vitals  Enc Vitals Group     BP 11/18/16 1346 (!) 135/98     Pulse Rate 11/18/16 1346 90     Resp 11/18/16 1346 14     Temp 11/18/16 1346 98.1 F (36.7 C)     Temp Source 11/18/16 1346 Oral     SpO2 11/18/16 1346 100 %     Weight 11/18/16 1347 91 lb (41.3  kg)     Height 11/18/16 1347 5\' 1"  (1.549 m)     Head Circumference --      Peak Flow --      Pain Score 11/18/16 1350 7     Pain Loc --      Pain Edu? --      Excl. in GC? --     Constitutional: Alert and oriented. Well appearing and in no acute distress. Eyes: Conjunctivae are normal. PERRL. EOMI. Head: Atraumatic. Nose: Edematous nasal turbinates with bilateral maxillary guarding. Mouth/Throat: Mucous membranes are moist.  Oropharynx non-erythematous. Postnasal drainage Neck: No stridor.  No cervical spine tenderness to  palpation. Hematological/Lymphatic/Immunilogical: No cervical lymphadenopathy. Cardiovascular: Normal rate, regular rhythm. Grossly normal heart sounds.  Good peripheral circulation. Respiratory: Normal respiratory effort.  No retractions. Lungs CTAB. Gastrointestinal: Soft and nontender. No distention. No abdominal bruits. No CVA tenderness. Musculoskeletal: No lower extremity tenderness nor edema.  No joint effusions. Neurologic:  Normal speech and language. No gross focal neurologic deficits are appreciated. No gait instability. Skin:  Skin is warm, dry and intact. No rash noted. Psychiatric: Mood and affect are normal. Speech and behavior are normal.  ____________________________________________   LABS (all labs ordered are listed, but only abnormal results are displayed)  Labs Reviewed  URINALYSIS, ROUTINE W REFLEX MICROSCOPIC - Abnormal; Notable for the following:       Result Value   Color, Urine YELLOW (*)    APPearance CLOUDY (*)    All other components within normal limits  POC URINE PREG, ED  POCT PREGNANCY, URINE   ____________________________________________  EKG   ____________________________________________  RADIOLOGY  No results found.  ____________________________________________   PROCEDURES  Procedure(s) performed: None  Procedures  Critical Care performed: No  ____________________________________________   INITIAL IMPRESSION / ASSESSMENT AND PLAN / ED COURSE  Pertinent labs & imaging results that were available during my care of the patient were reviewed by me and considered in my medical decision making (see chart for details).  Maxillary sinusitis. Patient given discharge Instructions. [facility]      ____________________________________________   FINAL CLINICAL IMPRESSION(S) / ED DIAGNOSES  Final diagnoses:  Subacute maxillary sinusitis      NEW MEDICATIONS  STARTED DURING THIS VISIT:  New Prescriptions   BROMPHENIRAMINE-PSEUDOEPHEDRINE-DM 30-2-10 MG/5ML SYRUP    Take 5 mLs by mouth 4 (four) times daily as needed.   SULFAMETHOXAZOLE-TRIMETHOPRIM (BACTRIM DS,SEPTRA DS) 800-160 MG TABLET    Take 1 tablet by mouth 2 (two) times daily.     Note:  This document was prepared using Dragon voice recognition software and may include unintentional dictation errors.    Joni ReiningSmith, Shanna Strength K, PA-C 11/18/16 1445    Jene EveryKinner, Robert, MD 11/18/16 1505

## 2016-11-21 ENCOUNTER — Other Ambulatory Visit: Payer: Self-pay | Admitting: Obstetrics and Gynecology

## 2016-11-21 MED ORDER — ACYCLOVIR 800 MG PO TABS
800.0000 mg | ORAL_TABLET | Freq: Two times a day (BID) | ORAL | 2 refills | Status: DC
Start: 2016-11-21 — End: 2017-09-01

## 2016-11-21 MED ORDER — ACYCLOVIR 800 MG PO TABS: 800.0000 mg | ORAL_TABLET | Freq: Two times a day (BID) | ORAL | 0 refills | Status: DC

## 2016-11-21 NOTE — Telephone Encounter (Signed)
Patient has an outbreak with pain  Please refill

## 2016-11-21 NOTE — Telephone Encounter (Signed)
I contacted pt and she does not need Flagyl, she needs the acyclovir refilled. 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 Pt states she tried to call but could not get through and had a family emergency.

## 2016-11-24 ENCOUNTER — Encounter: Payer: Self-pay | Admitting: Emergency Medicine

## 2016-11-24 ENCOUNTER — Emergency Department
Admission: EM | Admit: 2016-11-24 | Discharge: 2016-11-24 | Disposition: A | Discharge status: Home / Self Care | Payer: Self-pay | Attending: Emergency Medicine | Admitting: Emergency Medicine

## 2016-11-24 DIAGNOSIS — M62838 Other muscle spasm: Secondary | ICD-10-CM | POA: Insufficient documentation

## 2016-11-24 DIAGNOSIS — F17210 Nicotine dependence, cigarettes, uncomplicated: Secondary | ICD-10-CM | POA: Insufficient documentation

## 2016-11-24 MED ORDER — METAXALONE 800 MG PO TABS: 800.0000 mg | ORAL_TABLET | Freq: Three times a day (TID) | ORAL | 1 refills | Status: DC

## 2016-11-24 NOTE — ED Provider Notes (Signed)
[facility] Emergency Department Provider Note  ____________________________________________  Time seen: Approximately 6:53 PM  I have reviewed the triage vital signs and the nursing notes.   HISTORY  Chief Complaint Neck Pain and Headache    HPI Kristy Hansen is a 31 y.o. female Chiari Malformation Type I presents to the emergency department with muscle spasms. Patient states that she takes Skelaxin 3 times daily as needed for chronic muscle spasms that she has had throughout her life. Patient denies falls or mechanisms of trauma. Patient states that she is in the process of establishing care with primary care. Patient states that her next appointment with primary care is in the second week of August. Patient denies radiculopathy, weakness or changes in sensation in the upper or lower extremities. Patient denies incontinence. Patient has been trying essential oils, which temporarily relieved her symptoms.   Past Medical History:  Diagnosis Date  . Anxiety   . Autoimmune disease (HCC)   . Chiari malformation type I (HCC)   . Hematuria, gross   . Panic attack   . Pyelonephritis   . Renal cyst     Patient Active Problem List   Diagnosis Date Noted  . Galactorrhea 06/19/2016  . Irregular menses 06/19/2016  . Renal cyst 01/05/2015  . Paresthesias 12/12/2014  . Family history of ovarian cancer 12/12/2014  . Tobacco user 12/12/2014  . Renal mass 12/05/2014  . Anxiety 10/11/2014  . Back pain, chronic 07/17/2014    Past Surgical History:  Procedure Laterality Date  . CYST REMOVAL PEDIATRIC     Spine  . MYRINGOTOMY WITH TUBE PLACEMENT      Prior to Admission medications   Medication Sig Start Date End Date Taking? Authorizing Provider  acyclovir (ZOVIRAX) 800 MG tablet Take 1 tablet (800 mg total) by mouth 2 (two) times daily. 11/21/16   Shambley, Melody N, CNM  brompheniramine-pseudoephedrine-DM 30-2-10 MG/5ML syrup Take 5 mLs by mouth 4 (four)  times daily as needed. 11/18/16   Joni Reining, PA-C  metaxalone (SKELAXIN) 800 MG tablet Take 1 tablet (800 mg total) by mouth 3 (three) times daily. 11/24/16 11/24/17  Orvil Feil, PA-C  metroNIDAZOLE (FLAGYL) 500 MG tablet TAKE 1 TABLET BY MOUTH TWICE DAILY 11/21/16   Shambley, Melody N, CNM  sulfamethoxazole-trimethoprim (BACTRIM DS,SEPTRA DS) 800-160 MG tablet Take 1 tablet by mouth 2 (two) times daily. 11/18/16   Joni Reining, PA-C    Allergies Penicillins; Aspirin; Naprosyn [naproxen]; and Tramadol  Family History  Problem Relation Age of Onset  . Cancer Mother        breast cancer, ovarian cancer  . Diabetes Maternal Grandmother   . Kidney disease Neg Hx   . Bladder Cancer Neg Hx   . Prostate cancer Neg Hx     Social History Social History  Substance Use Topics  . Smoking status: Current Some Day Smoker    Packs/day: 0.25    Types: Cigarettes  . Smokeless tobacco: Never Used  . Alcohol use 0.0 oz/week     Comment: occas     Review of Systems  Constitutional: No fever/chills Eyes: No visual changes. No discharge ENT: No upper respiratory complaints. Cardiovascular: no chest pain. Respiratory: no cough. No SOB. Musculoskeletal: Patient has neck muscle spasms.  Skin: Negative for rash, abrasions, lacerations, ecchymosis. Neurological: Negative for headaches, focal weakness or numbness.  ____________________________________________   PHYSICAL EXAM:  VITAL SIGNS: ED Triage Vitals  Enc Vitals Group     BP 11/24/16 1844 Marland Kitchen)  140/96     Pulse Rate 11/24/16 1844 (!) 108     Resp 11/24/16 1844 18     Temp 11/24/16 1844 98.5 F (36.9 C)     Temp Source 11/24/16 1844 Oral     SpO2 11/24/16 1844 97 %     Weight 11/24/16 1829 91 lb (41.3 kg)     Height 11/24/16 1829 5\' 1"  (1.549 m)     Head Circumference --      Peak Flow --      Pain Score 11/24/16 1829 4     Pain Loc --      Pain Edu? --      Excl. in GC? --      Constitutional: Alert and oriented.  Well appearing and in no acute distress. Eyes: Conjunctivae are normal. PERRL. EOMI. Head: Atraumatic. Neck: Limited range of motion. No pain with palpation along the C spine.  Cardiovascular: Normal rate, regular rhythm. Normal S1 and S2.  Good peripheral circulation. Respiratory: Normal respiratory effort without tachypnea or retractions. Lungs CTAB. Good air entry to the bases with no decreased or absent breath sounds. Musculoskeletal: Full range of motion to all extremities. No gross deformities appreciated. Neurologic:  Normal speech and language. No gross focal neurologic deficits are appreciated.  Skin:  Skin is warm, dry and intact. No rash noted. Psychiatric: Mood and affect are normal. Speech and behavior are normal. Patient exhibits appropriate insight and judgement.   ____________________________________________   LABS (all labs ordered are listed, but only abnormal results are displayed)  Labs Reviewed - No data to display ____________________________________________  EKG   ____________________________________________  RADIOLOGY   No results found.  ____________________________________________    PROCEDURES  Procedure(s) performed:    Procedures    Medications - No data to display   ____________________________________________   INITIAL IMPRESSION / ASSESSMENT AND PLAN / ED COURSE  Pertinent labs & imaging results that were available during my care of the patient were reviewed by me and considered in my medical decision making (see chart for details).  Review of the [place] CSRS was performed in accordance of the [facility] prior to dispensing any controlled drugs.     Assessment and plan: Neck spasms Patient presents to the emergency department with neck muscle spasms. Patient currently takes Skelaxin 3 times daily. Patient was given a refill of her Skelaxin and advised to maintain appointment with primary care in August. Vital signs were reassuring  prior to discharge. Neurologic exam and overall physical exam was reassuring. All patient questions were answered.    ____________________________________________  FINAL CLINICAL IMPRESSION(S) / ED DIAGNOSES  Final diagnoses:  Muscle spasms of neck      NEW MEDICATIONS STARTED DURING THIS VISIT:  Discharge Medication List as of 11/24/2016  6:55 PM          This chart was dictated using voice recognition software/Dragon. Despite best efforts to proofread, errors can occur which can change the meaning. Any change was purely unintentional.    Gasper LloydWoods, Jaclyn M, PA-C 11/24/16 2134    Phineas SemenGoodman, Graydon, MD 11/24/16 [phone]

## 2016-11-24 NOTE — ED Triage Notes (Signed)
Patient presents to the ED with neck stiffness and intermittent headache x 2 weeks.  Patient is having difficulty moving her head during triage.  Patient states she has had this issue in the past.

## 2016-11-24 NOTE — ED Notes (Signed)
Pt verbalizes understanding of discharge instructions.

## 2016-11-24 NOTE — ED Notes (Signed)
See triage note  States she has had intermittent headache with some neck stiffness over the past 2 weeks  afebrile on arrival.  Able to move neck w/o diff

## 2017-03-18 ENCOUNTER — Encounter: Payer: Self-pay | Admitting: Obstetrics and Gynecology

## 2017-03-18 ENCOUNTER — Ambulatory Visit (INDEPENDENT_AMBULATORY_CARE_PROVIDER_SITE_OTHER): Payer: Self-pay | Admitting: Obstetrics and Gynecology

## 2017-03-18 VITALS — BP 133/87 | HR 92 | Temp 97.7°F | Ht 60.0 in | Wt 92.8 lb

## 2017-03-18 DIAGNOSIS — N898 Other specified noninflammatory disorders of vagina: Secondary | ICD-10-CM

## 2017-03-18 DIAGNOSIS — R350 Frequency of micturition: Secondary | ICD-10-CM

## 2017-03-18 DIAGNOSIS — R102 Pelvic and perineal pain: Secondary | ICD-10-CM

## 2017-03-18 DIAGNOSIS — N926 Irregular menstruation, unspecified: Secondary | ICD-10-CM

## 2017-03-18 LAB — POCT URINALYSIS DIPSTICK
GLUCOSE UA: NEGATIVE
Ketones, UA: NEGATIVE
Leukocytes, UA: NEGATIVE
Nitrite, UA: NEGATIVE
Spec Grav, UA: 1.02 (ref 1.010–1.025)
UROBILINOGEN UA: 0.2 U/dL
pH, UA: 7 (ref 5.0–8.0)

## 2017-03-18 LAB — POCT URINE PREGNANCY: PREG TEST UR: NEGATIVE

## 2017-03-18 MED ORDER — AZITHROMYCIN 1 G PO PACK: 1.0000 g | PACK | Freq: Once | ORAL | 0 refills | Status: AC

## 2017-03-18 MED ORDER — METAXALONE 800 MG PO TABS: 800.0000 mg | ORAL_TABLET | Freq: Three times a day (TID) | ORAL | 0 refills | Status: DC

## 2017-03-18 NOTE — Patient Instructions (Signed)
1. Urinalysis shows blood in the urine 2. Urine pregnancy test is negative 3. Zithromax 1 g orally is described for pelvic pain/uterine tenderness 4. Skelaxin prescription is refilled. 5. New swab plus is obtained to rule out infection 6. Return as needed

## 2017-03-18 NOTE — Progress Notes (Signed)
Subjective   Kristy Hansen is a 31 y/o [phone] female that presents to clinic for evaluation of urinary tract infection symptoms. Symptoms include frequency and urgency that have been present for approximately 4-5 days. Additional sx include low back pain x 3 days, mild abdominal cramping and white vaginal discharge. Denies vaginal pain or odorous discharge. Denies dysuria, hematuria, fever, N/V, constipation, diarrhea. LMP began today. Not currently using any form of contraception.   OB History    Gravida Para Term Preterm AB Living   SAB TAB Ectopic Multiple Live Births           4     Past Medical History:  Diagnosis Date  . Anxiety   . Autoimmune disease (HCC)   . Chiari malformation type I (HCC)   . Hematuria, gross   . Panic attack   . Pyelonephritis   . Renal cyst    Past Surgical History:  Procedure Laterality Date  . CYST REMOVAL PEDIATRIC     Spine  . MYRINGOTOMY WITH TUBE PLACEMENT     Family History  Problem Relation Age of Onset  . Cancer Mother        breast cancer, ovarian cancer  . Diabetes Maternal Grandmother   . Kidney disease Neg Hx   . Bladder Cancer Neg Hx   . Prostate cancer Neg Hx    Social History   Social History  . Marital status: Legally Separated    Spouse name: N/A  . Number of children: N/A  . Years of education: N/A   Occupational History  . Not on file.   Social History Main Topics  . Smoking status: Current Some Day Smoker    Packs/day: 0.25    Types: Cigarettes  . Smokeless tobacco: Never Used  . Alcohol use 0.0 oz/week     Comment: occas  . Drug use: No  . Sexual activity: Yes    Birth control/ protection: Condom   Other Topics Concern  . Not on file   Social History Narrative  . No narrative on file   Review of Systems  Constitutional: Negative for chills, fever, malaise/fatigue and weight loss.  HENT: Negative for sore throat.   Eyes: Negative for blurred vision and pain.  Respiratory: Negative  for cough, shortness of breath and wheezing.   Cardiovascular: Negative for chest pain, palpitations, claudication and leg swelling.  Gastrointestinal: Negative for nausea and vomiting.  Skin: Negative for rash.  Neurological: Negative for dizziness, loss of consciousness and headaches.   Objective   BP 133/87   Pulse 92   Temp 97.7 F (36.5 C)   Ht 5' (1.524 m)   Wt 92 lb 12.8 oz (42.1 kg)   LMP 03/18/2017 (Exact Date)   BMI 18.12 kg/m  CONSTITUTIONAL: well-developed, well-nourished, anxious appearing female in no acute distress NEUROLOGIC: alert and oriented to person, place, and time. Normal muscle tone coordination. No cranial nerve deficit noted.  HENT: Normocephalic, atraumatic. External right and left ear normal. Oropharynx is clear and moist.  EYES: Conjunctivae and EOM are normal. Pupils are equal, round, and reactive to light. No scleral icterus.  SKIN: skin is warm and dry. No rash noted. Not diaphoretic. No erythema. No pallor.  CARDIOVASCULAR: normal heart rate noted, regular rhythm, no murmur. RESPIRATORY: clear to auscultation bilaterally. Effort and breath sounds normal, no problems with respiration noted.  BREASTS: not examined ABDOMEN: soft, normal bowel sounds, no distention noted. Mild suprapubic  tenderness. No rebound or guarding.  BACK: bilateral CVAT PELVIC:  External genitalia: normal  BUS: normal  Cervix: normal, mild cervical motion tenderness; no significant discharge, white gray secretions present  Uterus: small, anteverted, slightly tender 1/4  Adnexa: normal, nonpalpable, nontender  RV: external exam normal MUSCULOSKELETAL: normal range of motion. No tenderness. No cyanosis, clubbing, or edema.   Assessment   1. Urinary frequency, urgency 2. Uterine tenderness, possible PID 3. Mild cervical motion tenderness  4. Irregular menses  Plan   1. Urinalysis WNL, culture sent 2. Nu swab plus 3. Rx Zithromax 1g powder PO 4. Encouraged pt to drink  lots of water 5. Return as needed 6. Skelaxin prescription is refilled; patient to get new primary care provider for further ongoing management of back pain  Arther Abbott, PA-S Herold Harms, MD   I have seen, interviewed, and examined the patient in conjunction with the [facility] [employer] student and affirm the diagnosis and management plan. Gwenith Tschida A. Mcclain Shall, MD, FACOG   Note: This dictation was prepared with Dragon dictation along with smaller phrase technology. Any transcriptional errors that result from this process are unintentional.

## 2017-03-20 LAB — URINE CULTURE

## 2017-03-24 LAB — NUSWAB VAGINITIS PLUS (VG+)
CANDIDA ALBICANS, NAA: NEGATIVE
CANDIDA GLABRATA, NAA: NEGATIVE
Chlamydia trachomatis, NAA: NEGATIVE
Neisseria gonorrhoeae, NAA: NEGATIVE
Trich vag by NAA: NEGATIVE

## 2017-05-06 ENCOUNTER — Emergency Department
Admission: EM | Admit: 2017-05-06 | Discharge: 2017-05-06 | Disposition: A | Discharge status: Home / Self Care | Payer: Self-pay | Attending: Emergency Medicine | Admitting: Emergency Medicine

## 2017-05-06 ENCOUNTER — Other Ambulatory Visit: Payer: Self-pay

## 2017-05-06 ENCOUNTER — Encounter: Payer: Self-pay | Admitting: *Deleted

## 2017-05-06 DIAGNOSIS — S60511A Abrasion of right hand, initial encounter: Secondary | ICD-10-CM | POA: Insufficient documentation

## 2017-05-06 DIAGNOSIS — Y92000 Kitchen of unspecified non-institutional (private) residence as  the place of occurrence of the external cause: Secondary | ICD-10-CM | POA: Insufficient documentation

## 2017-05-06 DIAGNOSIS — Y93G1 Activity, food preparation and clean up: Secondary | ICD-10-CM | POA: Insufficient documentation

## 2017-05-06 DIAGNOSIS — F17210 Nicotine dependence, cigarettes, uncomplicated: Secondary | ICD-10-CM | POA: Insufficient documentation

## 2017-05-06 DIAGNOSIS — W268XXA Contact with other sharp object(s), not elsewhere classified, initial encounter: Secondary | ICD-10-CM | POA: Insufficient documentation

## 2017-05-06 DIAGNOSIS — Z23 Encounter for immunization: Secondary | ICD-10-CM | POA: Insufficient documentation

## 2017-05-06 DIAGNOSIS — Y999 Unspecified external cause status: Secondary | ICD-10-CM | POA: Insufficient documentation

## 2017-05-06 DIAGNOSIS — Z79899 Other long term (current) drug therapy: Secondary | ICD-10-CM | POA: Insufficient documentation

## 2017-05-06 MED ORDER — SULFAMETHOXAZOLE-TRIMETHOPRIM 800-160 MG PO TABS: 1.0000 | ORAL_TABLET | Freq: Two times a day (BID) | ORAL | 0 refills | Status: AC

## 2017-05-06 MED ORDER — TETANUS-DIPHTH-ACELL PERTUSSIS 5-2.5-18.5 LF-MCG/0.5 IM SUSP
0.5000 mL | Freq: Once | INTRAMUSCULAR | Status: AC
  Administered 2017-05-06: 0.5 mL via INTRAMUSCULAR
  Filled 2017-05-06: qty 0.5

## 2017-05-06 NOTE — ED Provider Notes (Signed)
[facility] Emergency Department Provider Note  ____________________________________________  Time seen: Approximately 10:56 PM  I have reviewed the triage vital signs and the nursing notes.   HISTORY  Chief Complaint Laceration    HPI Kristy Hansen is a 31 y.o. female with a history of anxiety and panic attacks, presents to the emergency department with a 2 cm abrasion of the right palm sustained after she broke a dish while washing dishes.  She denies weakness, radiculopathy or changes in sensation in the upper extremities.  Patient reports that is been more than 10 years since her last tetanus shot.   Past Medical History:  Diagnosis Date  . Anxiety   . Autoimmune disease (HCC)   . Chiari malformation type I (HCC)   . Hematuria, gross   . Panic attack   . Pyelonephritis   . Renal cyst     Patient Active Problem List   Diagnosis Date Noted  . Galactorrhea 06/19/2016  . Irregular menses 06/19/2016  . Renal cyst 01/05/2015  . Paresthesias 12/12/2014  . Family history of ovarian cancer 12/12/2014  . Tobacco user 12/12/2014  . Renal mass 12/05/2014  . Anxiety 10/11/2014  . Back pain, chronic 07/17/2014    Past Surgical History:  Procedure Laterality Date  . CYST REMOVAL PEDIATRIC     Spine  . MYRINGOTOMY WITH TUBE PLACEMENT      Prior to Admission medications   Medication Sig Start Date End Date Taking? Authorizing Provider  acyclovir (ZOVIRAX) 800 MG tablet Take 1 tablet (800 mg total) by mouth 2 (two) times daily. 11/21/16   Shambley, Melody N, CNM  loratadine (CLARITIN) 10 MG tablet Take 10 mg by mouth daily.    [provider]  metaxalone (SKELAXIN) 800 MG tablet Take 1 tablet (800 mg total) by mouth 3 (three) times daily. 03/18/17 03/18/18  Defrancesco, Prentice DockerMartin A, MD  sulfamethoxazole-trimethoprim (BACTRIM DS,SEPTRA DS) 800-160 MG tablet Take 1 tablet by mouth 2 (two) times daily for 7 days. 05/06/17 05/13/17  Orvil FeilWoods, Edda Orea M,  PA-C    Allergies Penicillins; Aspirin; Naprosyn [naproxen]; and Tramadol  Family History  Problem Relation Age of Onset  . Cancer Mother        breast cancer, ovarian cancer  . Diabetes Maternal Grandmother   . Kidney disease Neg Hx   . Bladder Cancer Neg Hx   . Prostate cancer Neg Hx     Social History Social History   Tobacco Use  . Smoking status: Current Some Day Smoker    Packs/day: 0.25    Types: Cigarettes  . Smokeless tobacco: Never Used  Substance Use Topics  . Alcohol use: Yes    Alcohol/week: 0.0 oz    Comment: occas  . Drug use: No     Review of Systems  Constitutional: No fever/chills Eyes: No visual changes. No discharge ENT: No upper respiratory complaints. Cardiovascular: no chest pain. Respiratory: no cough. No SOB. Gastrointestinal: No abdominal pain.  No nausea, no vomiting.  No diarrhea.  No constipation. Genitourinary: Negative for dysuria. No hematuria Musculoskeletal: Negative for musculoskeletal pain. Skin: Patient has right hand abrasion.  Neurological: Negative for headaches, focal weakness or numbness.   ____________________________________________   PHYSICAL EXAM:  VITAL SIGNS: ED Triage Vitals [05/06/17 2104]  Enc Vitals Group     BP 125/86     Pulse Rate 86     Resp 20     Temp 98.1 F (36.7 C)     Temp Source Oral  SpO2 99 %     Weight 91 lb (41.3 kg)     Height 5\' 1"  (1.549 m)     Head Circumference      Peak Flow      Pain Score 3     Pain Loc      Pain Edu?      Excl. in GC?      Constitutional: Alert and oriented. Well appearing and in no acute distress. Eyes: Conjunctivae are normal. PERRL. EOMI. Head: Atraumatic.  Cardiovascular: Normal rate, regular rhythm. Normal S1 and S2.  Good peripheral circulation. Respiratory: Normal respiratory effort without tachypnea or retractions. Lungs CTAB. Good air entry to the bases with no decreased or absent breath sounds. Musculoskeletal: Full range of motion to  all extremities. No gross deformities appreciated. Neurologic:  Normal speech and language. No gross focal neurologic deficits are appreciated.  Skin: Patient has 2 cm abrasion to right palm. Psychiatric: Mood and affect are normal. Speech and behavior are normal. Patient exhibits appropriate insight and judgement.   ____________________________________________   LABS (all labs ordered are listed, but only abnormal results are displayed)  Labs Reviewed - No data to display ____________________________________________  EKG   ____________________________________________  RADIOLOGY   No results found.  ____________________________________________    PROCEDURES  Procedure(s) performed:    Procedures    Medications  Tdap (BOOSTRIX) injection 0.5 mL (not administered)     ____________________________________________   INITIAL IMPRESSION / ASSESSMENT AND PLAN / ED COURSE  Pertinent labs & imaging results that were available during my care of the patient were reviewed by me and considered in my medical decision making (see chart for details).  Review of the [place] CSRS was performed in accordance of the [facility] prior to dispensing any controlled drugs.     Assessment and plan Abrasion Patient presents to the emergency department with a 2 cm abrasion of the right palm.  basic wound care was provided in the emergency department.  As patient was concerned regarding the severity of her injury, she was treated empirically with Bactrim.  Patient was advised to follow-up with primary care as needed.  Tylenol is recommended for pain.  All patient questions were answered.    ____________________________________________  FINAL CLINICAL IMPRESSION(S) / ED DIAGNOSES  Final diagnoses:  Abrasion of right hand, initial encounter      NEW MEDICATIONS STARTED DURING THIS VISIT:  ED Discharge Orders        Ordered    sulfamethoxazole-trimethoprim (BACTRIM DS,SEPTRA DS)  800-160 MG tablet  2 times daily     05/06/17 2251          This chart was dictated using voice recognition software/Dragon. Despite best efforts to proofread, errors can occur which can change the meaning. Any change was purely unintentional.    Orvil FeilWoods, Robson Trickey M, PA-C 05/06/17 2308    Phineas SemenGoodman, Graydon, MD 05/09/17 [phone]

## 2017-05-06 NOTE — ED Triage Notes (Signed)
Pt has superficial laceration to right palm.  Cut self on glass while washing dishes at 1130 this morning.  Bleeding controlled.

## 2017-07-20 ENCOUNTER — Encounter: Payer: Self-pay | Admitting: Medical Oncology

## 2017-07-20 ENCOUNTER — Emergency Department
Admission: EM | Admit: 2017-07-20 | Discharge: 2017-07-20 | Disposition: A | Discharge status: Home / Self Care | Payer: Self-pay | Attending: Emergency Medicine | Admitting: Emergency Medicine

## 2017-07-20 DIAGNOSIS — Z79899 Other long term (current) drug therapy: Secondary | ICD-10-CM | POA: Insufficient documentation

## 2017-07-20 DIAGNOSIS — F17210 Nicotine dependence, cigarettes, uncomplicated: Secondary | ICD-10-CM | POA: Insufficient documentation

## 2017-07-20 DIAGNOSIS — Q0700 Arnold-Chiari syndrome without spina bifida or hydrocephalus: Secondary | ICD-10-CM | POA: Insufficient documentation

## 2017-07-20 DIAGNOSIS — R300 Dysuria: Secondary | ICD-10-CM | POA: Insufficient documentation

## 2017-07-20 LAB — CHLAMYDIA/NGC RT PCR (ARMC ONLY)
Chlamydia Tr: NOT DETECTED
N GONORRHOEAE: NOT DETECTED

## 2017-07-20 LAB — URINALYSIS, COMPLETE (UACMP) WITH MICROSCOPIC
BACTERIA UA: NONE SEEN
Bilirubin Urine: NEGATIVE
GLUCOSE, UA: NEGATIVE mg/dL
HGB URINE DIPSTICK: NEGATIVE
Ketones, ur: NEGATIVE mg/dL
LEUKOCYTES UA: NEGATIVE
NITRITE: NEGATIVE
PH: 7 (ref 5.0–8.0)
PROTEIN: NEGATIVE mg/dL
Specific Gravity, Urine: 1.014 (ref 1.005–1.030)
WBC UA: NONE SEEN WBC/hpf (ref 0–5)

## 2017-07-20 LAB — WET PREP, GENITAL
CLUE CELLS WET PREP: NONE SEEN
SPERM: NONE SEEN
Trich, Wet Prep: NONE SEEN
YEAST WET PREP: NONE SEEN

## 2017-07-20 MED ORDER — METAXALONE 800 MG PO TABS: 800.0000 mg | ORAL_TABLET | Freq: Three times a day (TID) | ORAL | 0 refills | Status: DC

## 2017-07-20 NOTE — ED Notes (Signed)
POC preg negative.

## 2017-07-20 NOTE — ED Notes (Signed)
Jasmine from [facility] [address] called to clarify the metaxalone as patient says she was supposed to get for 30 days.  Per susan fisher pa, change quantity to 90 pills.

## 2017-07-20 NOTE — ED Notes (Signed)
Pt given cup for UA.

## 2017-07-20 NOTE — ED Triage Notes (Signed)
Pt reports hx of uti and she began having sx's of one about 1 week ago. Denies fever.

## 2017-07-20 NOTE — ED Provider Notes (Signed)
[facility] Emergency Department Provider Note  ____________________________________________   First MD Initiated Contact with Patient 07/20/17 1116     (approximate)  I have reviewed the triage vital signs and the nursing notes.   HISTORY  Chief Complaint Urinary Tract Infection    HPI Ameliya Nicotra Omahoney is a 32 y.o. female since to the emergency department complaining of burning with urination.  She states she does have a small amount of discharge.  She thinks this may just be a normal vaginal discharge.  She has not had unprotected sex.  States she is condoms every time she has sex.  She denies any fever or chills.  She has a history of UTIs.  She states she gets them frequently.  Past Medical History:  Diagnosis Date  . Anxiety   . Autoimmune disease (HCC)   . Chiari malformation type I (HCC)   . Hematuria, gross   . Panic attack   . Pyelonephritis   . Renal cyst     Patient Active Problem List   Diagnosis Date Noted  . Galactorrhea 06/19/2016  . Irregular menses 06/19/2016  . Renal cyst 01/05/2015  . Paresthesias 12/12/2014  . Family history of ovarian cancer 12/12/2014  . Tobacco user 12/12/2014  . Renal mass 12/05/2014  . Anxiety 10/11/2014  . Back pain, chronic 07/17/2014    Past Surgical History:  Procedure Laterality Date  . CYST REMOVAL PEDIATRIC     Spine  . MYRINGOTOMY WITH TUBE PLACEMENT      Prior to Admission medications   Medication Sig Start Date End Date Taking? Authorizing Provider  acyclovir (ZOVIRAX) 800 MG tablet Take 1 tablet (800 mg total) by mouth 2 (two) times daily. 11/21/16   Shambley, Melody N, CNM  loratadine (CLARITIN) 10 MG tablet Take 10 mg by mouth daily.    [provider]  metaxalone (SKELAXIN) 800 MG tablet Take 1 tablet (800 mg total) by mouth 3 (three) times daily. 07/20/17 07/20/18  Faythe Ghee, PA-C    Allergies Penicillins; Aspirin; Naprosyn [naproxen]; and Tramadol  Family  History  Problem Relation Age of Onset  . Cancer Mother        breast cancer, ovarian cancer  . Diabetes Maternal Grandmother   . Kidney disease Neg Hx   . Bladder Cancer Neg Hx   . Prostate cancer Neg Hx     Social History Social History   Tobacco Use  . Smoking status: Current Some Day Smoker    Packs/day: 0.25    Types: Cigarettes  . Smokeless tobacco: Never Used  Substance Use Topics  . Alcohol use: Yes    Alcohol/week: 0.0 oz    Comment: occas  . Drug use: No    Review of Systems  Constitutional: No fever/chills Eyes: No visual changes. ENT: No sore throat. Respiratory: Denies cough Cardiac denies chest pain Gastrointestinal: Denies vomiting or diarrhea Genitourinary: Positive for dysuria and cloudy urine, some vaginal discharge Musculoskeletal: Negative for back pain. Skin: Negative for rash.    ____________________________________________   PHYSICAL EXAM:  VITAL SIGNS: ED Triage Vitals  Enc Vitals Group     BP 07/20/17 1032 (!) 141/93     Pulse Rate 07/20/17 1032 89     Resp 07/20/17 1032 15     Temp 07/20/17 1032 98.1 F (36.7 C)     Temp Source 07/20/17 1032 Oral     SpO2 07/20/17 1032 100 %     Weight 07/20/17 1033 91 lb (41.3 kg)  Height 07/20/17 1033 5\' 1"  (1.549 m)     Head Circumference --      Peak Flow --      Pain Score 07/20/17 1033 4     Pain Loc --      Pain Edu? --      Excl. in GC? --     Constitutional: Alert and oriented. Well appearing and in no acute distress. Eyes: Conjunctivae are normal.  Head: Atraumatic. Nose: No congestion/rhinnorhea. Mouth/Throat: Mucous membranes are moist.   Cardiovascular: Normal rate, regular rhythm.  Heart sounds are normal Respiratory: Normal respiratory effort.  No retractions, lungs clear to auscultation GU: Pelvic exam shows no lesions on the external labia.  The cervix does not appear to be irritated.  There is very scant discharge noted. Musculoskeletal: FROM all extremities, warm  and well perfused Neurologic:  Normal speech and language.  Skin:  Skin is warm, dry and intact. No rash noted. Psychiatric: Mood and affect are normal. Speech and behavior are normal.  ____________________________________________   LABS (all labs ordered are listed, but only abnormal results are displayed)  Labs Reviewed  WET PREP, GENITAL - Abnormal; Notable for the following components:      Result Value   WBC, Wet Prep HPF POC RARE (*)    All other components within normal limits  URINALYSIS, COMPLETE (UACMP) WITH MICROSCOPIC - Abnormal; Notable for the following components:   Color, Urine YELLOW (*)    APPearance TURBID (*)    Squamous Epithelial / LPF 0-5 (*)    All other components within normal limits  CHLAMYDIA/NGC RT PCR ([facility] ONLY)  URINE CULTURE  POC URINE PREG, ED   ____________________________________________   ____________________________________________  RADIOLOGY    ____________________________________________   PROCEDURES  Procedure(s) performed: No  Procedures    ____________________________________________   INITIAL IMPRESSION / ASSESSMENT AND PLAN / ED COURSE  Pertinent labs & imaging results that were available during my care of the patient were reviewed by me and considered in my medical decision making (see chart for details).  Patient is a 32 year old female presenting to the emergency department complaining of UTI symptoms.  She states she has a history of frequent UTIs.  She has seen a urologist before but does not have insurance at this time so she cannot see them.  She also has a small amount of vaginal discharge.  On physical exam she appears well.  Pelvic exam is basically benign  Wet prep shows rare white blood cells, point-of-care pregnancy is negative    ----------------------------------------- 2:58 PM on 07/20/2017 -----------------------------------------  Urinalysis is negative.  Ordered urine culture.   Discussed the results with the patient.  At this time I don't think an antibiotic is appropriate.  The patient agrees with this treatment.  She is requesting a prescription for her Skelaxin.  She has a neurologic disorder.  She is not have insurance at this time cannot see her regular doctor.  I agreed to 1 month of Skelaxin.  Explained to her that we could not be her primary care provider and continue to refill medications.  Patient states she understands.  He is to give a active phone number to the nurse on discharge so that we may call her if her urine culture is positive.  Patient states she understands all instructions and will comply.  She was discharged in stable condition  As part of my medical decision making, I reviewed the following data within the electronic MEDICAL RECORD NUMBER Nursing notes reviewed and  incorporated, Labs reviewed wet prep, urinalysis, GC\chlamydia, Old chart reviewed, Notes from prior ED visits and [place] Controlled Substance Database  ____________________________________________   FINAL CLINICAL IMPRESSION(S) / ED DIAGNOSES  Final diagnoses:  Dysuria      NEW MEDICATIONS STARTED DURING THIS VISIT:  Discharge Medication List as of 07/20/2017  2:09 PM       Note:  This document was prepared using Dragon voice recognition software and may include unintentional dictation errors.    Faythe GheeFisher, Jojo Geving W, PA-C 07/20/17 1506    Merrily Brittleifenbark, Neil, MD 07/20/17 [phone]

## 2017-07-20 NOTE — Discharge Instructions (Signed)
Your urine and other test were negative today.  We have ordered a urine culture which will not be back for about 2 days.  This will determine whether you have any bacteria in your urine or not.  If it is positive a nurse will call you and call in an antibiotic.  If you are worsening please return the emergency department

## 2017-07-22 LAB — URINE CULTURE

## 2017-08-14 ENCOUNTER — Emergency Department
Admission: EM | Admit: 2017-08-14 | Discharge: 2017-08-14 | Disposition: A | Discharge status: Home / Self Care | Payer: Self-pay | Attending: Emergency Medicine | Admitting: Emergency Medicine

## 2017-08-14 ENCOUNTER — Other Ambulatory Visit: Payer: Self-pay

## 2017-08-14 ENCOUNTER — Emergency Department: Payer: Self-pay

## 2017-08-14 DIAGNOSIS — Z79899 Other long term (current) drug therapy: Secondary | ICD-10-CM | POA: Insufficient documentation

## 2017-08-14 DIAGNOSIS — R05 Cough: Secondary | ICD-10-CM

## 2017-08-14 DIAGNOSIS — F17210 Nicotine dependence, cigarettes, uncomplicated: Secondary | ICD-10-CM | POA: Insufficient documentation

## 2017-08-14 DIAGNOSIS — R059 Cough, unspecified: Secondary | ICD-10-CM

## 2017-08-14 DIAGNOSIS — J209 Acute bronchitis, unspecified: Secondary | ICD-10-CM | POA: Insufficient documentation

## 2017-08-14 MED ORDER — HYDROCOD POLST-CPM POLST ER 10-8 MG/5ML PO SUER: 5.0000 mL | Freq: Two times a day (BID) | ORAL | 0 refills | Status: DC

## 2017-08-14 MED ORDER — HYDROCOD POLST-CPM POLST ER 10-8 MG/5ML PO SUER
5.0000 mL | Freq: Once | ORAL | Status: AC
  Administered 2017-08-14: 5 mL via ORAL
  Filled 2017-08-14: qty 5

## 2017-08-14 MED ORDER — METHYLPREDNISOLONE 4 MG PO TBPK: ORAL_TABLET | ORAL | 0 refills | Status: DC

## 2017-08-14 MED ORDER — ALBUTEROL SULFATE HFA 108 (90 BASE) MCG/ACT IN AERS
2.0000 | INHALATION_SPRAY | RESPIRATORY_TRACT | 0 refills | Status: DC | PRN
Start: 2017-08-14 — End: 2017-09-01

## 2017-08-14 NOTE — ED Provider Notes (Signed)
[facility] Emergency Department Provider Note   ____________________________________________   First MD Initiated Contact with Patient 08/14/17 [phone]     (approximate)  I have reviewed the triage vital signs and the nursing notes.   HISTORY  Chief Complaint Cough    HPI Kristy Hansen is a 32 y.o. female who presents to the ED from home with a chief complaint of cough and cold symptoms.  Patient reports a 2-week history of nonproductive cough, nasal congestion and runny nose.  Has a history of bronchitis and this feels the same.  Notes occasional wheezing.  Denies associated fever, chills, chest pain, shortness of breath, abdominal pain, nausea, vomiting, diarrhea.  Denies recent travel or trauma.   Past Medical History:  Diagnosis Date  . Anxiety   . Autoimmune disease (HCC)   . Chiari malformation type I (HCC)   . Hematuria, gross   . Panic attack   . Pyelonephritis   . Renal cyst     Patient Active Problem List   Diagnosis Date Noted  . Galactorrhea 06/19/2016  . Irregular menses 06/19/2016  . Renal cyst 01/05/2015  . Paresthesias 12/12/2014  . Family history of ovarian cancer 12/12/2014  . Tobacco user 12/12/2014  . Renal mass 12/05/2014  . Anxiety 10/11/2014  . Back pain, chronic 07/17/2014    Past Surgical History:  Procedure Laterality Date  . CYST REMOVAL PEDIATRIC     Spine  . MYRINGOTOMY WITH TUBE PLACEMENT      Prior to Admission medications   Medication Sig Start Date End Date Taking? Authorizing Provider  acyclovir (ZOVIRAX) 800 MG tablet Take 1 tablet (800 mg total) by mouth 2 (two) times daily. 11/21/16   Shambley, Melody N, CNM  albuterol (PROVENTIL HFA;VENTOLIN HFA) 108 (90 Base) MCG/ACT inhaler Inhale 2 puffs into the lungs every 4 (four) hours as needed for wheezing or shortness of breath. 08/14/17   Irean Hong, MD  chlorpheniramine-HYDROcodone (TUSSIONEX PENNKINETIC ER) 10-8 MG/5ML SUER Take 5 mLs by mouth 2  (two) times daily. 08/14/17   Irean Hong, MD  loratadine (CLARITIN) 10 MG tablet Take 10 mg by mouth daily.    [provider]  metaxalone (SKELAXIN) 800 MG tablet Take 1 tablet (800 mg total) by mouth 3 (three) times daily. 07/20/17 07/20/18  Sherrie Mustache Roselyn Bering, PA-C  methylPREDNISolone (MEDROL DOSEPAK) 4 MG TBPK tablet Take as directed 08/14/17   Irean Hong, MD    Allergies Penicillins; Aspirin; Naprosyn [naproxen]; and Tramadol  Family History  Problem Relation Age of Onset  . Cancer Mother        breast cancer, ovarian cancer  . Diabetes Maternal Grandmother   . Kidney disease Neg Hx   . Bladder Cancer Neg Hx   . Prostate cancer Neg Hx     Social History Social History   Tobacco Use  . Smoking status: Current Some Day Smoker    Packs/day: 0.25    Types: Cigarettes  . Smokeless tobacco: Never Used  Substance Use Topics  . Alcohol use: Yes    Alcohol/week: 0.0 oz    Comment: occas  . Drug use: No    Review of Systems  Constitutional: No fever/chills. Eyes: No visual changes. ENT: No sore throat. Cardiovascular: Denies chest pain. Respiratory: Positive for cough and occasional wheezing. Denies shortness of breath. Gastrointestinal: No abdominal pain.  No nausea, no vomiting.  No diarrhea.  No constipation. Genitourinary: Negative for dysuria. Musculoskeletal: Negative for back pain. Skin: Negative for  rash. Neurological: Negative for headaches, focal weakness or numbness.   ____________________________________________   PHYSICAL EXAM:  VITAL SIGNS: ED Triage Vitals  Enc Vitals Group     BP 08/14/17 0133 (!) 144/102     Pulse Rate 08/14/17 0132 (!) 102     Resp 08/14/17 0132 (!) 24     Temp 08/14/17 0132 98.1 F (36.7 C)     Temp Source 08/14/17 0132 Oral     SpO2 08/14/17 0132 99 %     Weight 08/14/17 0132 93 lb (42.2 kg)     Height --      Head Circumference --      Peak Flow --      Pain Score 08/14/17 0132 0     Pain Loc --      Pain Edu?  --      Excl. in GC? --     Constitutional: Alert and oriented. Well appearing and in no acute distress. Eyes: Conjunctivae are normal. PERRL. EOMI. Head: Atraumatic. Nose: Congestion/rhinnorhea. Mouth/Throat: Mucous membranes are moist.  Oropharynx non-erythematous. Neck: No stridor.  Supple neck without meningismus. Cardiovascular: Normal rate, regular rhythm. Grossly normal heart sounds.  Good peripheral circulation. Respiratory: Normal respiratory effort.  No retractions. Lungs CTAB. Gastrointestinal: Soft and nontender. No distention. No abdominal bruits. No CVA tenderness. Musculoskeletal: No lower extremity tenderness nor edema.  No joint effusions. Neurologic:  Normal speech and language. No gross focal neurologic deficits are appreciated. No gait instability. Skin:  Skin is warm, dry and intact. No rash noted.  No petechiae. Psychiatric: Mood and affect are normal. Speech and behavior are normal.  ____________________________________________   LABS (all labs ordered are listed, but only abnormal results are displayed)  Labs Reviewed - No data to display ____________________________________________  EKG  None ____________________________________________  RADIOLOGY  ED MD interpretation: No pneumonia  Official radiology report(s): Dg Chest 2 View  Result Date: 08/14/2017 CLINICAL DATA:  Cough and cold symptoms for 2 weeks. EXAM: CHEST - 2 VIEW COMPARISON:  None. FINDINGS: The cardiomediastinal contours are normal. The lungs are clear. Pulmonary vasculature is normal. No consolidation, pleural effusion, or pneumothorax. No acute osseous abnormalities are seen. IMPRESSION: No acute pulmonary process. Electronically Signed   By: Rubye Oaks M.D.   On: 08/14/2017 02:35    ____________________________________________   PROCEDURES  Procedure(s) performed: None  Procedures  Critical Care performed: No  ____________________________________________   INITIAL  IMPRESSION / ASSESSMENT AND PLAN / ED COURSE  As part of my medical decision making, I reviewed the following data within the electronic MEDICAL RECORD NUMBER Nursing notes reviewed and incorporated, Radiograph reviewed and Notes from prior ED visits   32 year old female who presents with cough secondary to bronchitis.  Chest x-ray is unremarkable.  Will treat with Medrol Dosepak, Tussionex, albuterol inhaler and patient will follow-up with her PCP next week.  Strict return precautions given.  Patient verbalizes understanding and agrees with plan of care.      ____________________________________________   FINAL CLINICAL IMPRESSION(S) / ED DIAGNOSES  Final diagnoses:  Cough  Acute bronchitis, unspecified organism     ED Discharge Orders        Ordered    albuterol (PROVENTIL HFA;VENTOLIN HFA) 108 (90 Base) MCG/ACT inhaler  Every 4 hours PRN     08/14/17 0511    methylPREDNISolone (MEDROL DOSEPAK) 4 MG TBPK tablet     08/14/17 0511    chlorpheniramine-HYDROcodone (TUSSIONEX PENNKINETIC ER) 10-8 MG/5ML SUER  2 times daily  08/14/17 0511       Note:  This document was prepared using Dragon voice recognition software and may include unintentional dictation errors.    Irean HongSung, Rip Hawes J, MD 08/14/17 [phone]

## 2017-08-14 NOTE — ED Triage Notes (Signed)
Pt in with co persistent cough and cold symptoms for 2 weeks. Pt has hx of bronchitis.

## 2017-08-14 NOTE — Discharge Instructions (Signed)
1.  Take steroid taper as prescribed (Medrol Dosepak). 2.  You may take Tussionex as needed for cough. 3.  Use albuterol inhaler 2 puffs every 4 hours as needed for cough/wheezing/difficulty breathing. 4.  Return to the ER for worsening symptoms, persistent vomiting, difficulty breathing or other concerns.

## 2017-08-18 ENCOUNTER — Ambulatory Visit: Payer: Medicaid Other

## 2017-09-01 ENCOUNTER — Ambulatory Visit: Payer: Medicaid Other | Admitting: Family Medicine

## 2017-09-01 VITALS — BP 133/85 | HR 91 | Wt 94.4 lb

## 2017-09-01 DIAGNOSIS — G4700 Insomnia, unspecified: Secondary | ICD-10-CM

## 2017-09-01 DIAGNOSIS — Z09 Encounter for follow-up examination after completed treatment for conditions other than malignant neoplasm: Secondary | ICD-10-CM

## 2017-09-01 DIAGNOSIS — M542 Cervicalgia: Secondary | ICD-10-CM

## 2017-09-01 DIAGNOSIS — J40 Bronchitis, not specified as acute or chronic: Secondary | ICD-10-CM

## 2017-09-01 MED ORDER — HYDROCOD POLST-CPM POLST ER 10-8 MG/5ML PO SUER: 5.0000 mL | Freq: Two times a day (BID) | ORAL | 0 refills | Status: DC

## 2017-09-01 MED ORDER — METAXALONE 800 MG PO TABS: 800.0000 mg | ORAL_TABLET | Freq: Three times a day (TID) | ORAL | 11 refills | Status: DC

## 2017-09-01 NOTE — Progress Notes (Signed)
Patient: Kristy Hansen Female    DOB: 01/06/1986   32 y.o.   MRN: [id number] Visit Date: 09/01/2017  Today's Provider: Kallie LocksNatalie M Elisia Stepp, FNP   Chief Complaint  Patient presents with  . Insomnia  . Cough  . Medication Refill   Subjective:   HPI  Kristy Hansen is here today to establish care with PCP. She does not have insurance and wants to minimize her ER visits. She states that she has had a bad cough since her last hospital visit on 08/14/2017. She was discharged with Albuterol inhaler, Tussinex, and Medrol Dosepak for cough and bronchitis. She is feeling much better today. She continues to have a congested cough. She denies chest pain, and shortness of breath. She denies fevers, chills, and unintentional weight loss. She denies pain.   She states that she needs refill on Skelatin 800 mg for neck and back pain and Tussinex.   Allergies  Allergen Reactions  . Penicillins Shortness Of Breath and Swelling  . Aspirin Hives  . Naprosyn [Naproxen] Hives  . Tramadol Hives   Previous Medications   METAXALONE (SKELAXIN) 800 MG TABLET    Take 1 tablet (800 mg total) by mouth 3 (three) times daily.    Review of Systems  Constitutional: Negative.   HENT: Negative.   Eyes: Negative.   Respiratory: Negative.   Cardiovascular: Negative.   Gastrointestinal: Negative.   Endocrine: Negative.   Genitourinary: Negative.   Musculoskeletal: Negative.   Skin: Negative.   Allergic/Immunologic: Negative.   Neurological: Negative.   Hematological: Negative.   Psychiatric/Behavioral: Negative.   All other systems reviewed and are negative.   Social History   Tobacco Use  . Smoking status: Current Some Day Smoker    Packs/day: 0.25    Types: Cigarettes  . Smokeless tobacco: Never Used  Substance Use Topics  . Alcohol use: Yes    Alcohol/week: 0.0 oz    Comment: occas   Objective:   BP 133/85   Pulse 91   Wt 94 lb 6.4 oz (42.8 kg)   LMP 08/28/2017   BMI 17.84 kg/m   Physical  Exam  Constitutional: She is oriented to person, place, and time. She appears well-nourished.  HENT:  Head: Normocephalic and atraumatic.  Right Ear: External ear normal.  Left Ear: External ear normal.  Mouth/Throat: Oropharynx is clear and moist.  Eyes: Pupils are equal, round, and reactive to light. Conjunctivae and EOM are normal.  Neck: Normal range of motion. Neck supple.  Cardiovascular: Normal rate, regular rhythm, normal heart sounds and intact distal pulses.  Pulmonary/Chest: Effort normal and breath sounds normal.  Abdominal: Soft. Bowel sounds are normal.  Musculoskeletal: Normal range of motion.  Neurological: She is alert and oriented to person, place, and time. She has normal reflexes.  Skin: Skin is warm and dry.  Psychiatric: She has a normal mood and affect. Her behavior is normal. Judgment and thought content normal.  Nursing note and vitals reviewed.      Assessment & Plan:  1. Hospital discharge follow-up She is doing good today. She has no signs and symptoms of respiratory distress. We will reorder Tussionex Elixir for cough today.   2. Neck pain Stable. Refill Skelaxin today.   3. Cough Improving. Refill Tussionex today.   4. Insomnia, unspecified type She will continue tactics for sleep. We will reassess at next office visit.   5. Follow up Follow up in 2 weeks for OV follow up assessment.     Rolm GalaNatalie M  Bradly Chris, FNP   [facility]

## 2017-09-01 NOTE — Progress Notes (Signed)
  Patient: Kristy Hansen Female    DOB: [birth year]   32 y.o.   MRN: [id number] Visit Date: 09/01/2017  Today's Provider: [facility]   No chief complaint on file.  Subjective:    Kristy Hansen is a 32 y/o here for       Allergies  Allergen Reactions  . Penicillins Shortness Of Breath and Swelling  . Aspirin Hives  . Naprosyn [Naproxen] Hives  . Tramadol Hives   Previous Medications   ACYCLOVIR (ZOVIRAX) 800 MG TABLET    Take 1 tablet (800 mg total) by mouth 2 (two) times daily.   ALBUTEROL (PROVENTIL HFA;VENTOLIN HFA) 108 (90 BASE) MCG/ACT INHALER    Inhale 2 puffs into the lungs every 4 (four) hours as needed for wheezing or shortness of breath.   CHLORPHENIRAMINE-HYDROCODONE (TUSSIONEX PENNKINETIC ER) 10-8 MG/5ML SUER    Take 5 mLs by mouth 2 (two) times daily.   LORATADINE (CLARITIN) 10 MG TABLET    Take 10 mg by mouth daily.   METAXALONE (SKELAXIN) 800 MG TABLET    Take 1 tablet (800 mg total) by mouth 3 (three) times daily.   METHYLPREDNISOLONE (MEDROL DOSEPAK) 4 MG TBPK TABLET    Take as directed    Review of Systems  Social History   Tobacco Use  . Smoking status: Current Some Day Smoker    Packs/day: 0.25    Types: Cigarettes  . Smokeless tobacco: Never Used  Substance Use Topics  . Alcohol use: Yes    Alcohol/week: 0.0 oz    Comment: occas   Objective:   LMP 08/06/2017   Physical Exam      Assessment & Plan:           [facility]   [facility] [facility]

## 2017-09-02 ENCOUNTER — Other Ambulatory Visit: Payer: Self-pay

## 2017-09-02 DIAGNOSIS — M549 Dorsalgia, unspecified: Secondary | ICD-10-CM

## 2017-09-02 LAB — COMPREHENSIVE METABOLIC PANEL
ALT: 8 IU/L (ref 0–32)
AST: 12 IU/L (ref 0–40)
Albumin/Globulin Ratio: 2.4 — ABNORMAL HIGH (ref 1.2–2.2)
Albumin: 4.7 g/dL (ref 3.5–5.5)
Alkaline Phosphatase: 55 IU/L (ref 39–117)
BUN/Creatinine Ratio: 11 (ref 9–23)
BUN: 7 mg/dL (ref 6–20)
Bilirubin Total: 0.3 mg/dL (ref 0.0–1.2)
CO2: 24 mmol/L (ref 20–29)
Calcium: 9.6 mg/dL (ref 8.7–10.2)
Chloride: 105 mmol/L (ref 96–106)
Creatinine, Ser: 0.65 mg/dL (ref 0.57–1.00)
GFR calc Af Amer: 139 mL/min/{1.73_m2} (ref >59)
GFR calc non Af Amer: 120 mL/min/{1.73_m2} (ref >59)
Globulin, Total: 2 g/dL (ref 1.5–4.5)
Glucose: 93 mg/dL (ref 65–99)
Potassium: 5 mmol/L (ref 3.5–5.2)
Sodium: 142 mmol/L (ref 134–144)
Total Protein: 6.7 g/dL (ref 6.0–8.5)

## 2017-09-02 LAB — CBC
Hematocrit: 43.2 % (ref 34.0–46.6)
Hemoglobin: 14.2 g/dL (ref 11.1–15.9)
MCH: 29.5 pg (ref 26.6–33.0)
MCHC: 32.9 g/dL (ref 31.5–35.7)
MCV: 90 fL (ref 79–97)
Platelets: 257 10*3/uL (ref 150–379)
RBC: 4.82 x10E6/uL (ref 3.77–5.28)
RDW: 12.8 % (ref 12.3–15.4)
WBC: 8.6 10*3/uL (ref 3.4–10.8)

## 2017-09-02 MED ORDER — METAXALONE 800 MG PO TABS: 800.0000 mg | ORAL_TABLET | Freq: Three times a day (TID) | ORAL | 11 refills | Status: DC

## 2017-09-15 ENCOUNTER — Ambulatory Visit: Payer: Self-pay | Admitting: Pharmacy Technician

## 2017-09-15 DIAGNOSIS — Z79899 Other long term (current) drug therapy: Secondary | ICD-10-CM

## 2017-09-15 NOTE — Progress Notes (Signed)
Completed [facility] application and contract.  Patient agreed to all terms of the [facility] contract.    Patient approved to receive medication assistance at [facility] through 2019, as long as eligibility criteria continues to be met.    Provided patient with community resource material based on her particular needs.    Izella Ybanez J. Hajime Asfaw Care Manager [facility]  

## 2017-09-24 ENCOUNTER — Telehealth: Payer: Self-pay

## 2017-09-24 NOTE — Telephone Encounter (Signed)
Patient presented to [facility] requesting an alternative to cough syrup with codeine as our providers are unable to write for it.  She states her cough has been persistent for the past 8 weeks after having bronchitis in February.  If something can be called in,  [facility] pharmacy [facility] choice is [facility].   Is there something she can take?   [facility], though it says on the box not to take if cough is persistent.

## 2017-09-26 ENCOUNTER — Other Ambulatory Visit: Payer: Self-pay | Admitting: Family Medicine

## 2017-09-26 MED ORDER — BENZONATATE 100 MG PO CAPS: 100.0000 mg | ORAL_CAPSULE | Freq: Three times a day (TID) | ORAL | 0 refills | Status: DC | PRN

## 2017-09-26 NOTE — Telephone Encounter (Signed)
Prescription for Tessalon Perles sent to [facility]

## 2017-09-30 ENCOUNTER — Encounter: Payer: Self-pay | Admitting: Emergency Medicine

## 2017-09-30 ENCOUNTER — Other Ambulatory Visit: Payer: Self-pay

## 2017-09-30 ENCOUNTER — Emergency Department: Payer: Self-pay

## 2017-09-30 ENCOUNTER — Emergency Department
Admission: EM | Admit: 2017-09-30 | Discharge: 2017-09-30 | Disposition: A | Discharge status: Home / Self Care | Payer: Self-pay | Attending: Emergency Medicine | Admitting: Emergency Medicine

## 2017-09-30 DIAGNOSIS — R05 Cough: Secondary | ICD-10-CM | POA: Insufficient documentation

## 2017-09-30 DIAGNOSIS — F17210 Nicotine dependence, cigarettes, uncomplicated: Secondary | ICD-10-CM | POA: Insufficient documentation

## 2017-09-30 DIAGNOSIS — R059 Cough, unspecified: Secondary | ICD-10-CM

## 2017-09-30 DIAGNOSIS — K219 Gastro-esophageal reflux disease without esophagitis: Secondary | ICD-10-CM | POA: Insufficient documentation

## 2017-09-30 MED ORDER — OMEPRAZOLE MAGNESIUM 20 MG PO TBEC: 20.0000 mg | DELAYED_RELEASE_TABLET | Freq: Every day | ORAL | 1 refills | Status: DC

## 2017-09-30 MED ORDER — BENZONATATE 100 MG PO CAPS: 100.0000 mg | ORAL_CAPSULE | Freq: Three times a day (TID) | ORAL | 0 refills | Status: DC | PRN

## 2017-09-30 NOTE — ED Notes (Signed)
ED Provider at bedside. 

## 2017-09-30 NOTE — ED Provider Notes (Signed)
[facility] Emergency Department Provider Note  ____________________________________________   First MD Initiated Contact with Patient 09/30/17 [phone]     (approximate)  I have reviewed the triage vital signs and the nursing notes.   HISTORY  Chief Complaint Cough    HPI Kristy Hansen is a 32 y.o. female with medical history as listed below who presents for evaluation of persistent cough over the last 2 months.  She was seen and treated for bronchitis about 2 months ago but her cough is persisted.  She says it is frequently productive.  It is usually worse at night and when she first wakes up in the morning or after lying flat.  She is not particularly short of breath, just tired of having the cough and she is worried that something is wrong.  She says that it makes her anxiety worse when she cannot stop coughing.  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  She smokes anywhere from a half a pack to 2 packs a day and has been smoking since she was 32 years old (17th, potentially as much is a 34-pack-year history).  She says that she knows she should quit but has not been able to due to her anxiety.  Nothing in particular makes the cough better and she describes it as severe.  She denies fever/chills, chest pain, shortness of breath, nausea, vomiting, abdominal pain.  She does say that she frequently feels like she has heartburn and has a burning sensation in her upper abdomen and sometimes into her throat, often at night.  Past Medical History:  Diagnosis Date  . Anxiety   . Autoimmune disease (HCC)   . Chiari malformation type I (HCC)   . Hematuria, gross   . Panic attack   . Pyelonephritis   . Renal cyst     Patient Active Problem List   Diagnosis Date Noted  . Galactorrhea 06/19/2016  . Irregular menses 06/19/2016  . Renal cyst 01/05/2015  . Paresthesias 12/12/2014  . Family history of ovarian cancer 12/12/2014    . Tobacco user 12/12/2014  . Renal mass 12/05/2014  . Anxiety 10/11/2014  . Back pain, chronic 07/17/2014    Past Surgical History:  Procedure Laterality Date  . CYST REMOVAL PEDIATRIC     Spine  . MYRINGOTOMY WITH TUBE PLACEMENT    . SPINE SURGERY      Prior to Admission medications   Medication Sig Start Date End Date Taking? Authorizing Provider  benzonatate (TESSALON PERLES) 100 MG capsule Take 1 capsule (100 mg total) by mouth 3 (three) times daily as needed for cough. 09/30/17   Loleta Rose, MD  chlorpheniramine-HYDROcodone (TUSSIONEX PENNKINETIC ER) 10-8 MG/5ML SUER Take 5 mLs by mouth 2 (two) times daily. 09/01/17   Kallie Locks, FNP  metaxalone (SKELAXIN) 800 MG tablet Take 1 tablet (800 mg total) by mouth 3 (three) times daily. 09/02/17 09/02/18  Kallie Locks, FNP  omeprazole (PRILOSEC OTC) 20 MG tablet Take 1 tablet (20 mg total) by mouth daily. 09/30/17 09/30/18  Loleta Rose, MD    Allergies Penicillins; Aspirin; Naprosyn [naproxen]; and Tramadol  Family History  Problem Relation Age of Onset  . Cancer Mother        breast cancer, ovarian cancer  . Diabetes Mother   . Hypertension Mother   . Diabetes Maternal Grandmother   . Diabetes Maternal Uncle   . Kidney disease Neg Hx   .  Bladder Cancer Neg Hx   . Prostate cancer Neg Hx     Social History Social History   Tobacco Use  . Smoking status: Current Some Day Smoker    Packs/day: 0.25    Types: Cigarettes  . Smokeless tobacco: Never Used  Substance Use Topics  . Alcohol use: Yes    Alcohol/week: 0.0 oz    Comment: occas  . Drug use: No    Review of Systems Constitutional: No fever/chills Eyes: No visual changes. ENT: No sore throat. Cardiovascular: Denies chest pain. Respiratory: Persistent cough for about 2 months.  Denies shortness of breath. Gastrointestinal: Acid reflux symptoms as described above but no lower abdominal pain, nausea, nor vomiting Genitourinary: Negative for  dysuria. Musculoskeletal: Negative for neck pain.  Negative for back pain. Integumentary: Negative for rash. Neurological: Negative for headaches, focal weakness or numbness.   ____________________________________________   PHYSICAL EXAM:  VITAL SIGNS: ED Triage Vitals [09/30/17 0030]  Enc Vitals Group     BP (!) 157/100     Pulse Rate 90     Resp 20     Temp 97.7 F (36.5 C)     Temp Source Oral     SpO2 97 %     Weight 42.6 kg (94 lb)     Height 1.549 m (5\' 1" )     Head Circumference      Peak Flow      Pain Score 0     Pain Loc      Pain Edu?      Excl. in GC?     Constitutional: Alert and oriented. Well appearing and in no acute distress. Eyes: Conjunctivae are normal.  Head: Atraumatic. Nose: No congestion/rhinnorhea. Mouth/Throat: Mucous membranes are moist. Neck: No stridor.  No meningeal signs.   Cardiovascular: Normal rate, regular rhythm. Good peripheral circulation. Grossly normal heart sounds. Respiratory: Normal respiratory effort.  No retractions. Lungs CTAB. Gastrointestinal: Soft and nontender. No distention.  Musculoskeletal: No lower extremity tenderness nor edema. No gross deformities of extremities. Neurologic:  Normal speech and language. No gross focal neurologic deficits are appreciated.  Skin:  Skin is warm, dry and intact. No rash noted. Psychiatric: Mood and affect are normal. Speech and behavior are normal.  ____________________________________________   LABS (all labs ordered are listed, but only abnormal results are displayed)  Labs Reviewed - No data to display ____________________________________________  EKG  None - EKG not ordered by ED physician ____________________________________________  RADIOLOGY I, Loleta Roseory Clay Solum, personally viewed and evaluated these images (plain radiographs) as part of my medical decision making, as well as reviewing the written report by the radiologist.  ED MD interpretation: No acute findings on  chest x-ray such as pneumonia  Official radiology report(s): Dg Chest 2 View  Result Date: 09/30/2017 CLINICAL DATA:  Productive cough EXAM: CHEST - 2 VIEW COMPARISON:  08/14/2017 FINDINGS: The heart size and mediastinal contours are within normal limits and stable. Both lungs are clear. The visualized skeletal structures are unremarkable. IMPRESSION: No active cardiopulmonary disease. Electronically Signed   By: Tollie Ethavid  Kwon M.D.   On: 09/30/2017 00:57    ____________________________________________   PROCEDURES  Critical Care performed: No   Procedure(s) performed:   Procedures   ____________________________________________   INITIAL IMPRESSION / ASSESSMENT AND PLAN / ED COURSE  As part of my medical decision making, I reviewed the following data within the electronic MEDICAL RECORD NUMBER Nursing notes reviewed and incorporated, Old chart reviewed, Radiograph reviewed  and Notes from prior ED  visits    Differential includes community acquired pneumonia, chronic bronchitis/COPD, asthma, cough due to acid reflux or medication side effect.  Patient has no evidence of pneumonia and although she does have a significant pack-year history of smoking, she is only 51 with clear lungs and I think that COPD is unlikely.  She still likely has some element of chronic bronchitis but I think that her acid reflux symptoms may suggest the most possible diagnosis given the persistence of the symptoms.  We had a long talk about smoking cessation but I also recommended that she start taking a PPI and try sleeping on a couple of pillows to keep her head elevated to try to prevent acid reflux from irritating her airway.  I encouraged her to also establish her primary care doctor so she could discuss her symptoms and management in greater detail but I suspect this would be helpful for her.  I also provided a prescription for Tessalon Perles and I gave my usual and customary return precautions.  She understands  and agrees with the plan.     ____________________________________________  FINAL CLINICAL IMPRESSION(S) / ED DIAGNOSES  Final diagnoses:  Cough  Gastroesophageal reflux disease, esophagitis presence not specified     MEDICATIONS GIVEN DURING THIS VISIT:  Medications - No data to display   ED Discharge Orders        Ordered    benzonatate (TESSALON PERLES) 100 MG capsule  3 times daily PRN     09/30/17 0652    omeprazole (PRILOSEC OTC) 20 MG tablet  Daily     09/30/17 1610       Note:  This document was prepared using Dragon voice recognition software and may include unintentional dictation errors.    Loleta Rose, MD 09/30/17 1004

## 2017-09-30 NOTE — Discharge Instructions (Signed)
As we discussed, your chest x-ray was clear and your lung sounds are clear.  You may still have a persistent cough due to chronic bronchitis, but it is also possible that your acid reflux symptoms are causing acid to get into your airway at night and lead to chronic irritation and cough.  We recommend that you try the things we discussed: Try elevating yourself on a couple of pillows at night so that your head is higher than your feet, which will prevent acid from coming up into your throat.  We recommend that you try taking the prescribed (but which is also available over-the-counter) acid reflux medication called omeprazole (Prilosec) or something similar.  Give it at least a couple of weeks to see if it is going to help you.  Additionally can continue trying to take over-the-counter cough medicine and use the [facility] as prescribed for symptomatic cough relief as well.  Return to the emergency department if you develop new or worsening symptoms that concern you.

## 2017-09-30 NOTE — ED Triage Notes (Addendum)
Patient ambulatory to triage with steady gait, without difficulty or distress noted; st prod cough green sputum since February that has persisted despite taking steroids for bronchitis; +smoker

## 2017-10-06 ENCOUNTER — Ambulatory Visit: Payer: Medicaid Other

## 2017-10-13 ENCOUNTER — Ambulatory Visit: Payer: Medicaid Other | Admitting: Family Medicine

## 2017-10-13 VITALS — BP 123/79 | HR 79 | Temp 98.0°F | Wt 97.0 lb

## 2017-10-13 DIAGNOSIS — Z09 Encounter for follow-up examination after completed treatment for conditions other than malignant neoplasm: Secondary | ICD-10-CM

## 2017-10-13 DIAGNOSIS — G4700 Insomnia, unspecified: Secondary | ICD-10-CM

## 2017-10-13 DIAGNOSIS — F419 Anxiety disorder, unspecified: Secondary | ICD-10-CM

## 2017-10-13 DIAGNOSIS — R52 Pain, unspecified: Secondary | ICD-10-CM

## 2017-10-13 NOTE — Progress Notes (Addendum)
Patient: Kristy Hansen Female    DOB: November 20, 1985   32 y.o.   MRN: [id number] Visit Date: 10/13/2017  Today's Provider: Kallie Locks, FNP   Chief Complaint  Patient presents with  . Follow-up    Trouble sleeping   Subjective:   HPI   She is here today for follow up. She denies fevers, chills, night sweats, an intentional weight. Loss.   States that she is not getting sleep. She cannot take most medications because of interactions with Skelaxin. States that she was previously Lexopro and it was effective. She denies headaches, dizziness, and falls.    States that she has seasonal allergies, which she takes Claritin. She has cough and congestion. She has not taken [employer] because of fear of adverse side effect. She denies shortness of breath, and chest pain.   She has occasional headaches. She denies pain at this time.   Appetite is good. No abdomina pain, nausea, and vomiting.   She continues to have cough and congestion. States that she is usually anxious and has not taken [employer] as of yet.   States that she does have chronic back pain, which she takes Skelaxin.   Allergies  Allergen Reactions  . Penicillins Shortness Of Breath and Swelling  . Aspirin Hives  . Naprosyn [Naproxen] Hives  . Tramadol Hives   Previous Medications   BENZONATATE (TESSALON PERLES) 100 MG CAPSULE    Take 1 capsule (100 mg total) by mouth 3 (three) times daily as needed for cough.   CHLORPHENIRAMINE-HYDROCODONE (TUSSIONEX PENNKINETIC ER) 10-8 MG/5ML SUER    Take 5 mLs by mouth 2 (two) times daily.   METAXALONE (SKELAXIN) 800 MG TABLET    Take 1 tablet (800 mg total) by mouth 3 (three) times daily.   OMEPRAZOLE (PRILOSEC OTC) 20 MG TABLET    Take 1 tablet (20 mg total) by mouth daily.    Review of Systems  Constitutional: Negative.   HENT: Negative.   Eyes: Negative.   Respiratory: Negative.   Cardiovascular: Negative.   Gastrointestinal: Negative.   Endocrine: Negative.    Genitourinary: Negative.   Musculoskeletal: Positive for joint swelling.  Skin: Negative.   Hematological: Negative.   Psychiatric/Behavioral:       Anxious  All other systems reviewed and are negative.   Social History   Tobacco Use  . Smoking status: Current Some Day Smoker    Packs/day: 0.50    Years: 15.00    Pack years: 7.50    Types: Cigarettes  . Smokeless tobacco: Never Used  Substance Use Topics  . Alcohol use: Yes    Alcohol/week: 0.0 oz    Comment: occas   Objective:   BP 123/79   Pulse 79   Temp 98 F (36.7 C)   Wt 97 lb (44 kg)   LMP 09/30/2017 (Exact Date)   BMI 18.33 kg/m   Physical Exam  Constitutional: She is oriented to person, place, and time. She appears well-developed and well-nourished.  Eyes: Conjunctivae and EOM are normal.  Neck: Normal range of motion. Neck supple.  Cardiovascular: Normal rate, regular rhythm, normal heart sounds and intact distal pulses.  Pulmonary/Chest: Effort normal and breath sounds normal.  Cough, congestion  Abdominal: Soft. Bowel sounds are normal.  Musculoskeletal: Normal range of motion.  Neurological: She is alert and oriented to person, place, and time.  Skin: Skin is warm and dry. Capillary refill takes less than 2 seconds.  Nursing note and vitals reviewed.   Assessment  1. Insomnia, unspecified type There are many types of sleep medications that she cannot take while on Skelatin. We will refer her to meet with   2. Pain She had chronic neck pain. She will continue to takes OTC pain meds as needed.   3. Anxiety Follow up with Herbert Seta social worker asap for referral, who will possibly refer her to Mental Health Counselor who will assess the need to write for anti-anxiety medications.   4. Follow up Follow up in 3 months ...repeat labs and Ov.   Kallie Locks, FNP   [facility]

## 2017-10-20 ENCOUNTER — Encounter: Payer: Self-pay | Admitting: Licensed Clinical Social Worker

## 2017-10-20 ENCOUNTER — Ambulatory Visit: Payer: Medicaid Other | Admitting: Licensed Clinical Social Worker

## 2017-10-20 DIAGNOSIS — G935 Compression of brain: Secondary | ICD-10-CM | POA: Insufficient documentation

## 2017-10-20 DIAGNOSIS — F411 Generalized anxiety disorder: Secondary | ICD-10-CM
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
## 2017-11-13 ENCOUNTER — Telehealth: Payer: Self-pay | Admitting: Pharmacist

## 2017-11-13 NOTE — Telephone Encounter (Signed)
11/13/2017 10:11:22 AM - Skelaxin new med  11/13/17 Received pharmacy printout for Skelaxin 800mg  Take one tablet by mouth 3 times a day, printed Pfizer application-[facility] & mailing patient her portion to sign & return, also patient still needs to provide [employer] statement, marked on request to patient.Forde RadonAJ

## 2017-11-14 NOTE — ED Triage Notes (Signed)
C/O left wrist pain after falling at the skating rink.  + left radial pulse palpable.  Brisk capillary refill.  NAD.

## 2018-01-14 ENCOUNTER — Other Ambulatory Visit: Payer: Medicaid Other

## 2018-01-14 DIAGNOSIS — Z0000 Encounter for general adult medical examination without abnormal findings: Secondary | ICD-10-CM

## 2018-01-15 LAB — COMPREHENSIVE METABOLIC PANEL
ALBUMIN: 4.9 g/dL (ref 3.5–5.5)
ALK PHOS: 55 IU/L (ref 39–117)
ALT: 11 IU/L (ref 0–32)
AST: 17 IU/L (ref 0–40)
Albumin/Globulin Ratio: 2.3 — ABNORMAL HIGH (ref 1.2–2.2)
BUN / CREAT RATIO: 17 (ref 9–23)
BUN: 11 mg/dL (ref 6–20)
Bilirubin Total: 0.3 mg/dL (ref 0.0–1.2)
CO2: 22 mmol/L (ref 20–29)
CREATININE: 0.63 mg/dL (ref 0.57–1.00)
Calcium: 9.6 mg/dL (ref 8.7–10.2)
Chloride: 102 mmol/L (ref 96–106)
GFR calc Af Amer: 139 mL/min/{1.73_m2} (ref >59)
GFR calc non Af Amer: 121 mL/min/{1.73_m2} (ref >59)
GLUCOSE: 111 mg/dL — AB (ref 65–99)
Globulin, Total: 2.1 g/dL (ref 1.5–4.5)
Potassium: 4.4 mmol/L (ref 3.5–5.2)
Sodium: 140 mmol/L (ref 134–144)
Total Protein: 7 g/dL (ref 6.0–8.5)

## 2018-01-15 LAB — CBC
HEMATOCRIT: 39.9 % (ref 34.0–46.6)
HEMOGLOBIN: 13.5 g/dL (ref 11.1–15.9)
MCH: 29.9 pg (ref 26.6–33.0)
MCHC: 33.8 g/dL (ref 31.5–35.7)
MCV: 88 fL (ref 79–97)
Platelets: 281 10*3/uL (ref 150–450)
RBC: 4.52 x10E6/uL (ref 3.77–5.28)
RDW: 12.9 % (ref 12.3–15.4)
WBC: 7.4 10*3/uL (ref 3.4–10.8)

## 2018-01-19 ENCOUNTER — Ambulatory Visit: Payer: Medicaid Other | Admitting: Family Medicine

## 2018-01-19 ENCOUNTER — Other Ambulatory Visit: Payer: Self-pay

## 2018-01-19 VITALS — BP 136/90 | HR 77 | Temp 98.1°F | Ht 61.0 in | Wt 92.8 lb

## 2018-01-19 DIAGNOSIS — J302 Other seasonal allergic rhinitis: Secondary | ICD-10-CM

## 2018-01-19 DIAGNOSIS — R05 Cough: Secondary | ICD-10-CM

## 2018-01-19 DIAGNOSIS — M549 Dorsalgia, unspecified: Secondary | ICD-10-CM

## 2018-01-19 DIAGNOSIS — R0602 Shortness of breath: Secondary | ICD-10-CM

## 2018-01-19 DIAGNOSIS — R059 Cough, unspecified: Secondary | ICD-10-CM

## 2018-01-19 DIAGNOSIS — Z09 Encounter for follow-up examination after completed treatment for conditions other than malignant neoplasm: Secondary | ICD-10-CM

## 2018-01-19 DIAGNOSIS — Z716 Tobacco abuse counseling: Secondary | ICD-10-CM

## 2018-01-19 MED ORDER — VARENICLINE TARTRATE 1 MG PO TABS: 1.0000 mg | ORAL_TABLET | Freq: Two times a day (BID) | ORAL | 1 refills | Status: DC

## 2018-01-19 MED ORDER — HYDROCOD POLST-CPM POLST ER 10-8 MG/5ML PO SUER: 5.0000 mL | Freq: Two times a day (BID) | ORAL | 0 refills | Status: DC

## 2018-01-19 MED ORDER — METAXALONE 800 MG PO TABS: 800.0000 mg | ORAL_TABLET | Freq: Three times a day (TID) | ORAL | 11 refills | Status: DC

## 2018-01-19 MED ORDER — ALBUTEROL SULFATE HFA 108 (90 BASE) MCG/ACT IN AERS: 1.0000 | INHALATION_SPRAY | Freq: Four times a day (QID) | RESPIRATORY_TRACT | 11 refills | Status: DC | PRN

## 2018-01-19 MED ORDER — LORATADINE 10 MG PO TABS: 10.0000 mg | ORAL_TABLET | Freq: Every day | ORAL | 11 refills | Status: DC

## 2018-01-19 NOTE — Progress Notes (Signed)
Subjective:    Patient ID: Kristy Hansen, female    DOB: 1985-09-02, 32 y.o.   MRN: [id number]  HPI  Kristy Hansen is a 32 yo F here for 3 mo f/u for insomnia, chronic neck pain, and anxiety. Presents w/ c/o cough, headache, and fatigue.  Anxiety: she [facility]  Cough: pt has chronic cough that is productive right now. She reports her chest hurts w/ coughing. She states Tessalon Perles have no effect and Robitussin puts her to sleep. She reports some SOB.  Headache: she states that her headaches have increased.  Fatigue: She reports she thinks fatigue is from chronic cough.  Smoking cessation: pt would like to quit smoking and is interested in Chantix but is concerned about night terror side effect.   Patient Active Problem List   Diagnosis Date Noted  . Chiari malformation type I (HCC) 10/20/2017  . Galactorrhea 06/19/2016  . Irregular menses 06/19/2016  . Renal cyst 01/05/2015  . Paresthesias 12/12/2014  . Family history of ovarian cancer 12/12/2014  . Tobacco user 12/12/2014  . Renal mass 12/05/2014  . Anxiety 10/11/2014  . Back pain, chronic 07/17/2014   Allergies as of 01/19/2018      Reactions   Penicillins Shortness Of Breath, Swelling   Aspirin Hives   Naprosyn [naproxen] Hives   Tramadol Hives      Medication List        Accurate as of 01/19/18  6:54 PM. Always use your most recent med list.          benzonatate 100 MG capsule Commonly known as:  TESSALON Take 1 capsule (100 mg total) by mouth 3 (three) times daily as needed for cough.   chlorpheniramine-HYDROcodone 10-8 MG/5ML Suer Commonly known as:  TUSSIONEX Take 5 mLs by mouth 2 (two) times daily.   metaxalone 800 MG tablet Commonly known as:  SKELAXIN Take 1 tablet (800 mg total) by mouth 3 (three) times daily.   omeprazole 20 MG tablet Commonly known as:  PRILOSEC OTC Take 1 tablet (20 mg total) by mouth daily.        Review of Systems  All  other systems reviewed and are negative.      Objective:   Physical Exam  Constitutional: She is oriented to person, place, and time. She appears well-developed and well-nourished.  Cardiovascular: Normal rate, regular rhythm and normal heart sounds.  Pulmonary/Chest: Effort normal. She has wheezes in the right middle field and the right lower field.  Abdominal: Soft. Bowel sounds are normal.  Neurological: She is alert and oriented to person, place, and time.  Skin: Skin is warm and dry.  Psychiatric: She has a normal mood and affect. Her behavior is normal. Judgment and thought content normal.  Vitals reviewed.   BP 136/90   Pulse 77   Temp 98.1 F (36.7 C)   Ht 5\' 1"  (1.549 m)   Wt 92 lb 12.8 oz (42.1 kg)   BMI 17.53 kg/m   Assessment & Plan:   Chronic Cough: Likely due to seasonal allergies due to timing of symptoms in early spring and early fall.  Rx Claritin daily for seasonal allergies.  Rx Tussionex 5mL BID for chronic cough.  Rx Albuterol for SOB.   Smoking Cessation: Rx Chantix, 60 tablets.  F/u in 1 mo to evaluate med effectiveness for coughing and smoking cessation.   Meds ordered this encounter  Medications  . loratadine (CLARITIN) 10 MG tablet  Sig: Take 1 tablet (10 mg total) by mouth daily.    Dispense:  30 tablet    Refill:  11  . chlorpheniramine-HYDROcodone (TUSSIONEX PENNKINETIC ER) 10-8 MG/5ML SUER    Sig: Take 5 mLs by mouth 2 (two) times daily.    Dispense:  70 mL    Refill:  0  . varenicline (CHANTIX) 1 MG tablet    Sig: Take 1 tablet (1 mg total) by mouth 2 (two) times daily.    Dispense:  60 tablet    Refill:  1  . albuterol (PROVENTIL HFA;VENTOLIN HFA) 108 (90 Base) MCG/ACT inhaler    Sig: Inhale 1-2 puffs into the lungs every 6 (six) hours as needed for wheezing or shortness of breath.    Dispense:  1 Inhaler    Refill:  11    Raliegh IpNatalie Stroud,  MSN, [facility] ServicesFNP-C [facility] [place] [address]  [place]y [place], [place] [id number] [phone]

## 2018-02-10 ENCOUNTER — Telehealth: Payer: Self-pay | Admitting: Pharmacist

## 2018-02-10 NOTE — Telephone Encounter (Signed)
02/10/2018 2:13:57 PM - Skelaxin  02/10/18 Faxing Pf[employer] application for Skelaxin 800mg  take 1 tablet three times a day.Forde Radon

## 2018-02-12 ENCOUNTER — Emergency Department
Admission: EM | Admit: 2018-02-12 | Discharge: 2018-02-12 | Disposition: A | Discharge status: Home / Self Care | Payer: Medicaid Other | Attending: Emergency Medicine | Admitting: Emergency Medicine

## 2018-02-12 ENCOUNTER — Other Ambulatory Visit: Payer: Self-pay

## 2018-02-12 DIAGNOSIS — R51 Headache: Secondary | ICD-10-CM | POA: Insufficient documentation

## 2018-02-12 DIAGNOSIS — F17210 Nicotine dependence, cigarettes, uncomplicated: Secondary | ICD-10-CM | POA: Insufficient documentation

## 2018-02-12 DIAGNOSIS — R519 Headache, unspecified: Secondary | ICD-10-CM

## 2018-02-12 DIAGNOSIS — Z79899 Other long term (current) drug therapy: Secondary | ICD-10-CM | POA: Insufficient documentation

## 2018-02-12 DIAGNOSIS — R0789 Other chest pain: Secondary | ICD-10-CM

## 2018-02-12 LAB — CBC
HEMATOCRIT: 38.1 % (ref 35.0–47.0)
Hemoglobin: 13 g/dL (ref 12.0–16.0)
MCH: 30.4 pg (ref 26.0–34.0)
MCHC: 34 g/dL (ref 32.0–36.0)
MCV: 89.4 fL (ref 80.0–100.0)
PLATELETS: 214 10*3/uL (ref 150–440)
RBC: 4.26 MIL/uL (ref 3.80–5.20)
RDW: 13.4 % (ref 11.5–14.5)
WBC: 7.4 10*3/uL (ref 3.6–11.0)

## 2018-02-12 LAB — COMPREHENSIVE METABOLIC PANEL
ALBUMIN: 4.1 g/dL (ref 3.5–5.0)
ALT: 12 U/L (ref 0–44)
AST: 19 U/L (ref 15–41)
Alkaline Phosphatase: 43 U/L (ref 38–126)
Anion gap: 8 (ref 5–15)
BUN: 13 mg/dL (ref 6–20)
CHLORIDE: 107 mmol/L (ref 98–111)
CO2: 26 mmol/L (ref 22–32)
Calcium: 8.7 mg/dL — ABNORMAL LOW (ref 8.9–10.3)
Creatinine, Ser: 0.63 mg/dL (ref 0.44–1.00)
GFR calc Af Amer: >60 mL/min (ref >60)
GFR calc non Af Amer: >60 mL/min (ref >60)
GLUCOSE: 84 mg/dL (ref 70–99)
POTASSIUM: 4.1 mmol/L (ref 3.5–5.1)
Sodium: 141 mmol/L (ref 135–145)
Total Bilirubin: 0.7 mg/dL (ref 0.3–1.2)
Total Protein: 6.4 g/dL — ABNORMAL LOW (ref 6.5–8.1)

## 2018-02-12 LAB — POCT PREGNANCY, URINE: Preg Test, Ur: NEGATIVE

## 2018-02-12 LAB — TROPONIN I: Troponin I: <0.03 ng/mL (ref <0.03)

## 2018-02-12 MED ORDER — ACETAMINOPHEN 325 MG PO TABS
650.0000 mg | ORAL_TABLET | Freq: Once | ORAL | Status: AC
  Administered 2018-02-12: 650 mg via ORAL
  Filled 2018-02-12: qty 2

## 2018-02-12 MED ORDER — GI COCKTAIL ~~LOC~~
30.0000 mL | Freq: Once | ORAL | Status: AC
  Administered 2018-02-12: 30 mL via ORAL
  Filled 2018-02-12: qty 30

## 2018-02-12 MED ORDER — SODIUM CHLORIDE 0.9 % IV SOLN
1000.0000 mL | Freq: Once | INTRAVENOUS | Status: AC
  Administered 2018-02-12: 1000 mL via INTRAVENOUS

## 2018-02-12 NOTE — ED Notes (Signed)
Pt resting. Meds, warm blanket, and lights off.

## 2018-02-12 NOTE — ED Triage Notes (Signed)
Pt c/o sudden onset chest pain with dizziness and HA while at work this morning. Denies cheat pain at present. But states the dizziness and HA are still there but not as intense.

## 2018-02-12 NOTE — ED Provider Notes (Signed)
[facility] Emergency Department Provider Note   ____________________________________________    I have reviewed the triage vital signs and the nursing notes.   HISTORY  Chief Complaint Chest Pain     HPI Kristy Hansen is a 32 y.o. female presents with complaints of dizziness and headache and a brief episode of chest pain.  Patient reports that she had been at work for about 2 hours today and developed some mild dizziness.  She reports this is common for her given her Chiari malformation and typically when this happens she rests and "back down ".  However approximately 1 hour later she developed a throbbing headache and then 15 minutes later developed a brief episode of sharp chest pain, now relieved.  No shortness of breath no pleurisy.  No new neuro deficits.  Has chronic paresthesias in the hands and feet.  Does not have routine follow-up with neurology anymore.  No fevers or chills.  Has chronic neck pain as well.   Past Medical History:  Diagnosis Date  . Anxiety   . Autoimmune disease (HCC)   . Chiari malformation type I (HCC)   . Hematuria, gross   . Panic attack   . Pyelonephritis   . Renal cyst     Patient Active Problem List   Diagnosis Date Noted  . Chiari malformation type I (HCC) 10/20/2017  . Galactorrhea 06/19/2016  . Irregular menses 06/19/2016  . Renal cyst 01/05/2015  . Paresthesias 12/12/2014  . Family history of ovarian cancer 12/12/2014  . Tobacco user 12/12/2014  . Renal mass 12/05/2014  . Anxiety 10/11/2014  . Back pain, chronic 07/17/2014    Past Surgical History:  Procedure Laterality Date  . CYST REMOVAL PEDIATRIC     Spine  . MYRINGOTOMY WITH TUBE PLACEMENT    . SPINE SURGERY      Prior to Admission medications   Medication Sig Start Date End Date Taking? Authorizing Provider  albuterol (PROVENTIL HFA;VENTOLIN HFA) 108 (90 Base) MCG/ACT inhaler Inhale 1-2 puffs into the lungs every 6 (six) hours as  needed for wheezing or shortness of breath. 01/19/18  Yes Kallie Locks, FNP  loratadine (CLARITIN) 10 MG tablet Take 1 tablet (10 mg total) by mouth daily. 01/19/18  Yes Kallie Locks, FNP  metaxalone (SKELAXIN) 800 MG tablet Take 1 tablet (800 mg total) by mouth 3 (three) times daily. Patient taking differently: Take 800 mg by mouth 2 (two) times daily as needed (Pain).  01/19/18 01/19/19 Yes Kallie Locks, FNP  benzonatate (TESSALON PERLES) 100 MG capsule Take 1 capsule (100 mg total) by mouth 3 (three) times daily as needed for cough. Patient not taking: Reported on 01/19/2018 09/30/17   Loleta Rose, MD  chlorpheniramine-HYDROcodone [facility]) 10-8 MG/5ML SUER Take 5 mLs by mouth 2 (two) times daily. Patient not taking: Reported on 02/12/2018 01/19/18   Kallie Locks, FNP  omeprazole (PRILOSEC OTC) 20 MG tablet Take 1 tablet (20 mg total) by mouth daily. Patient not taking: Reported on 01/19/2018 09/30/17 09/30/18  Loleta Rose, MD  varenicline (CHANTIX) 1 MG tablet Take 1 tablet (1 mg total) by mouth 2 (two) times daily. Patient not taking: Reported on 02/12/2018 01/19/18   Kallie Locks, FNP     Allergies Penicillins; Aspirin; Naprosyn [naproxen]; and Tramadol  Family History  Problem Relation Age of Onset  . Cancer Mother        breast cancer, ovarian cancer  . Diabetes Mother   .  Hypertension Mother   . Diabetes Maternal Grandmother   . Diabetes Maternal Uncle   . Kidney disease Neg Hx   . Bladder Cancer Neg Hx   . Prostate cancer Neg Hx     Social History Social History   Tobacco Use  . Smoking status: Current Some Day Smoker    Packs/day: 0.50    Years: 15.00    Pack years: 7.50    Types: Cigarettes  . Smokeless tobacco: Never Used  Substance Use Topics  . Alcohol use: Yes    Alcohol/week: 0.0 standard drinks    Comment: occas  . Drug use: No    Review of Systems  Constitutional: No fever/chills Eyes: No visual changes.  ENT: No  sore throat. Cardiovascular: As above Respiratory: Denies shortness of breath. Gastrointestinal: No abdominal pain.  No nausea, no vomiting.   Genitourinary: Negative for dysuria. Musculoskeletal: Negative for back pain. Skin: Negative for rash. Neurological: As above   ____________________________________________   PHYSICAL EXAM:  VITAL SIGNS: ED Triage Vitals [02/12/18 0857]  Enc Vitals Group     BP (!) 145/93     Pulse Rate 78     Resp 15     Temp 97.7 F (36.5 C)     Temp Source Oral     SpO2 100 %     Weight 41.7 kg (92 lb)     Height 1.549 m (5\' 1" )     Head Circumference      Peak Flow      Pain Score 3     Pain Loc      Pain Edu?      Excl. in GC?     Constitutional: Alert and oriented. No acute distress.  Eyes: Conjunctivae are normal.  EOMI Head: Atraumatic. Nose: No congestion/rhinnorhea. Mouth/Throat: Mucous membranes are moist.    Cardiovascular: Normal rate, regular rhythm.  Normal heart sounds good peripheral circulation.  No chest wall tenderness Respiratory: Normal respiratory effort.  No retractions.  Gastrointestinal: Soft and nontender. No distention.   Genitourinary: deferred Musculoskeletal: No lower extremity tenderness nor edema.  Warm and well perfused Neurologic:  Normal speech and language. No gross focal neurologic deficits are appreciated.  Skin:  Skin is warm, dry and intact. No rash noted. Psychiatric: Mood and affect are normal. Speech and behavior are normal.  ____________________________________________   LABS (all labs ordered are listed, but only abnormal results are displayed)  Labs Reviewed  COMPREHENSIVE METABOLIC PANEL - Abnormal; Notable for the following components:      Result Value   Calcium 8.7 (*)    Total Protein 6.4 (*)    All other components within normal limits  CBC  TROPONIN I  POC URINE PREG, ED  POCT PREGNANCY, URINE   ____________________________________________  EKG  ED ECG REPORT I, Jene Every, the attending physician, personally viewed and interpreted this ECG.  Date: 02/12/2018  Rhythm: normal sinus rhythm QRS Axis: normal Intervals: normal ST/T Wave abnormalities: normal Narrative Interpretation: no evidence of acute ischemia  ____________________________________________  RADIOLOGY  None ____________________________________________   PROCEDURES  Procedure(s) performed: No  Procedures   Critical Care performed: No ____________________________________________   INITIAL IMPRESSION / ASSESSMENT AND PLAN / ED COURSE  Pertinent labs & imaging results that were available during my care of the patient were reviewed by me and considered in my medical decision making (see chart for details).  Patient presents with complaints of dizziness, headache and brief episode of chest pain.  It appears that the  majority of her symptoms have improved at this time.  She still complains of a mild headache.  No further chest pain.  As stated above, she frequently gets dizziness from her Chiari malformation however today the headache and chest discomfort were somewhat new.  Currently feeling much better.  Will treat with Tylenol, IV fluids, check cardiac labs and reevaluate  ----------------------------------------- 12:06 PM on 02/12/2018 -----------------------------------------  Lab work is much better, patient reports headache has completely resolved, no dizziness at this time.  Does have occasional intermittent burning sensation primarily in her epigastrium, will trial GI cocktail however she is becoming anxious to leave  ----------------------------------------- 12:56 PM on 02/12/2018 -----------------------------------------  No significant improvement from GI cocktail however the patient refuses to stay any longer for further diagnostics.  She wants to leave.  She accepts that we do not have a clear diagnosis for her symptoms and reports that she feels 99% better and  just wants to go    ____________________________________________   FINAL CLINICAL IMPRESSION(S) / ED DIAGNOSES  Final diagnoses:  Atypical chest pain  Acute nonintractable headache, unspecified headache type        Note:  This document was prepared using Dragon voice recognition software and may include unintentional dictation errors.    Jene Every, MD 02/12/18 1256

## 2018-02-18 ENCOUNTER — Ambulatory Visit: Payer: Medicaid Other | Admitting: Obstetrics and Gynecology

## 2018-02-18 ENCOUNTER — Encounter: Payer: Self-pay | Admitting: Obstetrics and Gynecology

## 2018-02-18 VITALS — BP 131/86 | HR 71 | Temp 98.2°F | Ht 61.0 in | Wt 92.7 lb

## 2018-02-18 DIAGNOSIS — R05 Cough: Secondary | ICD-10-CM

## 2018-02-18 DIAGNOSIS — R0982 Postnasal drip: Secondary | ICD-10-CM

## 2018-02-18 DIAGNOSIS — R059 Cough, unspecified: Secondary | ICD-10-CM

## 2018-02-18 DIAGNOSIS — Z716 Tobacco abuse counseling: Secondary | ICD-10-CM

## 2018-02-18 MED ORDER — VARENICLINE TARTRATE 1 MG PO TABS
1.0000 mg | ORAL_TABLET | Freq: Two times a day (BID) | ORAL | 1 refills | Status: DC
Start: 2018-02-18 — End: 2019-07-06

## 2018-02-18 MED ORDER — ALBUTEROL SULFATE HFA 108 (90 BASE) MCG/ACT IN AERS: 1.0000 | INHALATION_SPRAY | Freq: Four times a day (QID) | RESPIRATORY_TRACT | 11 refills | Status: DC | PRN

## 2018-02-18 MED ORDER — FLUTICASONE PROPIONATE 50 MCG/ACT NA SUSP: 1.0000 | Freq: Two times a day (BID) | NASAL | 1 refills | Status: DC

## 2018-02-18 NOTE — Patient Instructions (Signed)
I value your feedback and entrusting us with your care. If you get a [facility] patient survey, I would appreciate you taking the time to let us know about your experience today. Thank you! 

## 2018-02-18 NOTE — Progress Notes (Signed)
System, Pcp Not In   Chief Complaint  Patient presents with  . Nicotine Dependence    Requesting chantix    HPI:      Kristy Hansen is a 32 y.o. [id number] who LMP was Patient's last menstrual period was 02/06/2018 (exact date)., presents today for f/u on cough. Pt with persistent prod cough, worse at night, for several months. Had bronchitis 2/19 and was treated with abx. Neg CXR 4/19. Cough has since persisted. Treated with tussionex last month with sx relief. Is taking claritin that has helped. But, pt has sore throat due to chronic cough. Did an OTC nasal spray, although unsure what kind. Not taking decongestant. Does have nasal congestion, no rhinorrhea. No fevers. Pt with seasonal allergies. Has used albuterol inhaler occasionally because feels like she is choking and then can't breathe. No hx of asthma. Is a smoker, wants to quit with chantix. States pharm never got Rx last time.     Past Medical History:  Diagnosis Date  . Anxiety   . Autoimmune disease (HCC)   . Chiari malformation type I (HCC)   . Hematuria, gross   . Panic attack   . Pyelonephritis   . Renal cyst     Past Surgical History:  Procedure Laterality Date  . CYST REMOVAL PEDIATRIC     Spine  . MYRINGOTOMY WITH TUBE PLACEMENT    . SPINE SURGERY      Family History  Problem Relation Age of Onset  . Cancer Mother        breast cancer, ovarian cancer  . Diabetes Mother   . Hypertension Mother   . Diabetes Maternal Grandmother   . Diabetes Maternal Uncle   . Kidney disease Neg Hx   . Bladder Cancer Neg Hx   . Prostate cancer Neg Hx     Social History   Socioeconomic History  . Marital status: Legally Separated    Spouse name: Not on file  . Number of children: Not on file  . Years of education: Not on file  . Highest education level: Not on file  Occupational History  . Not on file  Social Needs  . Financial resource strain: Not hard at all  . Food insecurity:    Worry: Never true     Inability: Never true  . Transportation needs:    Medical: No    Non-medical: No  Tobacco Use  . Smoking status: Current Some Day Smoker    Packs/day: 0.50    Years: 15.00    Pack years: 7.50    Types: Cigarettes  . Smokeless tobacco: Never Used  Substance and Sexual Activity  . Alcohol use: Yes    Alcohol/week: 0.0 standard drinks    Comment: occas  . Drug use: No  . Sexual activity: Yes    Birth control/protection: Condom  Lifestyle  . Physical activity:    Days per week: 5 days    Minutes per session: 50 min  . Stress: To some extent  Relationships  . Social connections:    Talks on phone: More than three times a week    Gets together: More than three times a week    Attends religious service: More than 4 times per year    Active member of club or organization: No    Attends meetings of clubs or organizations: Never    Relationship status: Divorced  . Intimate partner violence:    Fear of current or ex partner: Patient refused  Emotionally abused: Patient refused    Physically abused: Patient refused    Forced sexual activity: Patient refused  Other Topics Concern  . Not on file  Social History Narrative  . Not on file    Outpatient Medications Prior to Visit  Medication Sig Dispense Refill  . loratadine (CLARITIN) 10 MG tablet Take 1 tablet (10 mg total) by mouth daily. 30 tablet 11  . metaxalone (SKELAXIN) 800 MG tablet Take 1 tablet (800 mg total) by mouth 3 (three) times daily. (Patient taking differently: Take 800 mg by mouth 2 (two) times daily as needed (Pain). ) 90 tablet 11  . benzonatate (TESSALON PERLES) 100 MG capsule Take 1 capsule (100 mg total) by mouth 3 (three) times daily as needed for cough. (Patient not taking: Reported on 01/19/2018) 30 capsule 0  . chlorpheniramine-HYDROcodone (TUSSIONEX PENNKINETIC ER) 10-8 MG/5ML SUER Take 5 mLs by mouth 2 (two) times daily. 70 mL 0  . omeprazole (PRILOSEC OTC) 20 MG tablet Take 1 tablet (20 mg total) by  mouth daily. (Patient not taking: Reported on 01/19/2018) 28 tablet 1  . albuterol (PROVENTIL HFA;VENTOLIN HFA) 108 (90 Base) MCG/ACT inhaler Inhale 1-2 puffs into the lungs every 6 (six) hours as needed for wheezing or shortness of breath. (Patient not taking: Reported on 02/18/2018) 1 Inhaler 11  . varenicline (CHANTIX) 1 MG tablet Take 1 tablet (1 mg total) by mouth 2 (two) times daily. (Patient not taking: Reported on 02/12/2018) 60 tablet 1   No facility-administered medications prior to visit.       ROS:  Review of Systems  Constitutional: Negative for chills, fatigue and fever.  HENT: Positive for congestion, postnasal drip and sore throat. Negative for ear pain, rhinorrhea, sinus pressure and sinus pain.   Respiratory: Positive for cough and shortness of breath. Negative for chest tightness and wheezing.   Skin: Negative for rash.  Neurological: Negative for dizziness, light-headedness and headaches.    OBJECTIVE:   Vitals:  BP 131/86   Pulse 71   Temp 98.2 F (36.8 C)   Ht 5\' 1"  (1.549 m)   Wt 92 lb 11.2 oz (42 kg)   LMP 02/06/2018 (Exact Date)   BMI 17.52 kg/m   Physical Exam  Constitutional: She is oriented to person, place, and time.  Neck: Normal range of motion. No thyromegaly present.  Cardiovascular: Normal rate and regular rhythm.  No murmur heard. Pulmonary/Chest: Effort normal and breath sounds normal. No respiratory distress. She has no wheezes. She has no rales.  Lymphadenopathy:       Head (right side): No submandibular, no tonsillar, no preauricular and no posterior auricular adenopathy present.       Head (left side): No submandibular, no tonsillar, no preauricular and no posterior auricular adenopathy present.    She has no cervical adenopathy.  Neurological: She is alert and oriented to person, place, and time.  Psychiatric: Judgment normal.  Vitals reviewed.    Assessment/Plan: Cough - Most likely due to PND. Add Rx flonase, OTC sudafed. Cont  claritin. No RF on tussionex. Neg exam except cough is prod. - Plan: fluticasone (FLONASE) 50 MCG/ACT nasal spray, albuterol (PROVENTIL HFA;VENTOLIN HFA) 108 (90 Base) MCG/ACT inhaler  Postnasal drip  Encounter for smoking cessation counseling - Rx chantix resent to med mgmt.  - Plan: varenicline (CHANTIX) 1 MG tablet    Meds ordered this encounter  Medications  . fluticasone (FLONASE) 50 MCG/ACT nasal spray    Sig: Place 1 spray into both nostrils 2 (  two) times daily.    Dispense:  16 g    Refill:  1    Order Specific Question:   Supervising Provider    Answer:   Nadara MustardHARRIS, ROBERT P [id number]]  . varenicline (CHANTIX) 1 MG tablet    Sig: Take 1 tablet (1 mg total) by mouth 2 (two) times daily.    Dispense:  60 tablet    Refill:  1    Order Specific Question:   Supervising Provider    Answer:   Nadara MustardHARRIS, ROBERT P [id number]]  . albuterol (PROVENTIL HFA;VENTOLIN HFA) 108 (90 Base) MCG/ACT inhaler    Sig: Inhale 1-2 puffs into the lungs every 6 (six) hours as needed for wheezing or shortness of breath.    Dispense:  1 Inhaler    Refill:  11    Order Specific Question:   Supervising Provider    Answer:   Nadara MustardHARRIS, ROBERT P [[id number]]      Return if symptoms worsen or fail to improve.  Kristy Schliep B. Nansi Birmingham, PA-C 02/18/2018 7:28 PM

## 2018-02-19 ENCOUNTER — Emergency Department: Payer: No Typology Code available for payment source

## 2018-02-19 ENCOUNTER — Other Ambulatory Visit: Payer: Self-pay

## 2018-02-19 ENCOUNTER — Emergency Department
Admission: EM | Admit: 2018-02-19 | Discharge: 2018-02-19 | Disposition: A | Discharge status: Home / Self Care | Payer: No Typology Code available for payment source | Attending: Emergency Medicine | Admitting: Emergency Medicine

## 2018-02-19 DIAGNOSIS — Y9389 Activity, other specified: Secondary | ICD-10-CM | POA: Diagnosis not present

## 2018-02-19 DIAGNOSIS — W2210XA Striking against or struck by unspecified automobile airbag, initial encounter: Secondary | ICD-10-CM | POA: Diagnosis not present

## 2018-02-19 DIAGNOSIS — Z79899 Other long term (current) drug therapy: Secondary | ICD-10-CM | POA: Diagnosis not present

## 2018-02-19 DIAGNOSIS — G44311 Acute post-traumatic headache, intractable: Secondary | ICD-10-CM | POA: Insufficient documentation

## 2018-02-19 DIAGNOSIS — Y998 Other external cause status: Secondary | ICD-10-CM | POA: Diagnosis not present

## 2018-02-19 DIAGNOSIS — F17210 Nicotine dependence, cigarettes, uncomplicated: Secondary | ICD-10-CM | POA: Insufficient documentation

## 2018-02-19 DIAGNOSIS — Y92410 Unspecified street and highway as the place of occurrence of the external cause: Secondary | ICD-10-CM | POA: Insufficient documentation

## 2018-02-19 DIAGNOSIS — S199XXA Unspecified injury of neck, initial encounter: Secondary | ICD-10-CM | POA: Diagnosis present

## 2018-02-19 DIAGNOSIS — S161XXA Strain of muscle, fascia and tendon at neck level, initial encounter: Secondary | ICD-10-CM | POA: Diagnosis not present

## 2018-02-19 MED ORDER — CYCLOBENZAPRINE HCL 10 MG PO TABS: 10.0000 mg | ORAL_TABLET | Freq: Three times a day (TID) | ORAL | 0 refills | Status: DC | PRN

## 2018-02-19 MED ORDER — DIAZEPAM 2 MG PO TABS
2.0000 mg | ORAL_TABLET | Freq: Once | ORAL | Status: AC
  Administered 2018-02-19: 2 mg via ORAL
  Filled 2018-02-19: qty 1
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
## 2018-02-22 ENCOUNTER — Telehealth: Payer: Self-pay | Admitting: Pharmacist

## 2018-02-22 NOTE — Telephone Encounter (Signed)
02/22/2018 8:47:06 AM - Ventolin HFA  02/22/18 Received pharmacy printout for Ventolin HFA 1990mcg-Inhale 1-2 puffs into the lungs every 6 hours as needed for wheezing or shortness of breath. Printed [employer] application - mailing patient her portion to sign & return, [facility].Forde RadonAJ

## 2018-02-25 ENCOUNTER — Ambulatory Visit: Payer: Medicaid Other | Admitting: Adult Health Nurse Practitioner

## 2018-02-25 VITALS — BP 136/92 | HR 102 | Temp 98.1°F | Ht 61.0 in | Wt 96.1 lb

## 2018-02-25 DIAGNOSIS — G8929 Other chronic pain: Secondary | ICD-10-CM

## 2018-02-25 DIAGNOSIS — M549 Dorsalgia, unspecified: Principal | ICD-10-CM
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
## 2018-03-04 ENCOUNTER — Encounter: Payer: Self-pay | Admitting: Family Medicine

## 2018-03-04 ENCOUNTER — Ambulatory Visit: Payer: Medicaid Other | Admitting: Family Medicine

## 2018-03-04 VITALS — BP 134/93 | HR 91 | Temp 98.2°F | Ht 61.0 in | Wt 92.3 lb

## 2018-03-04 DIAGNOSIS — M542 Cervicalgia: Secondary | ICD-10-CM
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
## 2018-04-27 ENCOUNTER — Other Ambulatory Visit (HOSPITAL_COMMUNITY)
Admission: RE | Admit: 2018-04-27 | Discharge: 2018-04-27 | Disposition: A | Discharge status: Home / Self Care | Payer: Medicaid Other | Source: Ambulatory Visit | Attending: Obstetrics and Gynecology | Admitting: Obstetrics and Gynecology

## 2018-04-27 ENCOUNTER — Ambulatory Visit (INDEPENDENT_AMBULATORY_CARE_PROVIDER_SITE_OTHER): Payer: Medicaid Other | Admitting: Obstetrics and Gynecology

## 2018-04-27 ENCOUNTER — Encounter: Payer: Self-pay | Admitting: Obstetrics and Gynecology

## 2018-04-27 VITALS — BP 125/86 | Ht 61.0 in | Wt 96.1 lb

## 2018-04-27 DIAGNOSIS — N898 Other specified noninflammatory disorders of vagina: Secondary | ICD-10-CM

## 2018-04-27 DIAGNOSIS — R350 Frequency of micturition: Secondary | ICD-10-CM

## 2018-04-27 LAB — POCT URINALYSIS DIPSTICK
BILIRUBIN UA: NEGATIVE
Blood, UA: NEGATIVE
GLUCOSE UA: NEGATIVE
KETONES UA: NEGATIVE
Leukocytes, UA: NEGATIVE
Nitrite, UA: NEGATIVE
PH UA: 8.5 — AB (ref 5.0–8.0)
Protein, UA: NEGATIVE
Spec Grav, UA: 1.01 (ref 1.010–1.025)
UROBILINOGEN UA: NEGATIVE U/dL — AB

## 2018-04-27 NOTE — Patient Instructions (Signed)
1.  Urinalysis and urine culture are obtained today. 2.  Vaginal swab was obtained to rule out sexually transmitted infections 3.  Wet prep testing today did not reveal any significant infection. 4.  Strongly encourage condom use and avoiding alcohol excess 5.  Follow-up as needed for gynecologic issues.  Labs will be made available in approximately 72 hours.
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
## 2018-04-28 LAB — CERVICOVAGINAL ANCILLARY ONLY
Bacterial vaginitis: NEGATIVE
Candida vaginitis: NEGATIVE
Chlamydia: NEGATIVE
Neisseria Gonorrhea: NEGATIVE
Trichomonas: NEGATIVE

## 2018-06-08 ENCOUNTER — Telehealth: Payer: Self-pay | Admitting: Pharmacist

## 2018-06-08 NOTE — Telephone Encounter (Signed)
06/08/2018 1:57:10 PM - Ventolin refill  06/08/18 Placed refill online with [facility] for Ventolin HFA, to ship 06/25/2018, order# [id number].Forde RadonAJ

## 2018-10-05 ENCOUNTER — Telehealth: Payer: Self-pay | Admitting: Pharmacist

## 2018-10-05 NOTE — Telephone Encounter (Signed)
10/05/2018 11:46:47 AM - Ventolin HFA refill online  10/05/2018 Placed refill online with [facility] for Ventolin HFA, to ship 10/18/2018, order# [id number].Forde Radon

## 2018-10-06 ENCOUNTER — Telehealth: Payer: Self-pay

## 2018-10-06 NOTE — Telephone Encounter (Signed)
Patient called requesting a note for her employer. She states her employer is requiring her to wear a face mask and this makes it very difficult for her to breathe.  I spoke with Lanora Manis, NP in clinic today. She is not comfortable writing this note at this time.  I left a message for patient with this information.

## 2018-11-25 ENCOUNTER — Other Ambulatory Visit: Payer: Self-pay

## 2018-11-25 ENCOUNTER — Other Ambulatory Visit: Payer: Medicaid Other

## 2018-11-25 ENCOUNTER — Ambulatory Visit: Payer: Medicaid Other | Admitting: Urology

## 2018-11-25 DIAGNOSIS — Z716 Tobacco abuse counseling: Secondary | ICD-10-CM

## 2018-11-25 DIAGNOSIS — F419 Anxiety disorder, unspecified: Secondary | ICD-10-CM

## 2018-11-25 MED ORDER — NICOTINE 10 MG IN INHA: RESPIRATORY_TRACT | 0 refills | Status: DC

## 2018-11-25 NOTE — Progress Notes (Signed)
Virtual Visit via Telephone Note  I connected with Kristy Hansen on 11/25/2018 at home by telephone and verified that I am speaking with the correct person using two identifiers.  They are located at home  I am located at my home.    This visit type was conducted due to national recommendations for restrictions regarding the COVID-19 Pandemic (e.g. social distancing).  This format is felt to be most appropriate for this patient at this time.  All issues noted in this document were discussed and addressed.  No physical exam was performed.   I discussed the limitations, risks, security and privacy concerns of performing an evaluation and management service by telephone and the availability of in person appointments. I also discussed with the patient that there may be a patient responsible charge related to this service. The patient expressed understanding and agreed to proceed.   History of Present Illness: She states that she has had 6 months of diarrhea, early satiety alternating with nausea but it has worsen in the last month and a half   Really wants to quit smoking - failed Chantix   Observations/Objective:   Assessment and Plan:  1. Smoking cessation Script given for Nicotrol inhalers   Follow Up Instructions:  Labs tonight.  Pending lab results.   I discussed the assessment and treatment plan with the patient. The patient was provided an opportunity to ask questions and all were answered. The patient agreed with the plan and demonstrated an understanding of the instructions.   The patient was advised to call back or seek an in-person evaluation if the symptoms worsen or if the condition fails to improve as anticipated.  I provided 5 minutes of non-face-to-face time during this encounter.   Carvel Huskins, PA-C

## 2018-11-26 LAB — COMPREHENSIVE METABOLIC PANEL
ALT: 13 IU/L (ref 0–32)
AST: 13 IU/L (ref 0–40)
Albumin/Globulin Ratio: 3.1 — ABNORMAL HIGH (ref 1.2–2.2)
Albumin: 5.2 g/dL — ABNORMAL HIGH (ref 3.9–5.0)
Alkaline Phosphatase: 51 IU/L (ref 39–117)
BUN/Creatinine Ratio: 24 — ABNORMAL HIGH (ref 9–23)
BUN: 14 mg/dL (ref 6–20)
Bilirubin Total: 0.2 mg/dL (ref 0.0–1.2)
CO2: 24 mmol/L (ref 20–29)
Calcium: 9.6 mg/dL (ref 8.7–10.2)
Chloride: 100 mmol/L (ref 96–106)
Creatinine, Ser: 0.59 mg/dL (ref 0.57–1.00)
GFR calc Af Amer: 142 mL/min/{1.73_m2} (ref >59)
GFR calc non Af Amer: 123 mL/min/{1.73_m2} (ref >59)
Globulin, Total: 1.7 g/dL (ref 1.5–4.5)
Glucose: 101 mg/dL — ABNORMAL HIGH (ref 65–99)
Potassium: 4.9 mmol/L (ref 3.5–5.2)
Sodium: 138 mmol/L (ref 134–144)
Total Protein: 6.9 g/dL (ref 6.0–8.5)

## 2018-11-26 LAB — URINALYSIS, COMPLETE
Bilirubin, UA: NEGATIVE
Glucose, UA: NEGATIVE
Ketones, UA: NEGATIVE
Leukocytes,UA: NEGATIVE
Nitrite, UA: NEGATIVE
Protein,UA: NEGATIVE
RBC, UA: NEGATIVE
Specific Gravity, UA: 1.025 (ref 1.005–1.030)
Urobilinogen, Ur: 0.2 mg/dL (ref 0.2–1.0)
pH, UA: 6 (ref 5.0–7.5)

## 2018-11-26 LAB — CBC WITH DIFFERENTIAL/PLATELET
Basophils Absolute: 0 10*3/uL (ref 0.0–0.2)
Basos: 0 %
EOS (ABSOLUTE): 0.1 10*3/uL (ref 0.0–0.4)
Eos: 1 %
Hematocrit: 41.7 % (ref 34.0–46.6)
Hemoglobin: 14.1 g/dL (ref 11.1–15.9)
Immature Grans (Abs): 0 10*3/uL (ref 0.0–0.1)
Immature Granulocytes: 0 %
Lymphocytes Absolute: 2 10*3/uL (ref 0.7–3.1)
Lymphs: 22 %
MCH: 29.6 pg (ref 26.6–33.0)
MCHC: 33.8 g/dL (ref 31.5–35.7)
MCV: 87 fL (ref 79–97)
Monocytes Absolute: 0.7 10*3/uL (ref 0.1–0.9)
Monocytes: 8 %
Neutrophils Absolute: 6 10*3/uL (ref 1.4–7.0)
Neutrophils: 69 %
Platelets: 249 10*3/uL (ref 150–450)
RBC: 4.77 x10E6/uL (ref 3.77–5.28)
RDW: 11.9 % (ref 11.7–15.4)
WBC: 8.9 10*3/uL (ref 3.4–10.8)

## 2018-11-26 LAB — MICROSCOPIC EXAMINATION: Casts: NONE SEEN /lpf

## 2018-11-26 LAB — LIPID PANEL
Chol/HDL Ratio: 3.3 ratio (ref 0.0–4.4)
Cholesterol, Total: 157 mg/dL (ref 100–199)
HDL: 47 mg/dL (ref >39)
LDL Calculated: 97 mg/dL (ref 0–99)
Triglycerides: 63 mg/dL (ref 0–149)
VLDL Cholesterol Cal: 13 mg/dL (ref 5–40)

## 2018-11-26 LAB — HEMOGLOBIN A1C
Est. average glucose Bld gHb Est-mCnc: 97 mg/dL
Hgb A1c MFr Bld: 5 % (ref 4.8–5.6)

## 2018-11-26 LAB — TSH: TSH: 1.31 u[IU]/mL (ref 0.450–4.500)

## 2019-01-06 ENCOUNTER — Telehealth: Payer: Self-pay | Admitting: Pharmacist

## 2019-01-06 NOTE — Telephone Encounter (Signed)
01/06/2019 9:08:21 AM - Ventolin refill to [place]  01/06/2019 Received Ventolin script back from [facility], faxed to [place] for processing.Delos Haring

## 2019-02-10 ENCOUNTER — Other Ambulatory Visit: Payer: Self-pay | Admitting: Family Medicine

## 2019-02-10 DIAGNOSIS — M549 Dorsalgia, unspecified: Secondary | ICD-10-CM

## 2019-02-15 ENCOUNTER — Other Ambulatory Visit: Payer: Self-pay | Admitting: Family Medicine

## 2019-02-15 DIAGNOSIS — M549 Dorsalgia, unspecified: Secondary | ICD-10-CM

## 2019-02-16 ENCOUNTER — Other Ambulatory Visit: Payer: Self-pay

## 2019-02-16 DIAGNOSIS — M549 Dorsalgia, unspecified: Secondary | ICD-10-CM

## 2019-02-16 MED ORDER — METAXALONE 800 MG PO TABS: 800.0000 mg | ORAL_TABLET | Freq: Three times a day (TID) | ORAL | 1 refills | Status: DC

## 2019-04-08 ENCOUNTER — Other Ambulatory Visit: Payer: Self-pay | Admitting: *Deleted

## 2019-04-08 DIAGNOSIS — Z20822 Contact with and (suspected) exposure to covid-19: Secondary | ICD-10-CM

## 2019-04-10 LAB — NOVEL CORONAVIRUS, NAA: SARS-CoV-2, NAA: NOT DETECTED

## 2019-04-27 ENCOUNTER — Other Ambulatory Visit: Payer: Self-pay

## 2019-04-27 DIAGNOSIS — Z20822 Contact with and (suspected) exposure to covid-19: Secondary | ICD-10-CM

## 2019-04-28 LAB — NOVEL CORONAVIRUS, NAA: SARS-CoV-2, NAA: NOT DETECTED

## 2019-04-29 ENCOUNTER — Telehealth: Payer: Self-pay | Admitting: *Deleted

## 2019-04-29 NOTE — Telephone Encounter (Signed)
Pt given result of COVID test; she verbalized understanding. 

## 2019-06-01 ENCOUNTER — Telehealth: Payer: Self-pay | Admitting: Pharmacy Technician

## 2019-06-01 NOTE — Telephone Encounter (Signed)
Patient failed to provide requested 2020 financial documentation.  No additional medication assistance will be provided by [facility] without the required proof of income documentation.  Patient notified by letter.  Valeri Sula J. Ariday Brinker Care Manager [facility] 

## 2019-07-06 ENCOUNTER — Ambulatory Visit: Payer: Medicaid Other | Admitting: Gerontology

## 2019-07-06 ENCOUNTER — Other Ambulatory Visit: Payer: Self-pay

## 2019-07-06 ENCOUNTER — Encounter: Payer: Self-pay | Admitting: Gerontology

## 2019-07-06 VITALS — BP 127/86 | HR 90 | Ht 61.25 in | Wt 100.2 lb

## 2019-07-06 DIAGNOSIS — G8929 Other chronic pain: Secondary | ICD-10-CM

## 2019-07-06 DIAGNOSIS — M549 Dorsalgia, unspecified: Secondary | ICD-10-CM

## 2019-07-06 MED ORDER — METAXALONE 800 MG PO TABS: 800.0000 mg | ORAL_TABLET | Freq: Three times a day (TID) | ORAL | 3 refills | Status: DC

## 2019-07-06 NOTE — Patient Instructions (Signed)

## 2019-07-06 NOTE — Progress Notes (Signed)
Established Patient Office Visit  Subjective:  Patient ID: Kristy Hansen, female    DOB: [birth year]  Age: 34 y.o. MRN: [id number]  CC:  Chief Complaint  Patient presents with  . Back Pain    HPI Kristy Hansen presents for follow up of back pain and medication refill. She has a history of Chiari malformation type 1, and continues to experience intermittent throbbing 5/10 back pain that starts from her neck and radiates to her lower back. She states that over exerting herself aggravates pain and she's out of Metaxalone that relieves symptoms. She denies bladder or bowel incontinence, saddle anesthesia, and muscle weakness. She denies chest pain, palpitation, light headedness, fever and chills. Overall, she states that she's doing well and offers no further complaint.  Past Medical History:  Diagnosis Date  . Anxiety   . Autoimmune disease (HCC)   . Chiari malformation type I (HCC)   . Hematuria, gross   . Panic attack   . Pyelonephritis   . Renal cyst     Past Surgical History:  Procedure Laterality Date  . CYST REMOVAL PEDIATRIC     Spine  . MYRINGOTOMY WITH TUBE PLACEMENT    . SPINE SURGERY      Family History  Problem Relation Age of Onset  . Cancer Mother        breast cancer, ovarian cancer  . Diabetes Mother   . Hypertension Mother   . Diabetes Maternal Grandmother   . Diabetes Maternal Uncle   . Kidney disease Neg Hx   . Bladder Cancer Neg Hx   . Prostate cancer Neg Hx     Social History   Socioeconomic History  . Marital status: Legally Separated    Spouse name: Not on file  . Number of children: Not on file  . Years of education: Not on file  . Highest education level: Not on file  Occupational History  . Not on file  Tobacco Use  . Smoking status: Current Some Day Smoker    Packs/day: 0.50    Years: 15.00    Pack years: 7.50    Types: Cigarettes  . Smokeless tobacco: Never Used  Substance and Sexual Activity  . Alcohol use: Yes   Alcohol/week: 0.0 standard drinks    Comment: occas  . Drug use: No  . Sexual activity: Yes    Birth control/protection: Condom  Other Topics Concern  . Not on file  Social History Narrative  . Not on file   Social Determinants of Health   Financial Resource Strain:   . Difficulty of Paying Living Expenses: Not on file  Food Insecurity:   . Worried About Programme researcher, broadcasting/film/video in the Last Year: Not on file  . Ran Out of Food in the Last Year: Not on file  Transportation Needs:   . Lack of Transportation (Medical): Not on file  . Lack of Transportation (Non-Medical): Not on file  Physical Activity:   . Days of Exercise per Week: Not on file  . Minutes of Exercise per Session: Not on file  Stress:   . Feeling of Stress : Not on file  Social Connections:   . Frequency of Communication with Friends and Family: Not on file  . Frequency of Social Gatherings with Friends and Family: Not on file  . Attends Religious Services: Not on file  . Active Member of Clubs or Organizations: Not on file  . Attends Banker Meetings: Not on file  .  Marital Status: Not on file  Intimate Partner Violence:   . Fear of Current or Ex-Partner: Not on file  . Emotionally Abused: Not on file  . Physically Abused: Not on file  . Sexually Abused: Not on file    Outpatient Medications Prior to Visit  Medication Sig Dispense Refill  . fluticasone (FLONASE) 50 MCG/ACT nasal spray Place 1 spray into both nostrils 2 (two) times daily. 16 g 1  . loratadine (CLARITIN) 10 MG tablet Take 1 tablet (10 mg total) by mouth daily. 30 tablet 11  . metaxalone (SKELAXIN) 800 MG tablet Take 1 tablet (800 mg total) by mouth 3 (three) times daily. 90 tablet 1  . nicotine (NICOTROL) 10 MG inhaler 6-16 catridges/day inhaled x 6-12 weeks, then taper dose over 6-12 week to discontinue smoking 42 each 0  . omeprazole (PRILOSEC OTC) 20 MG tablet Take 1 tablet (20 mg total) by mouth daily. 28 tablet 1  . varenicline  (CHANTIX) 1 MG tablet Take 1 tablet (1 mg total) by mouth 2 (two) times daily. 60 tablet 1   No facility-administered medications prior to visit.    Allergies  Allergen Reactions  . Penicillins Anaphylaxis, Shortness Of Breath and Swelling    Has patient had a PCN reaction causing immediate rash, facial/tongue/throat swelling, SOB or lightheadedness with hypotension: Yes Has patient had a PCN reaction causing severe rash involving mucus membranes or skin necrosis: Unknown Has patient had a PCN reaction that required hospitalization: No Has patient had a PCN reaction occurring within the last 10 years: No If all of the above answers are "NO", then may proceed with Cephalosporin use.   . Aspirin Hives  . Naprosyn [Naproxen] Hives  . Tramadol Hives    ROS Review of Systems  Constitutional: Negative.   Respiratory: Negative.   Cardiovascular: Negative.   Musculoskeletal: Positive for back pain.  Skin: Negative.   Neurological: Negative.   Psychiatric/Behavioral: Negative.       Objective:    Physical Exam  Constitutional: She is oriented to person, place, and time. She appears well-developed.  HENT:  Head: Normocephalic and atraumatic.  Eyes: Pupils are equal, round, and reactive to light. EOM are normal.  Cardiovascular: Normal rate and regular rhythm.  Pulmonary/Chest: Effort normal and breath sounds normal.  Abdominal: Soft. Bowel sounds are normal.  Musculoskeletal:        General: Normal range of motion.  Neurological: She is alert and oriented to person, place, and time.  Skin: Skin is warm and dry.  Psychiatric: She has a normal mood and affect. Her behavior is normal. Judgment and thought content normal.    BP 127/86 (BP Location: Left Arm, Patient Position: Sitting)   Pulse 90   Ht 5' 1.25" (1.556 m)   Wt 100 lb 3.2 oz (45.5 kg)   SpO2 98%   BMI 18.78 kg/m  Wt Readings from Last 3 Encounters:  07/06/19 100 lb 3.2 oz (45.5 kg)  04/27/18 96 lb 1.6 oz (43.6  kg)  03/04/18 92 lb 4.8 oz (41.9 kg)     Health Maintenance Due  Topic Date Due  . HIV Screening  01/01/2003  . PAP SMEAR-Modifier  07/24/2019    There are no preventive care reminders to display for this patient.  Lab Results  Component Value Date   TSH 1.310 11/25/2018   Lab Results  Component Value Date   WBC 8.9 11/25/2018   HGB 14.1 11/25/2018   HCT 41.7 11/25/2018   MCV 87 11/25/2018  PLT 249 11/25/2018   Lab Results  Component Value Date   NA 138 11/25/2018   K 4.9 11/25/2018   CO2 24 11/25/2018   GLUCOSE 101 (H) 11/25/2018   BUN 14 11/25/2018   CREATININE 0.59 11/25/2018   BILITOT 0.2 11/25/2018   ALKPHOS 51 11/25/2018   AST 13 11/25/2018   ALT 13 11/25/2018   PROT 6.9 11/25/2018   ALBUMIN 5.2 (H) 11/25/2018   CALCIUM 9.6 11/25/2018   ANIONGAP 8 02/12/2018   Lab Results  Component Value Date   CHOL 157 11/25/2018   Lab Results  Component Value Date   HDL 47 11/25/2018   Lab Results  Component Value Date   LDLCALC 97 11/25/2018   Lab Results  Component Value Date   TRIG 63 11/25/2018   Lab Results  Component Value Date   CHOLHDL 3.3 11/25/2018   Lab Results  Component Value Date   HGBA1C 5.0 11/25/2018      Assessment & Plan:   1. Back pain, unspecified back location, unspecified back pain laterality, unspecified chronicity - She will continue on current treatment regimen, and was advised to notify clinic for worsening symptoms. - metaxalone (SKELAXIN) 800 MG tablet; Take 1 tablet (800 mg total) by mouth 3 (three) times daily.  Dispense: 90 tablet; Refill: 3      Follow-up: Return in about 6 months (around 01/03/2020), or if symptoms worsen or fail to improve.    Kawehi Hostetter Trellis Paganini, NP

## 2019-07-12 ENCOUNTER — Ambulatory Visit: Payer: Medicaid Other

## 2019-07-13 ENCOUNTER — Other Ambulatory Visit: Payer: Self-pay

## 2019-07-13 ENCOUNTER — Ambulatory Visit: Payer: Medicaid Other | Admitting: Pharmacy Technician

## 2019-07-13 ENCOUNTER — Other Ambulatory Visit: Payer: Self-pay | Admitting: Gerontology

## 2019-07-13 DIAGNOSIS — R059 Cough, unspecified: Secondary | ICD-10-CM

## 2019-07-13 DIAGNOSIS — Z79899 Other long term (current) drug therapy: Secondary | ICD-10-CM

## 2019-07-13 DIAGNOSIS — R05 Cough: Secondary | ICD-10-CM

## 2019-07-13 NOTE — Progress Notes (Signed)
Completed [facility] application and contract.  Patient agreed to all terms of the [facility] contract.    Patient approved to receive medication assistance at [facility] until time for recertification in 5053, and as long as eligibility criteria continues to be met.    Provided patient with community resource material based on her particular needs.    Skelaxin & Ventolin Prescription Applications completed with patient.  Forwarded to [facility] for signature.  Upon receipt of signed applications from provider, Skelaxin Prescription Application will be submitted to [place] and Ventolin Prescription Application will be submitted to [place].  [place] [facility]

## 2019-07-21 ENCOUNTER — Telehealth: Payer: Self-pay | Admitting: Pharmacist

## 2019-07-21 NOTE — Telephone Encounter (Signed)
07/21/2019 10:49:35 AM - Skelaxin faxed to [employer] -- Rhetta Mura - Thursday, July 21, 2019 10:48 AM -- Arneta Cliche [employer] application for Skelaxin 800mg  Take one tablet by mouth 3 times a day.AJ  07/21/2019 10:48:43 AM - Ventolin faxed to [facility]  -- 09/18/2019 - Thursday, July 21, 2019 10:47 AM -- I faxed [facility] application for Ventolin HFA July 23, 2019 Inhale 1-2 puffs into the lungs every 6 hours as needed for wheezing or shortness of breath. 

## 2019-07-25 ENCOUNTER — Ambulatory Visit: Payer: Medicaid Other

## 2019-07-25 ENCOUNTER — Other Ambulatory Visit: Payer: Self-pay

## 2019-07-25 DIAGNOSIS — Z79899 Other long term (current) drug therapy: Secondary | ICD-10-CM

## 2019-07-25 NOTE — Progress Notes (Signed)
[facility]ication Management Clinic Visit Note  Patient: Kristy Hansen MRN: [id number] Date of Birth: May 25, 1986 PCP: System, Pcp Not In   Kristy Hansen 34 y.o. female presents for a telephone medication therapy management visit with the pharmacist today. Patient identified using two patient identifiers.  There were no vitals taken for this visit.  Patient Information   Past Medical History:  Diagnosis Date  . Anxiety   . Autoimmune disease (HCC)   . Chiari malformation type I (HCC)   . Hematuria, gross   . Panic attack   . Pyelonephritis   . Renal cyst       Past Surgical History:  Procedure Laterality Date  . CYST REMOVAL PEDIATRIC     Spine  . MYRINGOTOMY WITH TUBE PLACEMENT    . SPINE SURGERY       Family History  Problem Relation Age of Onset  . Cancer Mother        breast cancer, ovarian cancer  . Diabetes Mother   . Hypertension Mother   . Diabetes Maternal Grandmother   . Diabetes Maternal Uncle   . Kidney disease Neg Hx   . Bladder Cancer Neg Hx   . Prostate cancer Neg Hx     New Diagnoses (since last visit): N/A  Family Support: Good    Social History   Substance and Sexual Activity  Alcohol Use Yes  . Alcohol/week: 0.0 standard drinks   Comment: occas   Infrequently (2x/year)   Social History   Tobacco Use  Smoking Status Current Some Day Smoker  . Packs/day: 0.50  . Years: 15.00  . Pack years: 7.50  . Types: Cigarettes  Smokeless Tobacco Never Used   Baseline: 1/2 PPD. If stressed will smoke up to 1 PPD. Has attempted smoking cessation in the past. History of nightmares with Chantix. Has tried NRT with nicotine gum (increased cravings), patches (felt sick), and inhaler (irritated throat).  Denies illicit drug use.    Health Maintenance  Topic Date Due  . HIV Screening  01/01/2003  . PAP SMEAR-Modifier  07/24/2019  . TETANUS/TDAP  05/07/2027  . INFLUENZA VACCINE  Completed   Outpatient Encounter Medications as of 07/25/2019   Medication Sig  . acetaminophen (TYLENOL) 325 MG tablet Take 650 mg by mouth every 6 (six) hours as needed for headache.  . albuterol (VENTOLIN HFA) 108 (90 Base) MCG/ACT inhaler Inhale 2 puffs into the lungs every 6 (six) hours as needed for wheezing or shortness of breath.  . loratadine (CLARITIN) 10 MG tablet Take 1 tablet (10 mg total) by mouth daily.  . metaxalone (SKELAXIN) 800 MG tablet Take 1 tablet (800 mg total) by mouth 3 (three) times daily.  . fluticasone (FLONASE) 50 MCG/ACT nasal spray PLACE 1 SPRAY INTO BOTH NOSTRILS 2 TIMES A DAY (Patient not taking: Reported on 07/25/2019)   No facility-administered encounter medications on file as of 07/25/2019.    Health Maintenance/Date Completed  Last ED visit: None <1 year Last Visit to PCP: 07/06/2019 [facility]) Next Visit to PCP: 12/07/2019 [facility]) Specialist Visit: N/A Dental Exam: Not addressed Eye Exam: Does not require corrective lenses Pelvic/PAP Exam: Reports next is due in 02/2020. Mother with history of ovarian cancer Mammogram: Never. Mother with history of breast cancer DEXA: < 31 y/o Colonoscopy: N/A Flu Vaccine: Not interested Pneumonia Vaccine: Never COVID-19 Vaccine: Never Shingrix Vaccine: < 53 y/o  Assessment and Plan:  1. Back pain -Metaxalone 800 mg three times daily -Endorses improvement since starting medication  2. Tobacco  use -Currently smoking 1/2 PPD. Up to 1 PPD if experiencing stress.  -Has failed Chantix (nightmares), and NRT with gum, patches, and nicotine inhaler -Patient is aware of [place] quit line resource -Albuterol HFA PRN shortness of breath  3. Allergies -Loratadine 10 mg PRN, Flonase nasal spray (out of this medication)  4. Headaches -Tylenol PRN  -Hx of Chiari malformation type 1  5. Health maintenance -Due for annual influenza vaccine, 1 dose PPSV23 (tobacco use), COVID-19 vaccine  6. Adherence -Patient endorses taking medications as prescribed -Requesting refills on Flonase nasal  spray and metaxalone  RTC 1 year for medication therapy management  Ter[place] Resident 25 July 2019

## 2019-08-05 ENCOUNTER — Other Ambulatory Visit: Payer: Self-pay

## 2019-10-07 ENCOUNTER — Telehealth: Payer: Self-pay | Admitting: Pharmacist

## 2019-10-07 NOTE — Telephone Encounter (Signed)
10/07/2019 11:59:07 AM - Ventolin refill online with [facility] -- Rhetta Mura - Friday, October 07, 2019 11:58 AM -- Placed refill online with [facility] for Ventolin, to ship 10/20/2019, order# [id number].

## 2019-12-07 ENCOUNTER — Ambulatory Visit: Payer: Medicaid Other | Admitting: Gerontology

## 2019-12-15 ENCOUNTER — Other Ambulatory Visit: Payer: Self-pay

## 2019-12-15 ENCOUNTER — Ambulatory Visit: Payer: Medicaid Other | Admitting: Gerontology

## 2019-12-15 ENCOUNTER — Encounter: Payer: Self-pay | Admitting: Gerontology

## 2019-12-15 VITALS — BP 134/87 | HR 103 | Temp 98.1°F | Ht 61.25 in | Wt 103.3 lb

## 2019-12-15 DIAGNOSIS — R059 Cough, unspecified: Secondary | ICD-10-CM | POA: Insufficient documentation

## 2019-12-15 MED ORDER — BENZONATATE 100 MG PO CAPS: 100.0000 mg | ORAL_CAPSULE | Freq: Three times a day (TID) | ORAL | 0 refills | Status: DC | PRN

## 2019-12-15 NOTE — Patient Instructions (Signed)

## 2019-12-15 NOTE — Progress Notes (Signed)
Established Patient Office Visit  Subjective:  Patient ID: Kristy Hansen, female    DOB: 1986-01-12  Age: 34 y.o. MRN: [id number]  CC:  Chief Complaint  Patient presents with  . Cough    related to smoking, sounds like croup, makes chest hurts-burns, ongoing for 3.5 weeks, difficulty sleeping, took azithromycin-helped with sinus symptoms but not cough, neg for Covid    HPI Kristy Hansen presents for cough. Her symptoms started 3 weeks ago when she developed rhinorrhea, nasal congestion, facial pressure/pain and headaches. She reports being around a friend who was sick just prior to onset of symptoms. She went and got a COVID-19 test which was negative. She says this felt like previous sinus infections, and she happened to have a full Zpak at home, so she took that and all her symptoms improved with the exception of her cough. Her cough has lingered for about 3 weeks. She says this is productive of white mucus, and worse at night. She has used her inhaler with some relief. She also reports some burning substernal pain due to cough, which she thinks is a pulled muscle. This pain improves when taking her skelaxin and worsens when coughing. She denies fever, fatigue, or shortness of breath. She continues to smoke 1/2 ppd, and is not interested in discussing smoking cessation at this time. Aside from her cough she is doing well without additional complaints.    Past Medical History:  Diagnosis Date  . Anxiety   . Autoimmune disease (HCC)   . Chiari malformation type I (HCC)   . Hematuria, gross   . Panic attack   . Pyelonephritis   . Renal cyst     Past Surgical History:  Procedure Laterality Date  . CYST REMOVAL PEDIATRIC     Spine  . MYRINGOTOMY WITH TUBE PLACEMENT    . SPINE SURGERY      Family History  Problem Relation Age of Onset  . Cancer Mother        breast cancer, ovarian cancer  . Diabetes Mother   . Hypertension Mother   . Diabetes Maternal Grandmother   .  Diabetes Maternal Uncle   . Kidney disease Neg Hx   . Bladder Cancer Neg Hx   . Prostate cancer Neg Hx     Social History   Socioeconomic History  . Marital status: Legally Separated    Spouse name: Not on file  . Number of children: Not on file  . Years of education: Not on file  . Highest education level: Not on file  Occupational History  . Not on file  Tobacco Use  . Smoking status: Current Some Day Smoker    Packs/day: 0.50    Years: 15.00    Pack years: 7.50    Types: Cigarettes  . Smokeless tobacco: Never Used  Vaping Use  . Vaping Use: Never used  Substance and Sexual Activity  . Alcohol use: Yes    Alcohol/week: 0.0 standard drinks    Comment: occas  . Drug use: No  . Sexual activity: Yes    Birth control/protection: Condom  Other Topics Concern  . Not on file  Social History Narrative  . Not on file   Social Determinants of Health   Financial Resource Strain:   . Difficulty of Paying Living Expenses:   Food Insecurity:   . Worried About Programme researcher, broadcasting/film/video in the Last Year:   . Barista in the Last Year:   Transportation Needs:   .  Lack of Transportation (Medical):   Marland Kitchen Lack of Transportation (Non-Medical):   Physical Activity:   . Days of Exercise per Week:   . Minutes of Exercise per Session:   Stress:   . Feeling of Stress :   Social Connections:   . Frequency of Communication with Friends and Family:   . Frequency of Social Gatherings with Friends and Family:   . Attends Religious Services:   . Active Member of Clubs or Organizations:   . Attends Banker Meetings:   Marland Kitchen Marital Status:   Intimate Partner Violence:   . Fear of Current or Ex-Partner:   . Emotionally Abused:   Marland Kitchen Physically Abused:   . Sexually Abused:     Outpatient Medications Prior to Visit  Medication Sig Dispense Refill  . acetaminophen (TYLENOL) 325 MG tablet Take 650 mg by mouth every 6 (six) hours as needed for headache.    . albuterol (VENTOLIN  HFA) 108 (90 Base) MCG/ACT inhaler Inhale 2 puffs into the lungs every 6 (six) hours as needed for wheezing or shortness of breath.    Marland Kitchen FLUoxetine (PROZAC) 20 MG tablet Take 20 mg by mouth daily.    . fluticasone (FLONASE) 50 MCG/ACT nasal spray PLACE 1 SPRAY INTO BOTH NOSTRILS 2 TIMES A DAY 16 g 0  . loratadine (CLARITIN) 10 MG tablet Take 1 tablet (10 mg total) by mouth daily. 30 tablet 11  . metaxalone (SKELAXIN) 800 MG tablet Take 1 tablet (800 mg total) by mouth 3 (three) times daily. 90 tablet 3   No facility-administered medications prior to visit.    Allergies  Allergen Reactions  . Penicillins Anaphylaxis, Shortness Of Breath and Swelling    Has patient had a PCN reaction causing immediate rash, facial/tongue/throat swelling, SOB or lightheadedness with hypotension: Yes Has patient had a PCN reaction causing severe rash involving mucus membranes or skin necrosis: Unknown Has patient had a PCN reaction that required hospitalization: No Has patient had a PCN reaction occurring within the last 10 years: No If all of the above answers are "NO", then may proceed with Cephalosporin use.   . Chantix [Varenicline] Other (See Comments)    Nightmares  . Aspirin Hives  . Naprosyn [Naproxen] Hives  . Tramadol Hives    ROS Review of Systems  Constitutional: Negative for fatigue and fever.  HENT: Negative for congestion, rhinorrhea, sinus pressure, sinus pain and sore throat.   Eyes: Negative for pain.  Respiratory: Positive for cough. Negative for shortness of breath.   Cardiovascular: Negative for chest pain.  Musculoskeletal: Positive for myalgias (chest).      Objective:    Physical Exam Constitutional:      Appearance: Normal appearance.  HENT:     Head: Normocephalic and atraumatic.     Nose: Nose normal.  Cardiovascular:     Rate and Rhythm: Normal rate and regular rhythm.     Heart sounds: Normal heart sounds. No murmur heard.  No friction rub. No gallop.    Pulmonary:     Effort: Pulmonary effort is normal.     Breath sounds: Normal breath sounds. No wheezing, rhonchi or rales.  Skin:    General: Skin is warm and dry.  Neurological:     Mental Status: She is alert.  Psychiatric:        Speech: Speech normal.        Behavior: Behavior normal. Behavior is cooperative.     BP 134/87 (BP Location: Left Arm, Patient Position: Sitting)  Pulse (!) 103   Temp 98.1 F (36.7 C)   Ht 5' 1.25" (1.556 m)   Wt 103 lb 4.8 oz (46.9 kg)   SpO2 100%   BMI 19.36 kg/m  Wt Readings from Last 3 Encounters:  12/15/19 103 lb 4.8 oz (46.9 kg)  07/06/19 100 lb 3.2 oz (45.5 kg)  04/27/18 96 lb 1.6 oz (43.6 kg)     Health Maintenance Due  Topic Date Due  . Hepatitis C Screening  Never done  . COVID-19 Vaccine (1) Never done  . HIV Screening  Never done  . PAP SMEAR-Modifier  07/24/2019    There are no preventive care reminders to display for this patient.  Lab Results  Component Value Date   TSH 1.310 11/25/2018   Lab Results  Component Value Date   WBC 8.9 11/25/2018   HGB 14.1 11/25/2018   HCT 41.7 11/25/2018   MCV 87 11/25/2018   PLT 249 11/25/2018   Lab Results  Component Value Date   NA 138 11/25/2018   K 4.9 11/25/2018   CO2 24 11/25/2018   GLUCOSE 101 (H) 11/25/2018   BUN 14 11/25/2018   CREATININE 0.59 11/25/2018   BILITOT 0.2 11/25/2018   ALKPHOS 51 11/25/2018   AST 13 11/25/2018   ALT 13 11/25/2018   PROT 6.9 11/25/2018   ALBUMIN 5.2 (H) 11/25/2018   CALCIUM 9.6 11/25/2018   ANIONGAP 8 02/12/2018   Lab Results  Component Value Date   CHOL 157 11/25/2018   Lab Results  Component Value Date   HDL 47 11/25/2018   Lab Results  Component Value Date   LDLCALC 97 11/25/2018   Lab Results  Component Value Date   TRIG 63 11/25/2018   Lab Results  Component Value Date   CHOLHDL 3.3 11/25/2018   Lab Results  Component Value Date   HGBA1C 5.0 11/25/2018      Assessment & Plan:   1. Cough - She will  begin taking tessalon perles and continue to use her inhaler as needed. She was advised to rest, increase fluid intake, and notify clinic for worsening symptoms.  - benzonatate (TESSALON PERLES) 100 MG capsule; Take 1 capsule (100 mg total) by mouth 3 (three) times daily as needed for cough.  Dispense: 20 capsule; Refill: 0  Follow-up: Return in about 22 weeks (around 05/17/2020), or if symptoms worsen or fail to improve.    Gwenlyn Fudge, Student-PA

## 2020-01-26 ENCOUNTER — Telehealth: Payer: Self-pay | Admitting: Pharmacist

## 2020-01-26 NOTE — Telephone Encounter (Signed)
01/26/2020 10:50:59 AM - Skelaxin refill called to [employer]  -- Rhetta Mura - Thursday, January 26, 2020 10:48 AM --[facility] spoke with Caryn Bee for refill on Skelaxin, allow 7-10 business days to receive, order# [id number], patient enrolled till 07-26-20. NEXT REFILL DUE 04/03/2020-THAT WILL BE LAST FILL

## 2020-01-29 ENCOUNTER — Other Ambulatory Visit: Payer: Self-pay | Admitting: Emergency Medicine

## 2020-02-03 ENCOUNTER — Telehealth: Payer: Self-pay | Admitting: Pharmacist

## 2020-02-03 NOTE — Telephone Encounter (Signed)
02/03/2020 12:07:22 PM - Epipen to pt & dr  -- Rhetta Mura - Friday, February 03, 2020 12:06 PM --Received pharmacy printout for Epipen 0.3mg  (2pk) Inject as directed or as needed. Publishing copy, mailing patient her portion to sign & return, sending provider portion to [facility].

## 2020-02-08 ENCOUNTER — Encounter: Payer: Self-pay | Admitting: Gerontology

## 2020-02-08 ENCOUNTER — Ambulatory Visit: Payer: Medicaid Other | Admitting: Gerontology

## 2020-02-08 ENCOUNTER — Other Ambulatory Visit: Payer: Self-pay

## 2020-02-08 VITALS — BP 120/80 | HR 77 | Ht 61.25 in | Wt 107.0 lb

## 2020-02-08 DIAGNOSIS — R5383 Other fatigue: Secondary | ICD-10-CM | POA: Insufficient documentation

## 2020-02-08 DIAGNOSIS — R059 Cough, unspecified: Secondary | ICD-10-CM

## 2020-02-08 DIAGNOSIS — H538 Other visual disturbances: Secondary | ICD-10-CM

## 2020-02-08 DIAGNOSIS — M549 Dorsalgia, unspecified: Secondary | ICD-10-CM

## 2020-02-08 MED ORDER — GUAIFENESIN ER 600 MG PO TB12: 600.0000 mg | ORAL_TABLET | Freq: Two times a day (BID) | ORAL | 0 refills | Status: DC

## 2020-02-08 NOTE — Patient Instructions (Signed)

## 2020-02-08 NOTE — Progress Notes (Signed)
Established Patient Office Visit  Subjective:  Patient ID: Kristy Hansen, female    DOB: 1986-05-28  Age: 34 y.o. MRN: [id number]  CC:  Chief Complaint  Patient presents with   Fatigue    ongoing for about a month, still has smokers cough    HPI Kristy Hansen presents for F/U productive cough that has been going on for more than 3 months. She states that she coughs up brownish green phlegm and it wakes her up at night. She reports that she hallucinates with taking Benzonatate for cough. She continues to smoke 1/2 to 1 pack of cigarette daily and denies the desire to quit.She states that she has been smoking for 20 years. She also experiences intermittent shortness of breath with exertion. She also c/o of blurry vision and states that she will follow up at the Ophthalmologist. She also reports that she being experiencing intermittent fatigue, denies fever and chills. Overall, she states that she's doing well and offers no further complaint.    Past Medical History:  Diagnosis Date   Anxiety    Autoimmune disease ([place])    Chiari malformation type I ([place])    Hematuria, gross    Panic attack    Pyelonephritis    Renal cyst     Past Surgical History:  Procedure Laterality Date   CYST REMOVAL PEDIATRIC     Spine   MYRINGOTOMY WITH TUBE PLACEMENT     SPINE SURGERY      Family History  Problem Relation Age of Onset   Cancer Mother        breast cancer, ovarian cancer   Diabetes Mother    Hypertension Mother    Diabetes Maternal Grandmother    Diabetes Maternal Uncle    Kidney disease Neg Hx    Bladder Cancer Neg Hx    Prostate cancer Neg Hx     Social History   Socioeconomic History   Marital status: Legally Separated    Spouse name: Not on file   Number of children: Not on file   Years of education: Not on file   Highest education level: Not on file  Occupational History   Not on file  Tobacco Use   Smoking status: Current Some  Day Smoker    Packs/day: 0.50    Years: 15.00    Pack years: 7.50    Types: Cigarettes   Smokeless tobacco: Never Used  Vaping Use   Vaping Use: Never used  Substance and Sexual Activity   Alcohol use: Yes    Alcohol/week: 0.0 standard drinks    Comment: occas   Drug use: No   Sexual activity: Yes    Birth control/protection: Condom  Other Topics Concern   Not on file  Social History Narrative   Not on file   Social Determinants of Health   Financial Resource Strain:    Difficulty of Paying Living Expenses: Not on file  Food Insecurity:    Worried About Charity fundraiser in the Last Year: Not on file   [employer] of Food in the Last Year: Not on file  Transportation Needs:    Lack of Transportation (Medical): Not on file   Lack of Transportation (Non-Medical): Not on file  Physical Activity:    Days of Exercise per Week: Not on file   Minutes of Exercise per Session: Not on file  Stress:    Feeling of Stress : Not on file  Social Connections:    Frequency  of Communication with Friends and Family: Not on file   Frequency of Social Gatherings with Friends and Family: Not on file   Attends Religious Services: Not on file   Active Member of Clubs or Organizations: Not on file   Attends Archivist Meetings: Not on file   Marital Status: Not on file  Intimate Partner Violence:    Fear of Current or Ex-Partner: Not on file   Emotionally Abused: Not on file   Physically Abused: Not on file   Sexually Abused: Not on file    Outpatient Medications Prior to Visit  Medication Sig Dispense Refill   acetaminophen (TYLENOL) 325 MG tablet Take 650 mg by mouth every 6 (six) hours as needed for headache.     albuterol (VENTOLIN HFA) 108 (90 Base) MCG/ACT inhaler Inhale 2 puffs into the lungs every 6 (six) hours as needed for wheezing or shortness of breath.     FLUoxetine (PROZAC) 20 MG tablet Take 40 mg by mouth daily.      fluticasone  (FLONASE) 50 MCG/ACT nasal spray PLACE 1 SPRAY INTO BOTH NOSTRILS 2 TIMES A DAY 16 g 0   metaxalone (SKELAXIN) 800 MG tablet Take 1 tablet (800 mg total) by mouth 3 (three) times daily. 90 tablet 3   loratadine (CLARITIN) 10 MG tablet Take 1 tablet (10 mg total) by mouth daily. (Patient not taking: Reported on 02/08/2020) 30 tablet 11   benzonatate (TESSALON PERLES) 100 MG capsule Take 1 capsule (100 mg total) by mouth 3 (three) times daily as needed for cough. (Patient not taking: Reported on 02/08/2020) 20 capsule 0   No facility-administered medications prior to visit.    Allergies  Allergen Reactions   Penicillins Anaphylaxis, Shortness Of Breath and Swelling    Has patient had a PCN reaction causing immediate rash, facial/tongue/throat swelling, SOB or lightheadedness with hypotension: Yes Has patient had a PCN reaction causing severe rash involving mucus membranes or skin necrosis: Unknown Has patient had a PCN reaction that required hospitalization: No Has patient had a PCN reaction occurring within the last 10 years: No If all of the above answers are "NO", then may proceed with Cephalosporin use.    Chantix [Varenicline] Other (See Comments)    Nightmares   Aspirin Hives   Benzonatate     Hallucination   Naprosyn [Naproxen] Hives   Tramadol Hives    ROS Review of Systems  Constitutional: Positive for fatigue. Negative for chills and fever.  HENT: Negative for postnasal drip, rhinorrhea and sore throat.   Eyes: Positive for visual disturbance.  Respiratory: Positive for cough (brownish phelgm) and shortness of breath (chronic intermittent sob with exartion). Negative for chest tightness and wheezing.   Cardiovascular: Negative.       Objective:    Physical Exam HENT:     Head: Normocephalic and atraumatic.  Cardiovascular:     Rate and Rhythm: Normal rate and regular rhythm.  Pulmonary:     Effort: Pulmonary effort is normal.     Breath sounds: Examination of  the right-upper field reveals rhonchi. Examination of the right-middle field reveals rhonchi. Examination of the right-lower field reveals rhonchi. Rhonchi present.  Neurological:     Mental Status: She is alert.     BP 120/80 (BP Location: Left Arm, Patient Position: Sitting)    Pulse 77    Ht 5' 1.25" (1.556 m)    Wt 107 lb (48.5 kg)    SpO2 98%    BMI 20.05 kg/m  Wt Readings from Last 3 Encounters:  02/08/20 107 lb (48.5 kg)  12/15/19 103 lb 4.8 oz (46.9 kg)  07/06/19 100 lb 3.2 oz (45.5 kg)     Health Maintenance Due  Topic Date Due   Hepatitis C Screening  Never done   COVID-19 Vaccine (1) Never done   HIV Screening  Never done   PAP SMEAR-Modifier  07/24/2019   INFLUENZA VACCINE  01/08/2020    There are no preventive care reminders to display for this patient.  Lab Results  Component Value Date   TSH 1.310 11/25/2018   Lab Results  Component Value Date   WBC 8.9 11/25/2018   HGB 14.1 11/25/2018   HCT 41.7 11/25/2018   MCV 87 11/25/2018   PLT 249 11/25/2018   Lab Results  Component Value Date   NA 138 11/25/2018   K 4.9 11/25/2018   CO2 24 11/25/2018   GLUCOSE 101 (H) 11/25/2018   BUN 14 11/25/2018   CREATININE 0.59 11/25/2018   BILITOT 0.2 11/25/2018   ALKPHOS 51 11/25/2018   AST 13 11/25/2018   ALT 13 11/25/2018   PROT 6.9 11/25/2018   ALBUMIN 5.2 (H) 11/25/2018   CALCIUM 9.6 11/25/2018   ANIONGAP 8 02/12/2018   Lab Results  Component Value Date   CHOL 157 11/25/2018   Lab Results  Component Value Date   HDL 47 11/25/2018   Lab Results  Component Value Date   LDLCALC 97 11/25/2018   Lab Results  Component Value Date   TRIG 63 11/25/2018   Lab Results  Component Value Date   CHOLHDL 3.3 11/25/2018   Lab Results  Component Value Date   HGBA1C 5.0 11/25/2018      Assessment & Plan:     1. Cough - She has rhonchi to right lung field and - DG Chest 2 View; Future - She will continue on  guaiFENesin (MUCINEX) 600 MG 12 hr  tablet; Take 1 tablet (600 mg total) by mouth 2 (two) times daily.  Dispense: 30 tablet; Refill: 0 - She was advised to use Albuterol inhaler as needed for shortness of breath and to notify clinic for worsening symptoms.  2. Blurry vision, bilateral - She was advised to follow up with [facility] and notify clinic  3. Fatigue, unspecified type - Will check routine labs to rule out Anemia. - CBC w/Diff; Future - Comp Met (CMET); Future - Vitamin D (25 hydroxy); Future - Urinalysis; Future    Follow-up: Return in about 2 weeks (around 02/22/2020), or if symptoms worsen or fail to improve.    Ilay Capshaw Jerold Coombe, NP

## 2020-02-14 ENCOUNTER — Ambulatory Visit
Admission: RE | Admit: 2020-02-14 | Discharge: 2020-02-14 | Disposition: A | Discharge status: Home / Self Care | Payer: Self-pay | Source: Ambulatory Visit | Attending: Gerontology | Admitting: Gerontology

## 2020-02-14 ENCOUNTER — Other Ambulatory Visit: Payer: Self-pay

## 2020-02-14 ENCOUNTER — Ambulatory Visit
Admission: RE | Admit: 2020-02-14 | Discharge: 2020-02-14 | Disposition: A | Discharge status: Home / Self Care | Payer: Self-pay | Attending: Gerontology | Admitting: Gerontology

## 2020-02-14 DIAGNOSIS — R059 Cough, unspecified: Secondary | ICD-10-CM

## 2020-02-14 DIAGNOSIS — R05 Cough: Secondary | ICD-10-CM | POA: Insufficient documentation

## 2020-02-15 ENCOUNTER — Other Ambulatory Visit: Payer: Self-pay

## 2020-02-15 ENCOUNTER — Other Ambulatory Visit: Payer: Medicaid Other

## 2020-02-15 ENCOUNTER — Other Ambulatory Visit: Payer: Self-pay | Admitting: Gerontology

## 2020-02-15 DIAGNOSIS — R059 Cough, unspecified: Secondary | ICD-10-CM

## 2020-02-15 DIAGNOSIS — R5383 Other fatigue: Secondary | ICD-10-CM

## 2020-02-16 LAB — CBC WITH DIFFERENTIAL/PLATELET
Basophils Absolute: 0 10*3/uL (ref 0.0–0.2)
Basos: 1 %
EOS (ABSOLUTE): 0.2 10*3/uL (ref 0.0–0.4)
Eos: 2 %
Hematocrit: 40.9 % (ref 34.0–46.6)
Hemoglobin: 13.6 g/dL (ref 11.1–15.9)
Immature Grans (Abs): 0 10*3/uL (ref 0.0–0.1)
Immature Granulocytes: 0 %
Lymphocytes Absolute: 1.5 10*3/uL (ref 0.7–3.1)
Lymphs: 22 %
MCH: 29.7 pg (ref 26.6–33.0)
MCHC: 33.3 g/dL (ref 31.5–35.7)
MCV: 89 fL (ref 79–97)
Monocytes Absolute: 0.6 10*3/uL (ref 0.1–0.9)
Monocytes: 8 %
Neutrophils Absolute: 4.8 10*3/uL (ref 1.4–7.0)
Neutrophils: 67 %
Platelets: 258 10*3/uL (ref 150–450)
RBC: 4.58 x10E6/uL (ref 3.77–5.28)
RDW: 11.8 % (ref 11.7–15.4)
WBC: 7.1 10*3/uL (ref 3.4–10.8)

## 2020-02-16 LAB — COMPREHENSIVE METABOLIC PANEL
ALT: 101 IU/L — ABNORMAL HIGH (ref 0–32)
AST: 49 IU/L — ABNORMAL HIGH (ref 0–40)
Albumin/Globulin Ratio: 2.9 — ABNORMAL HIGH (ref 1.2–2.2)
Albumin: 4.6 g/dL (ref 3.8–4.8)
Alkaline Phosphatase: 65 IU/L (ref 48–121)
BUN/Creatinine Ratio: 17 (ref 9–23)
BUN: 11 mg/dL (ref 6–20)
Bilirubin Total: 0.5 mg/dL (ref 0.0–1.2)
CO2: 26 mmol/L (ref 20–29)
Calcium: 9.4 mg/dL (ref 8.7–10.2)
Chloride: 101 mmol/L (ref 96–106)
Creatinine, Ser: 0.66 mg/dL (ref 0.57–1.00)
GFR calc Af Amer: 135 mL/min/{1.73_m2} (ref >59)
GFR calc non Af Amer: 117 mL/min/{1.73_m2} (ref >59)
Globulin, Total: 1.6 g/dL (ref 1.5–4.5)
Glucose: 66 mg/dL (ref 65–99)
Potassium: 4.2 mmol/L (ref 3.5–5.2)
Sodium: 139 mmol/L (ref 134–144)
Total Protein: 6.2 g/dL (ref 6.0–8.5)

## 2020-02-16 LAB — URINALYSIS
Bilirubin, UA: NEGATIVE
Glucose, UA: NEGATIVE
Ketones, UA: NEGATIVE
Leukocytes,UA: NEGATIVE
Nitrite, UA: NEGATIVE
Protein,UA: NEGATIVE
RBC, UA: NEGATIVE
Specific Gravity, UA: 1.014 (ref 1.005–1.030)
Urobilinogen, Ur: 0.2 mg/dL (ref 0.2–1.0)
pH, UA: 5.5 (ref 5.0–7.5)

## 2020-02-16 LAB — VITAMIN D 25 HYDROXY (VIT D DEFICIENCY, FRACTURES): Vit D, 25-Hydroxy: 41.3 ng/mL (ref 30.0–100.0)

## 2020-02-22 ENCOUNTER — Telehealth: Payer: Self-pay | Admitting: Pharmacist

## 2020-02-22 NOTE — Telephone Encounter (Signed)
02/22/2020 3:01:35 PM - Patient in office reprinted form for Epi-pen  -- Rhetta Mura - Wednesday, February 22, 2020 3:00 PM --Patient in office today, she did not receive forms I mailed her for Epi-Pen-I have reprinted and Kayla getting patient to sign & date. Also letting patient know if she receives this medication at her home, she is to bring [place] the paperwork she receives with medication.

## 2020-02-29 ENCOUNTER — Ambulatory Visit
Admission: RE | Admit: 2020-02-29 | Discharge: 2020-02-29 | Disposition: A | Discharge status: Home / Self Care | Payer: Self-pay | Source: Ambulatory Visit | Attending: Gerontology | Admitting: Gerontology

## 2020-02-29 ENCOUNTER — Other Ambulatory Visit: Payer: Self-pay

## 2020-02-29 DIAGNOSIS — R05 Cough: Secondary | ICD-10-CM | POA: Insufficient documentation

## 2020-02-29 DIAGNOSIS — R059 Cough, unspecified: Secondary | ICD-10-CM

## 2020-03-06 ENCOUNTER — Encounter: Payer: Self-pay | Admitting: Gerontology

## 2020-03-06 ENCOUNTER — Ambulatory Visit: Payer: Medicaid Other | Admitting: Gerontology

## 2020-03-06 ENCOUNTER — Other Ambulatory Visit: Payer: Self-pay

## 2020-03-06 VITALS — BP 116/78 | HR 60 | Ht 61.25 in | Wt 107.9 lb

## 2020-03-06 DIAGNOSIS — R748 Abnormal levels of other serum enzymes: Secondary | ICD-10-CM

## 2020-03-06 DIAGNOSIS — N200 Calculus of kidney: Secondary | ICD-10-CM | POA: Insufficient documentation

## 2020-03-06 NOTE — Progress Notes (Signed)
Established Patient Office Visit  Subjective:  Patient ID: Kristy Hansen, female    DOB: [birth year]  Age: 34 y.o. MRN: [id number]  CC: No chief complaint on file.   HPI Kristy Hansen presents for follow up lab and imaging review. Currently, she reports that her cough has subsided and she quit smoking 1 pack of cigarette's daily. She had Chest X ray done on 02/14/2020 due to Cough and it showed vague nodular density in the left mid lung and CT was recommended. She had Chest CT done on 02/29/2020 and her Lungs are well inflated without focal airspace consolidation or effusion. There is no evidence of pulmonary nodule over the lateral left midlung as suggested on recent chest radiograph. Airways are normal per Dr Micheline Maze D. CT scan also indicated that she has 3-4 mm non obstructing left renal stone. She denies hematuria, dysuria, CVA tenderness and pelvic pain. She also states that she drinks 5 cans of Pepsi Cola and does not drink water. Her Hepatic function done on 02/15/20 , AST was 49 IU/L and ALT was 101 IU/L. She denies alcohol consumption. She takes Tylenol prn and Skelaxin 800 mg once or twice daily for Chiari malformation type 1. She denies right upper quadrant pain and jaundice. Overall, she states that she's doing well and offers no further complaint.  Past Medical History:  Diagnosis Date  . Anxiety   . Autoimmune disease ([facility])   . Chiari malformation type I ([facility])   . Hematuria, gross   . Panic attack   . Pyelonephritis   . Renal cyst     Past Surgical History:  Procedure Laterality Date  . CYST REMOVAL PEDIATRIC     Spine  . MYRINGOTOMY WITH TUBE PLACEMENT    . SPINE SURGERY      Family History  Problem Relation Age of Onset  . Cancer Mother        breast cancer, ovarian cancer  . Diabetes Mother   . Hypertension Mother   . Diabetes Maternal Grandmother   . Diabetes Maternal Uncle   . Kidney disease Neg Hx   . Bladder Cancer Neg Hx   . Prostate cancer Neg Hx      Social History   Socioeconomic History  . Marital status: Legally Separated    Spouse name: Not on file  . Number of children: Not on file  . Years of education: Not on file  . Highest education level: Not on file  Occupational History  . Not on file  Tobacco Use  . Smoking status: Former Smoker    Packs/day: 0.50    Years: 15.00    Pack years: 7.50    Types: Cigarettes  . Smokeless tobacco: Never Used  Vaping Use  . Vaping Use: Every day  Substance and Sexual Activity  . Alcohol use: Yes    Alcohol/week: 0.0 standard drinks    Comment: occas  . Drug use: No  . Sexual activity: Yes    Birth control/protection: Condom  Other Topics Concern  . Not on file  Social History Narrative  . Not on file   Social Determinants of Health   Financial Resource Strain:   . Difficulty of Paying Living Expenses: Not on file  Food Insecurity:   . Worried About Programme researcher, broadcasting/film/video in the Last Year: Not on file  . Ran Out of Food in the Last Year: Not on file  Transportation Needs:   . Lack of Transportation (Medical): Not on file  .  Lack of Transportation (Non-Medical): Not on file  Physical Activity:   . Days of Exercise per Week: Not on file  . Minutes of Exercise per Session: Not on file  Stress:   . Feeling of Stress : Not on file  Social Connections:   . Frequency of Communication with Friends and Family: Not on file  . Frequency of Social Gatherings with Friends and Family: Not on file  . Attends Religious Services: Not on file  . Active Member of Clubs or Organizations: Not on file  . Attends Banker Meetings: Not on file  . Marital Status: Not on file  Intimate Partner Violence:   . Fear of Current or Ex-Partner: Not on file  . Emotionally Abused: Not on file  . Physically Abused: Not on file  . Sexually Abused: Not on file    Outpatient Medications Prior to Visit  Medication Sig Dispense Refill  . albuterol (VENTOLIN HFA) 108 (90 Base) MCG/ACT  inhaler Inhale 2 puffs into the lungs every 6 (six) hours as needed for wheezing or shortness of breath.    Marland Kitchen FLUoxetine (PROZAC) 20 MG tablet Take 40 mg by mouth daily.     . fluticasone (FLONASE) 50 MCG/ACT nasal spray PLACE 1 SPRAY INTO BOTH NOSTRILS 2 TIMES A DAY 16 g 0  . metaxalone (SKELAXIN) 800 MG tablet Take 1 tablet (800 mg total) by mouth 3 (three) times daily. 90 tablet 3  . guaiFENesin (MUCINEX) 600 MG 12 hr tablet Take 1 tablet (600 mg total) by mouth 2 (two) times daily. 30 tablet 0  . acetaminophen (TYLENOL) 325 MG tablet Take 650 mg by mouth every 6 (six) hours as needed for headache.    . loratadine (CLARITIN) 10 MG tablet Take 1 tablet (10 mg total) by mouth daily. (Patient not taking: Reported on 02/08/2020) 30 tablet 11   No facility-administered medications prior to visit.    Allergies  Allergen Reactions  . Penicillins Anaphylaxis, Shortness Of Breath and Swelling    Has patient had a PCN reaction causing immediate rash, facial/tongue/throat swelling, SOB or lightheadedness with hypotension: Yes Has patient had a PCN reaction causing severe rash involving mucus membranes or skin necrosis: Unknown Has patient had a PCN reaction that required hospitalization: No Has patient had a PCN reaction occurring within the last 10 years: No If all of the above answers are "NO", then may proceed with Cephalosporin use.   . Chantix [Varenicline] Other (See Comments)    Nightmares  . Aspirin Hives  . Benzonatate     Hallucination  . Naprosyn [Naproxen] Hives  . Tramadol Hives    ROS Review of Systems  Constitutional: Negative.   Respiratory: Negative.   Cardiovascular: Negative.   Gastrointestinal: Negative.   Genitourinary: Negative.   Musculoskeletal: Positive for back pain (Chiari malformation type 1).  Neurological: Negative.   Psychiatric/Behavioral: Negative.       Objective:    Physical Exam HENT:     Head: Normocephalic and atraumatic.  Cardiovascular:      Rate and Rhythm: Normal rate and regular rhythm.     Pulses: Normal pulses.     Heart sounds: Normal heart sounds.  Pulmonary:     Effort: Pulmonary effort is normal.     Breath sounds: Normal breath sounds.  Abdominal:     General: Bowel sounds are normal.     Palpations: Abdomen is soft.     Tenderness: There is no right CVA tenderness or left CVA tenderness.  Musculoskeletal:  General: Normal range of motion.  Skin:    General: Skin is warm and dry.  Neurological:     General: No focal deficit present.     Mental Status: She is alert and oriented to person, place, and time. Mental status is at baseline.  Psychiatric:        Mood and Affect: Mood normal.        Behavior: Behavior normal.        Thought Content: Thought content normal.        Judgment: Judgment normal.     BP 116/78 (BP Location: Left Arm, Patient Position: Sitting)   Pulse 60   Ht 5' 1.25" (1.556 m)   Wt 107 lb 14.4 oz (48.9 kg)   LMP 02/10/2020 (Exact Date)   SpO2 100%   BMI 20.22 kg/m  Wt Readings from Last 3 Encounters:  03/06/20 107 lb 14.4 oz (48.9 kg)  02/08/20 107 lb (48.5 kg)  12/15/19 103 lb 4.8 oz (46.9 kg)     Health Maintenance Due  Topic Date Due  . Hepatitis C Screening  Never done  . COVID-19 Vaccine (1) Never done  . HIV Screening  Never done  . PAP SMEAR-Modifier  07/24/2019  . INFLUENZA VACCINE  01/08/2020    There are no preventive care reminders to display for this patient.  Lab Results  Component Value Date   TSH 1.310 11/25/2018   Lab Results  Component Value Date   WBC 7.1 02/15/2020   HGB 13.6 02/15/2020   HCT 40.9 02/15/2020   MCV 89 02/15/2020   PLT 258 02/15/2020   Lab Results  Component Value Date   NA 139 02/15/2020   K 4.2 02/15/2020   CO2 26 02/15/2020   GLUCOSE 66 02/15/2020   BUN 11 02/15/2020   CREATININE 0.66 02/15/2020   BILITOT 0.5 02/15/2020   ALKPHOS 65 02/15/2020   AST 49 (H) 02/15/2020   ALT 101 (H) 02/15/2020   PROT 6.2  02/15/2020   ALBUMIN 4.6 02/15/2020   CALCIUM 9.4 02/15/2020   ANIONGAP 8 02/12/2018   Lab Results  Component Value Date   CHOL 157 11/25/2018   Lab Results  Component Value Date   HDL 47 11/25/2018   Lab Results  Component Value Date   LDLCALC 97 11/25/2018   Lab Results  Component Value Date   TRIG 63 11/25/2018   Lab Results  Component Value Date   CHOLHDL 3.3 11/25/2018   Lab Results  Component Value Date   HGBA1C 5.0 11/25/2018      Assessment & Plan:   1. Elevated liver enzymes - Her elevated liver enzymes might be due to medication. She takes 800 mg Skelaxin once or twice daily for Chiari malformation type 1 , Tylenol and Fluoxetine 40 mg daily. She was advised to decrease Tylenol intake and takes only Skelaxin. Will recheck Hepatic enzymes and Hepatitis panel too. She was advised to go notify clinic with right upper quadrant abdominal pain or jaundice. If liver enzymes are elevated, possible Gastroenterology, Neurology or pain clinic referral. - Hepatic function panel; Future -Hepatitis Acute 2. Kidney stone on left side -Nephrolithiasis possible secondary to inadequate water intake. She was advised to decrease her consumption of Carbonated drinks and increase water intake. She should go to the ED with hematuria, fever, chills, CVA tenderness and nausea.     Follow-up: Return in about 2 months (around 05/16/2020), or if symptoms worsen or fail to improve.    Krishika Bugge Trellis Paganini, NP

## 2020-03-06 NOTE — Patient Instructions (Signed)

## 2020-03-15 ENCOUNTER — Emergency Department
Admission: EM | Admit: 2020-03-15 | Discharge: 2020-03-15 | Disposition: A | Discharge status: Home / Self Care | Payer: Medicaid Other | Attending: Emergency Medicine | Admitting: Emergency Medicine

## 2020-03-15 DIAGNOSIS — Z5321 Procedure and treatment not carried out due to patient leaving prior to being seen by health care provider: Secondary | ICD-10-CM | POA: Insufficient documentation

## 2020-03-15 DIAGNOSIS — N200 Calculus of kidney: Secondary | ICD-10-CM | POA: Insufficient documentation

## 2020-03-15 NOTE — ED Triage Notes (Signed)
Pt to the er for kidney stone. Pt says she has pain this am but has greatly improved. Pt was dx with stone weeks ago. Pt states she tried to go to PCP but they sent her here. Pt describes pain this am as obvious kidney stone but only a dull ache now. Pt states she wants to go home and will follow up with pcp. Advised pt to come back if pain returned, became febrile or was unable to urinate.

## 2020-04-05 ENCOUNTER — Telehealth: Payer: Self-pay | Admitting: Pharmacist

## 2020-04-05 NOTE — Telephone Encounter (Signed)
04/05/2020 10:44:24 AM - Pro Air HFA to patient & Dr  -- Rhetta Mura - Thursday, April 05, 2020 10:42 AM --Patient was previously on Ventolin HFA--No refills per [employer]/Michelle, and no longer able to submit script per [place]. We are changing patients to [employer] HFA, printed [employer] application-sending provider portion to [facility] & mailing patient her portion to sign & return.

## 2020-04-17 ENCOUNTER — Telehealth: Payer: Self-pay | Admitting: Pharmacist

## 2020-04-17 NOTE — Telephone Encounter (Signed)
04/17/2020 3:12:33 PM - ProAir HFA faxed to [employer]  -- Rhetta Mura - Tuesday, April 17, 2020 3:11 PM --Faxed [employer] application for ProAir HFA Inhale 1-2 puffs into the lungs every 6 hours as needed for wheezing or shortness of breath #3, (replaces Ventolin HFA).

## 2020-04-25 ENCOUNTER — Telehealth: Payer: Self-pay | Admitting: Pharmacist

## 2020-04-25 NOTE — Telephone Encounter (Signed)
04/25/2020 9:19:38 AM - Epipen application faxed to [employer]  -- Rhetta Mura - Wednesday, April 25, 2020 9:17 AM --Orbie Hurst application for EpiPen 0.3mg  (2 pak) # 3, requested to ship to our office.

## 2020-05-09 ENCOUNTER — Other Ambulatory Visit: Payer: Medicaid Other

## 2020-05-09 ENCOUNTER — Other Ambulatory Visit: Payer: Self-pay

## 2020-05-09 DIAGNOSIS — R748 Abnormal levels of other serum enzymes: Secondary | ICD-10-CM

## 2020-05-10 LAB — HEPATIC FUNCTION PANEL
ALT: 15 IU/L (ref 0–32)
AST: 14 IU/L (ref 0–40)
Albumin: 4.6 g/dL (ref 3.8–4.8)
Alkaline Phosphatase: 43 IU/L — ABNORMAL LOW (ref 44–121)
Bilirubin Total: 0.8 mg/dL (ref 0.0–1.2)
Bilirubin, Direct: 0.22 mg/dL (ref 0.00–0.40)
Total Protein: 6.5 g/dL (ref 6.0–8.5)

## 2020-05-10 LAB — HEPATITIS PANEL, ACUTE
Hep A IgM: NEGATIVE
Hep B C IgM: NEGATIVE
Hep C Virus Ab: <0.1 s/co ratio (ref 0.0–0.9)
Hepatitis B Surface Ag: NEGATIVE

## 2020-05-17 ENCOUNTER — Ambulatory Visit: Payer: Medicaid Other | Admitting: Gerontology

## 2020-05-17 ENCOUNTER — Other Ambulatory Visit: Payer: Self-pay

## 2020-05-17 ENCOUNTER — Encounter: Payer: Self-pay | Admitting: Gerontology

## 2020-05-17 ENCOUNTER — Other Ambulatory Visit: Payer: Self-pay | Admitting: Gerontology

## 2020-05-17 VITALS — BP 115/79 | HR 80

## 2020-05-17 DIAGNOSIS — M549 Dorsalgia, unspecified: Secondary | ICD-10-CM

## 2020-05-17 MED ORDER — METAXALONE 800 MG PO TABS: 800.0000 mg | ORAL_TABLET | Freq: Three times a day (TID) | ORAL | 3 refills | Status: DC

## 2020-05-17 NOTE — Progress Notes (Signed)
Established Patient Office Visit  Subjective:  Patient ID: Kristy Hansen, female    DOB: 17-Jun-1985  Age: 34 y.o. MRN: [id number]  CC:  Chief Complaint  Patient presents with  . Follow-up    Lab results    HPI Kristy Hansen presents for lab review and medication refill. She states that she's compliant with her medications, denies side effects and continues to make healthy lifestyle changes. Her Liver enzymes and Hepatitis test done on 05/09/2020 were within normal limits. She reports taking her Metaxalone for her Chiari malformation type 1 with relief. She states that she vapes and admits the desire to quit. She states that she self discontinued taking her Prozac due to elevated liver enzyme prior to 05/09/2020 lab test. She states that she will notify her Therapist, states that her mood is good, denies suicidal nor homicidal ideation. Overall, she states that she's doing well and offers no further complaint.  Past Medical History:  Diagnosis Date  . Anxiety   . Autoimmune disease (HCC)   . Chiari malformation type I (HCC)   . Hematuria, gross   . Panic attack   . Pyelonephritis   . Renal cyst     Past Surgical History:  Procedure Laterality Date  . CYST REMOVAL PEDIATRIC     Spine  . MYRINGOTOMY WITH TUBE PLACEMENT    . SPINE SURGERY      Family History  Problem Relation Age of Onset  . Cancer Mother        breast cancer, ovarian cancer  . Diabetes Mother   . Hypertension Mother   . Diabetes Maternal Grandmother   . Diabetes Maternal Uncle   . Kidney disease Neg Hx   . Bladder Cancer Neg Hx   . Prostate cancer Neg Hx     Social History   Socioeconomic History  . Marital status: Legally Separated    Spouse name: Not on file  . Number of children: Not on file  . Years of education: Not on file  . Highest education level: Not on file  Occupational History  . Not on file  Tobacco Use  . Smoking status: Former Smoker    Packs/day: 0.50    Years: 15.00     Pack years: 7.50    Types: Cigarettes  . Smokeless tobacco: Never Used  Vaping Use  . Vaping Use: Every day  Substance and Sexual Activity  . Alcohol use: Yes    Alcohol/week: 0.0 standard drinks    Comment: ocassionally  . Drug use: No  . Sexual activity: Yes    Birth control/protection: Condom  Other Topics Concern  . Not on file  Social History Narrative  . Not on file   Social Determinants of Health   Financial Resource Strain: Not on file  Food Insecurity: Not on file  Transportation Needs: Not on file  Physical Activity: Not on file  Stress: Not on file  Social Connections: Not on file  Intimate Partner Violence: Not on file    Outpatient Medications Prior to Visit  Medication Sig Dispense Refill  . acetaminophen (TYLENOL) 325 MG tablet Take 650 mg by mouth every 6 (six) hours as needed for headache.    . albuterol (VENTOLIN HFA) 108 (90 Base) MCG/ACT inhaler Inhale 2 puffs into the lungs every 6 (six) hours as needed for wheezing or shortness of breath.    . metaxalone (SKELAXIN) 800 MG tablet Take 1 tablet (800 mg total) by mouth 3 (three) times daily. 90  tablet 3  . FLUoxetine (PROZAC) 20 MG tablet Take 40 mg by mouth daily.  (Patient not taking: Reported on 05/17/2020)    . fluticasone (FLONASE) 50 MCG/ACT nasal spray PLACE 1 SPRAY INTO BOTH NOSTRILS 2 TIMES A DAY (Patient not taking: Reported on 05/17/2020) 16 g 0   No facility-administered medications prior to visit.    Allergies  Allergen Reactions  . Penicillins Anaphylaxis, Shortness Of Breath and Swelling    Has patient had a PCN reaction causing immediate rash, facial/tongue/throat swelling, SOB or lightheadedness with hypotension: Yes Has patient had a PCN reaction causing severe rash involving mucus membranes or skin necrosis: Unknown Has patient had a PCN reaction that required hospitalization: No Has patient had a PCN reaction occurring within the last 10 years: No If all of the above answers are  "NO", then may proceed with Cephalosporin use.   . Chantix [Varenicline] Other (See Comments)    Nightmares  . Aspirin Hives  . Benzonatate     Hallucination  . Naprosyn [Naproxen] Hives  . Tramadol Hives    ROS Review of Systems  Constitutional: Negative.   Respiratory: Negative.   Cardiovascular: Negative.   Gastrointestinal: Negative.   Neurological: Negative.   Psychiatric/Behavioral: Negative.       Objective:    Physical Exam HENT:     Head: Normocephalic and atraumatic.  Cardiovascular:     Rate and Rhythm: Normal rate and regular rhythm.     Pulses: Normal pulses.     Heart sounds: Normal heart sounds.  Pulmonary:     Effort: Pulmonary effort is normal.     Breath sounds: Normal breath sounds.  Neurological:     General: No focal deficit present.     Mental Status: She is alert and oriented to person, place, and time. Mental status is at baseline.  Psychiatric:        Mood and Affect: Mood normal.        Behavior: Behavior normal.        Thought Content: Thought content normal.        Judgment: Judgment normal.     BP 115/79 (BP Location: Left Arm, Patient Position: Sitting, Cuff Size: Small)   Pulse 80   SpO2 99%  Wt Readings from Last 3 Encounters:  03/06/20 107 lb 14.4 oz (48.9 kg)  02/08/20 107 lb (48.5 kg)  12/15/19 103 lb 4.8 oz (46.9 kg)     Health Maintenance Due  Topic Date Due  . COVID-19 Vaccine (1) Never done  . HIV Screening  Never done  . PAP SMEAR-Modifier  07/24/2019  . INFLUENZA VACCINE  01/08/2020    There are no preventive care reminders to display for this patient.  Lab Results  Component Value Date   TSH 1.310 11/25/2018   Lab Results  Component Value Date   WBC 7.1 02/15/2020   HGB 13.6 02/15/2020   HCT 40.9 02/15/2020   MCV 89 02/15/2020   PLT 258 02/15/2020   Lab Results  Component Value Date   NA 139 02/15/2020   K 4.2 02/15/2020   CO2 26 02/15/2020   GLUCOSE 66 02/15/2020   BUN 11 02/15/2020    CREATININE 0.66 02/15/2020   BILITOT 0.8 05/09/2020   ALKPHOS 43 (L) 05/09/2020   AST 14 05/09/2020   ALT 15 05/09/2020   PROT 6.5 05/09/2020   ALBUMIN 4.6 05/09/2020   CALCIUM 9.4 02/15/2020   ANIONGAP 8 02/12/2018   Lab Results  Component Value Date  CHOL 157 11/25/2018   Lab Results  Component Value Date   HDL 47 11/25/2018   Lab Results  Component Value Date   LDLCALC 97 11/25/2018   Lab Results  Component Value Date   TRIG 63 11/25/2018   Lab Results  Component Value Date   CHOLHDL 3.3 11/25/2018   Lab Results  Component Value Date   HGBA1C 5.0 11/25/2018      Assessment & Plan:    1. Back pain, unspecified back location, unspecified back pain laterality, unspecified chronicity - Her back pain is controlled and she will continue on current treatment regimen. - metaxalone (SKELAXIN) 800 MG tablet; Take 1 tablet (800 mg total) by mouth 3 (three) times daily.  Dispense: 90 tablet; Refill: 3    Follow-up: Return in about 6 months (around 11/15/2020), or if symptoms worsen or fail to improve.    Mikenzi Raysor Trellis Paganini, NP

## 2020-05-17 NOTE — Patient Instructions (Signed)
Electronic Cigarette Information  Electronic cigarettes, or e-cigarettes, are battery-operated devices that deliver nicotine--a very addictive drug--to the body. They come in many shapes, including in the shape of a cigarette, pipe, pen, and even a USB memory stick. E-cigarettes have a cartridge that contains a liquid form of nicotine. When a person uses the device, the liquid heats up. It then becomes a vapor. Inhaling this vapor is called vaping. Nicotine is thought to increase your risk for certain types of cancer. In addition to nicotine, e-cigarettes may contain other harmful and cancer-causing chemicals, including:  Formaldehyde.  Acetaldehyde.  Heavy metals.  Ultra-fine particles that can get inhaled deep into the lungs.  Chemical colorings and flavorings. It is not clear how much nicotine you get when vaping, and it is hard to know what chemicals are in the vaping liquids. The health effects of vaping are not completely known, but you should be aware of the possible dangers of using these products. Some people may use e-cigarettes in order to quit smoking tobacco. However, this has not been proven to work, and the Food and Drug Administration (FDA) has not approved e-cigarettes for this purpose. How can using electronic cigarettes affect me?  You may be at risk for developing a dangerous lung disease. There are reports of an increasing number of cases involving serious lung problems, and even death, associated with e-cigarette use. Your risk may be even higher if you: ? Buy e-cigarettes or vaping oils off the street. ? Add any substances to the e-cigarettes that are not intended by the manufacturer.  Vaping may make you crave nicotine. Nicotine does the following: ? Changes your blood sugar levels. ? Increases your heart rate, blood pressure, and breathing rate. ? Increases your risk of developing blood clots (hypercoagulable state) and diabetes. ? Increases your risk of gum disease  that may lead to losing teeth.  If you smoke e-cigarettes, you may be more likely to start smoking or to smoke more tobacco cigarettes.  Becoming addicted to nicotine may make your brain more sensitive to other addictive drugs. You may move to other addictive substances.  You may be in danger of overdosing on nicotine. Nicotine poisoning can cause nausea, vomiting, seizures, and trouble breathing.  An e-cigarette may explode and cause fires and burn injuries.  If you are pregnant, the nicotine in e-cigarettes may be harmful to your baby. Nicotine can cause: ? Brain or lung problems for your baby. ? Your baby to be born too early. ? Your baby to be born with a low birth weight.  Vaping has also been linked to decreases in memory and attention span in children and teens. What actions can I take to stop vaping? If you can, stop vaping on your own before you become addicted to nicotine. If you need help quitting, ask your health care provider. There are three effective ways to fight nicotine addiction:  Nicotine replacement therapy. Using nicotine gum or a nicotine patch blocks your craving for nicotine. Over time, you can reduce the amount of nicotine you use until you can stop using nicotine completely without having cravings.  Prescription medicines approved to fight nicotine addiction. These stop nicotine cravings or block the effects of nicotine.  Behavioral therapy. This may include: ? A self-help smoking cessation program. ? Individual or group therapy. ? A smoking cessation support group. There are several national programs to help you quit smoking or vaping. These include:  Text message programs, such as SmokefreeTXT.  Apps for mobile phones,   including the free quitSTART app.  Hotlines, such as 1-800-QUIT-NOW (1-800-784-8669). Where to find support You can get support at these sites:  [place] Department of Health and Human Services: https://smokefree.gov  American Lung  Association: www.lung.org Where to find more information Learn more about e-cigarettes from:  National Institute on Drug Abuse: www.drugabuse.gov  Centers for Disease Control and Prevention: www.cdc.gov Summary  E-cigarettes can cause nicotine addiction.  E-cigarettes are not approved as a way to stop smoking. They are not a risk-free alternative to smoking tobacco.  There are reports of an increasing number of cases involving serious lung problems, and even death, associated with e-cigarette use.  If you can stop vaping on your own, do it before you become addicted to nicotine. If you need help quitting, ask your health care provider.  There are various methods and programs that can help you stop smoking or vaping. This information is not intended to replace advice given to you by your health care provider. Make sure you discuss any questions you have with your health care provider. Document Revised: 09/21/2018 Document Reviewed: 08/06/2018 Elsevier Patient Education  2020 Elsevier Inc.  

## 2020-06-12 ENCOUNTER — Emergency Department
Admission: EM | Admit: 2020-06-12 | Discharge: 2020-06-12 | Discharge status: Against Medical Advice | Payer: Medicaid Other

## 2020-06-18 ENCOUNTER — Telehealth: Payer: Self-pay

## 2020-06-18 NOTE — Telephone Encounter (Signed)
I have scheduled patient to come in to see Dr. Logan Bores tomorrow.

## 2020-06-18 NOTE — Telephone Encounter (Signed)
Patient called in stating that she was diagnosed with herpes 3 years ago and is now having an outbreak. Patient hasn't been seen in 2 years- a Dr. D patient. I am aware of the schedule for today, how would you like to proceed with this patient?

## 2020-06-19 ENCOUNTER — Other Ambulatory Visit (HOSPITAL_COMMUNITY)
Admission: RE | Admit: 2020-06-19 | Discharge: 2020-06-19 | Disposition: A | Discharge status: Home / Self Care | Payer: Medicaid Other | Source: Ambulatory Visit | Attending: Obstetrics and Gynecology | Admitting: Obstetrics and Gynecology

## 2020-06-19 ENCOUNTER — Ambulatory Visit (INDEPENDENT_AMBULATORY_CARE_PROVIDER_SITE_OTHER): Payer: Medicaid Other | Admitting: Obstetrics and Gynecology

## 2020-06-19 ENCOUNTER — Other Ambulatory Visit: Payer: Self-pay

## 2020-06-19 ENCOUNTER — Encounter: Payer: Self-pay | Admitting: Obstetrics and Gynecology

## 2020-06-19 VITALS — BP 125/81 | HR 72 | Ht 61.25 in | Wt 112.5 lb

## 2020-06-19 DIAGNOSIS — Z711 Person with feared health complaint in whom no diagnosis is made: Secondary | ICD-10-CM | POA: Diagnosis present

## 2020-06-19 NOTE — Progress Notes (Signed)
HPI:      [name] is a 35 y.o. [id number] who LMP was Patient's last menstrual period was 06/13/2020.  Subjective:   She presents today stating that she has noticed a new "bump" that is irritated just inside her vagina.  She states that 3 years ago she was tested for herpes and she is "not sure of" the results.  She is concerned this could be herpes.  She does state that she has significant overall anxiety/OCD regarding cleanliness and STDs. Patient denies new sexual partner.  Denies vaginal discharge itching or burning. (She has had yeast infections before and states this is not like that)    Hx: The following portions of the patient's history were reviewed and updated as appropriate:             She  has a past medical history of Anxiety, Autoimmune disease (HCC), Chiari malformation type I (HCC), Hematuria, gross, Panic attack, Pyelonephritis, and Renal cyst. She does not have any pertinent problems on file. She  has a past surgical history that includes Myringotomy with tube placement; Cyst removal pediatric; and Spine surgery. Her family history includes Cancer in her mother; Diabetes in her maternal grandmother, maternal uncle, and mother; Hypertension in her mother. She  reports that she has quit smoking. Her smoking use included cigarettes. She has a 7.50 pack-year smoking history. She has never used smokeless tobacco. She reports current alcohol use. She reports that she does not use drugs. She has a current medication list which includes the following prescription(s): acetaminophen, metaxalone, albuterol, and fluoxetine. She is allergic to penicillins, chantix [varenicline], aspirin, benzonatate, naprosyn [naproxen], and tramadol.       Review of Systems:  Review of Systems  Constitutional: Denied constitutional symptoms, night sweats, recent illness, fatigue, fever, insomnia and weight loss.  Eyes: Denied eye symptoms, eye pain, photophobia, vision change and visual  disturbance.  Ears/Nose/Throat/Neck: Denied ear, nose, throat or neck symptoms, hearing loss, nasal discharge, sinus congestion and sore throat.  Cardiovascular: Denied cardiovascular symptoms, arrhythmia, chest pain/pressure, edema, exercise intolerance, orthopnea and palpitations.  Respiratory: Denied pulmonary symptoms, asthma, pleuritic pain, productive sputum, cough, dyspnea and wheezing.  Gastrointestinal: Denied, gastro-esophageal reflux, melena, nausea and vomiting.  Genitourinary: See HPI for additional information.  Musculoskeletal: Denied musculoskeletal symptoms, stiffness, swelling, muscle weakness and myalgia.  Dermatologic: Denied dermatology symptoms, rash and scar.  Neurologic: Denied neurology symptoms, dizziness, headache, neck pain and syncope.  Psychiatric: Denied psychiatric symptoms, anxiety and depression.  Endocrine: Denied endocrine symptoms including hot flashes and night sweats.   Meds:   Current Outpatient Medications on File Prior to Visit  Medication Sig Dispense Refill  . acetaminophen (TYLENOL) 325 MG tablet Take 650 mg by mouth every 6 (six) hours as needed for headache.    . metaxalone (SKELAXIN) 800 MG tablet Take 1 tablet (800 mg total) by mouth 3 (three) times daily. 90 tablet 3  . albuterol (VENTOLIN HFA) 108 (90 Base) MCG/ACT inhaler Inhale 2 puffs into the lungs every 6 (six) hours as needed for wheezing or shortness of breath. (Patient not taking: Reported on 06/19/2020)    . FLUoxetine (PROZAC) 20 MG tablet Take 40 mg by mouth daily.  (Patient not taking: No sig reported)     No current facility-administered medications on file prior to visit.          Objective:     Vitals:   06/19/20 0931  BP: 125/81  Pulse: 72   Filed Weights   06/19/20  0931  Weight: 112 lb 8 oz (51 kg)              Physical examination   Pelvic:   Vulva: Normal appearance.  No lesions.  Vagina: No lesions or abnormalities noted.  Support: Normal pelvic  support.  Urethra No masses tenderness or scarring.  Meatus Normal size without lesions or prolapse.  Cervix: Normal appearance.  No lesions.  Anus: Normal exam.  No lesions.  Perineum: Normal exam.  No lesions.        Bimanual   Uterus: Normal size.  Non-tender.  Mobile.  AV.  Adnexae: No masses.  Non-tender to palpation.  Cul-de-sac: Negative for abnormality.     Assessment:    [id number] Patient Active Problem List   Diagnosis Date Noted  . Elevated liver enzymes 03/06/2020  . Kidney stone on left side 03/06/2020  . Blurry vision, bilateral 02/08/2020  . Fatigue 02/08/2020  . Cough 12/15/2019  . Chiari malformation type I (HCC) 10/20/2017  . Galactorrhea 06/19/2016  . Irregular menses 06/19/2016  . Renal cyst 01/05/2015  . Paresthesias 12/12/2014  . Family history of ovarian cancer 12/12/2014  . Tobacco user 12/12/2014  . Renal mass 12/05/2014  . Anxiety 10/11/2014  . Back pain, chronic 07/17/2014     1. Concern about STD in female without diagnosis     Patient has a completely normal examination-no evidence of herpes or other vaginal lesions noted.  Possibly one of her hymeneal tags irritated which she interprets as a new bump.   Plan:            1.  Reassured patient-does not appear as if herpes.  No other abnormalities noted on exam  2.  At patient request Nuswab performed.  3.  Declined HSV testing at this time Orders No orders of the defined types were placed in this encounter.   No orders of the defined types were placed in this encounter.     F/U  Return for We will contact her with any abnormal test results, Annual Physical. I spent 24 minutes involved in the care of this patient preparing to see the patient by obtaining and reviewing her medical history (including labs, imaging tests and prior procedures), documenting clinical information in the electronic health record (EHR), counseling and coordinating care plans, writing and sending prescriptions,  ordering tests or procedures and directly communicating with the patient by discussing pertinent items from her history and physical exam as well as detailing my assessment and plan as noted above so that she has an informed understanding.  All of her questions were answered.  Elonda Husky, M.D. 06/19/2020 10:30 AM

## 2020-06-19 NOTE — Addendum Note (Signed)
Addended by: Dorian Pod on: 06/19/2020 11:14 AM   Modules accepted: Orders

## 2020-06-20 LAB — CERVICOVAGINAL ANCILLARY ONLY
Bacterial Vaginitis (gardnerella): NEGATIVE
Candida Glabrata: NEGATIVE
Candida Vaginitis: NEGATIVE
Chlamydia: NEGATIVE
Comment: NEGATIVE
Comment: NEGATIVE
Comment: NEGATIVE
Comment: NEGATIVE
Comment: NEGATIVE
Comment: NORMAL
Neisseria Gonorrhea: NEGATIVE
Trichomonas: NEGATIVE

## 2020-07-19 ENCOUNTER — Other Ambulatory Visit: Payer: Self-pay

## 2020-07-19 ENCOUNTER — Ambulatory Visit: Payer: Medicaid Other | Admitting: Gerontology

## 2020-07-19 ENCOUNTER — Encounter: Payer: Self-pay | Admitting: Gerontology

## 2020-07-19 VITALS — BP 129/77 | HR 64 | Temp 97.2°F | Resp 16 | Wt 114.2 lb

## 2020-07-19 DIAGNOSIS — J069 Acute upper respiratory infection, unspecified: Secondary | ICD-10-CM | POA: Insufficient documentation

## 2020-07-19 DIAGNOSIS — B9689 Other specified bacterial agents as the cause of diseases classified elsewhere: Secondary | ICD-10-CM | POA: Insufficient documentation

## 2020-07-19 MED ORDER — AZITHROMYCIN 250 MG PO TABS: ORAL_TABLET | ORAL | 0 refills | Status: DC

## 2020-07-19 NOTE — Progress Notes (Signed)
Established Patient Office Visit  Subjective:  Patient ID: Kristy Hansen, female    DOB: Nov 04, 1985  Age: 35 y.o. MRN: [id number]  CC: Cough and congestion  HPI Kristy Hansen is a 35 year old female that presents for reported cough, congestion, and headache. This started 6 days ago and has progressively worsened. She did test negative for COVID-19 on Tuesday. She reports her cough is dry and "croupy". Her chest feels like "it's on fire". She denies any wheezing. She does report some mild SOB relieved with her albuterol inhaler. She reports green nasal discharge and congestion. She has not checked her temperature at home but feels as though she has had a fever at times and has experienced chills. She also reports a headache that is aching and a sore throat. She has tried robatussin, sudafed, ibuprofen, and another medication she does not remember the name of with minimal relief. The cough is persistent throughout the day. She does report her symptoms are fine when she lays down at night. She rates her severity of symptoms as 8/10.   Past Medical History:  Diagnosis Date  . Anxiety   . Autoimmune disease (HCC)   . Chiari malformation type I (HCC)   . Hematuria, gross   . Panic attack   . Pyelonephritis   . Renal cyst     Past Surgical History:  Procedure Laterality Date  . CYST REMOVAL PEDIATRIC     Spine  . MYRINGOTOMY WITH TUBE PLACEMENT    . SPINE SURGERY      Family History  Problem Relation Age of Onset  . Cancer Mother        breast cancer, ovarian cancer  . Diabetes Mother   . Hypertension Mother   . Diabetes Maternal Grandmother   . Diabetes Maternal Uncle   . Kidney disease Neg Hx   . Bladder Cancer Neg Hx   . Prostate cancer Neg Hx     Social History   Socioeconomic History  . Marital status: Legally Separated    Spouse name: Not on file  . Number of children: Not on file  . Years of education: Not on file  . Highest education level: Not on file   Occupational History  . Not on file  Tobacco Use  . Smoking status: Former Smoker    Packs/day: 0.50    Years: 15.00    Pack years: 7.50    Types: Cigarettes  . Smokeless tobacco: Never Used  Vaping Use  . Vaping Use: Every day  Substance and Sexual Activity  . Alcohol use: Yes    Alcohol/week: 0.0 standard drinks    Comment: ocassionally  . Drug use: No  . Sexual activity: Yes    Birth control/protection: Condom  Other Topics Concern  . Not on file  Social History Narrative  . Not on file   Social Determinants of Health   Financial Resource Strain: Not on file  Food Insecurity: Not on file  Transportation Needs: Not on file  Physical Activity: Not on file  Stress: Not on file  Social Connections: Not on file  Intimate Partner Violence: Not on file    Outpatient Medications Prior to Visit  Medication Sig Dispense Refill  . acetaminophen (TYLENOL) 325 MG tablet Take 650 mg by mouth every 6 (six) hours as needed for headache.    . albuterol (VENTOLIN HFA) 108 (90 Base) MCG/ACT inhaler Inhale 2 puffs into the lungs every 6 (six) hours as needed for wheezing or  shortness of breath. (Patient not taking: Reported on 06/19/2020)    . FLUoxetine (PROZAC) 20 MG tablet Take 40 mg by mouth daily.  (Patient not taking: No sig reported)    . metaxalone (SKELAXIN) 800 MG tablet Take 1 tablet (800 mg total) by mouth 3 (three) times daily. 90 tablet 3   No facility-administered medications prior to visit.    Allergies  Allergen Reactions  . Penicillins Anaphylaxis, Shortness Of Breath and Swelling    Has patient had a PCN reaction causing immediate rash, facial/tongue/throat swelling, SOB or lightheadedness with hypotension: Yes Has patient had a PCN reaction causing severe rash involving mucus membranes or skin necrosis: Unknown Has patient had a PCN reaction that required hospitalization: No Has patient had a PCN reaction occurring within the last 10 years: No If all of the  above answers are "NO", then may proceed with Cephalosporin use.   . Chantix [Varenicline] Other (See Comments)    Nightmares  . Aspirin Hives  . Benzonatate     Hallucination  . Naprosyn [Naproxen] Hives  . Tramadol Hives    ROS Review of Systems  Constitutional: Positive for chills and fatigue. Negative for fever.  HENT: Positive for congestion, postnasal drip, rhinorrhea, sinus pressure, sinus pain and sore throat. Negative for ear pain.   Eyes: Negative for discharge and redness.  Respiratory: Positive for cough, chest tightness and shortness of breath. Negative for wheezing.   Cardiovascular: Negative for chest pain and palpitations.  Gastrointestinal: Positive for diarrhea. Negative for blood in stool and nausea.  Genitourinary: Negative.   Musculoskeletal: Positive for myalgias.  Skin: Negative.   Neurological: Negative.   Psychiatric/Behavioral: Negative.       Objective:    Physical Exam Constitutional:      Appearance: Normal appearance. She is not toxic-appearing.  HENT:     Right Ear: Tympanic membrane normal.     Left Ear: Tympanic membrane normal.     Nose: Congestion and rhinorrhea (clear) present. Rhinorrhea is clear.     Right Turbinates: Swollen.     Left Turbinates: Swollen.     Right Sinus: Frontal sinus tenderness present.     Left Sinus: Frontal sinus tenderness present.     Mouth/Throat:     Mouth: Mucous membranes are moist.     Pharynx: Posterior oropharyngeal erythema present. No pharyngeal swelling.     Tonsils: No tonsillar exudate. 0 on the right. 0 on the left.  Eyes:     Conjunctiva/sclera: Conjunctivae normal.  Cardiovascular:     Rate and Rhythm: Normal rate and regular rhythm.     Heart sounds: Normal heart sounds.  Pulmonary:     Effort: Pulmonary effort is normal.     Breath sounds: Normal breath sounds.  Abdominal:     General: Bowel sounds are normal.     Palpations: Abdomen is soft.     Tenderness: There is no abdominal  tenderness.  Skin:    General: Skin is warm and dry.  Neurological:     Mental Status: She is oriented to person, place, and time.  Psychiatric:        Mood and Affect: Mood normal.        Behavior: Behavior normal.     There were no vitals taken for this visit. Wt Readings from Last 3 Encounters:  06/19/20 112 lb 8 oz (51 kg)  03/06/20 107 lb 14.4 oz (48.9 kg)  02/08/20 107 lb (48.5 kg)     Health Maintenance Due  Topic Date Due  . COVID-19 Vaccine (1) Never done  . HIV Screening  Never done  . PAP SMEAR-Modifier  07/24/2019  . INFLUENZA VACCINE  01/08/2020    There are no preventive care reminders to display for this patient.  Lab Results  Component Value Date   TSH 1.310 11/25/2018   Lab Results  Component Value Date   WBC 7.1 02/15/2020   HGB 13.6 02/15/2020   HCT 40.9 02/15/2020   MCV 89 02/15/2020   PLT 258 02/15/2020   Lab Results  Component Value Date   NA 139 02/15/2020   K 4.2 02/15/2020   CO2 26 02/15/2020   GLUCOSE 66 02/15/2020   BUN 11 02/15/2020   CREATININE 0.66 02/15/2020   BILITOT 0.8 05/09/2020   ALKPHOS 43 (L) 05/09/2020   AST 14 05/09/2020   ALT 15 05/09/2020   PROT 6.5 05/09/2020   ALBUMIN 4.6 05/09/2020   CALCIUM 9.4 02/15/2020   ANIONGAP 8 02/12/2018   Lab Results  Component Value Date   CHOL 157 11/25/2018   Lab Results  Component Value Date   HDL 47 11/25/2018   Lab Results  Component Value Date   LDLCALC 97 11/25/2018   Lab Results  Component Value Date   TRIG 63 11/25/2018   Lab Results  Component Value Date   CHOLHDL 3.3 11/25/2018   Lab Results  Component Value Date   HGBA1C 5.0 11/25/2018      Assessment & Plan:   1. Acute bacterial sinusitis Continue supportive care measures  Due to length of symptoms and follow up concerns, will treat with abx therapy. Notify if no improvement of symptoms within 48 hours of abx therapy initiation - azithromycin (ZITHROMAX) 250 MG tablet; Z pack  Dispense: 6  tablet; Refill: 0  2. Upper respiratory infection, viral COVID-19 test negative - no known contact with sick persons. Continue supportive care measures.  Deep breathing exercises  If difficulty breathing occurs, go to the ED. Notify if symptoms worsen, you develop a persistent fever, or your SOB is not relieved with albuterol inhaler.    Follow-up: Return in 3 weeks (08/09/20), or if symptoms worsen or do not improve.    Noemi Chapel, RN, BSN, FNP-S

## 2020-07-19 NOTE — Patient Instructions (Signed)
Antibiotic Medicine, Adult  Antibiotic medicines treat infections caused by a type of germ called bacteria. These medicines work by killing the bacteria that make you sick. You should take antibiotic medicines safely and only when needed. When do I need to take antibiotics? You may need antibiotics for:  A urinary tract infection (UTI).  Strep throat.  A sinus infection caused by bacteria.  Meningitis. This affects the spinal cord and brain.  A bad lung infection. You may start your medicines while your doctor waits for your results on some tests. When your results come back, your doctor may change or stop your medicine based on your test results. When are antibiotics not needed? You do not need these medicines for most common illnesses, such as:  A cold.  The flu.  A sore throat.  Mucus being an odd color.  Bronchitis. Sometimes, antibiotics are not needed for an infection caused by bacteria. Do not ask for these medicines, or take them, when they are not needed. How long should I take my antibiotic? You need to take all your medicine. Take your antibiotic medicine as told by your doctor. Do not stop taking the antibiotic even if you start to feel better. If you stop taking it too soon:  You may feel sick again.  Your infection may get harder to treat. Antibiotics need different amounts of time to work. Some treatments last just a few days. Some last about a week to 10 days. Sometimes, you may need to take antibiotics for a few weeks to fully treat your infection. What if I miss a dose? Try not to miss a dose. If you miss a dose, call your doctor or pharmacist. Sometimes, it is okay to take the missed dose as soon as you can. Do not take an extra dose. What are the risks of taking antibiotics? Antibiotics can cause:  Allergic attacks.  A feeling like you may vomit (nausea).  Yeast infections.  Liver problems. These medicines can cause an infection called C. diff. This  causes watery poop (diarrhea). This happens when antibiotics kill good germs in your gut. This lets C. diff grow. Tell your doctor right away if:  You get watery poop while taking your antibiotic.  You get watery poop after you stop your antibiotic. C. diff can happen weeks after you stop your medicine. You also have a risk of getting an infection in the future that antibiotics cannot treat (antibiotic-resistant infection). These infections can get very bad. Sometimes, they can be life-threatening. Do antibiotics affect birth control? Birth control pills may not work. If you take birth control pills:  Keep taking them as normal.  Use a second form of birth control, such as a condom. Do this for as long as told by your doctor. What else should I know about taking antibiotics? You need to take these medicines exactly as told. Make sure to do these things:  Take the right amount of medicine at the same time each day.  Ask your doctor: ? How long to wait between doses. ? If you should take your medicine with food. ? If you should stay away from some foods, drinks, or medicines. ? What side effects you should watch for.  Use only the medicines that your doctor said to use. Do not use medicines that were given to someone else.  Drink a large glass of water when you take your medicine. Drink enough fluid to keep your pee (urine) pale yellow.  Ask your pharmacist for  a tool to measure your medicine. This may be a syringe, cup, or spoon.  Throw out any extra medicine.   Follow these instructions at home:  Take over-the-counter and prescription medicines as told by your doctor.  Return to your normal activities as told by your doctor. Ask your doctor what activities are safe for you.  Keep all follow-up visits as told by your doctor. This is important. Contact a doctor if:  You feel worse.  You have one of these after you start your medicine: ? New joint pain. ? New muscle  aches.  You have side effects from your medicine, such as: ? Stomach pain. ? Watery poop. ? Feeling like you may vomit. ? White patches in your mouth or throat. Get help right away if:  You have a very bad allergic attack. If this happens, stop taking your medicine right away. You may get: ? Hives. These are raised, itchy, red bumps on your skin. ? Skin rash. ? Trouble breathing. ? Breathing that has whistling sounds. ? Swelling on your body. ? A dizzy feeling. ? Vomiting.  You have symptoms of liver problems. You may have: ? Dark pee, or pee that is the color of blood. ? Yellow skin. ? Easy bruising. ? Easy bleeding.  You have very bad watery poop.  You have cramps in your belly.  You have a very bad headache. These symptoms may be an emergency. Do not wait to see if the symptoms will go away. Get medical help right away. Call your local emergency services (911 in the [place] Do not drive yourself to the hospital. Summary  Antibiotics are used to treat infections caused by bacteria.  Take these medicines safely and only when needed.  Your doctor may change or stop your medicine based on your test results.  Take all your medicine even when you feel better. This information is not intended to replace advice given to you by your health care provider. Make sure you discuss any questions you have with your health care provider. Document Revised: 03/15/2019 Document Reviewed: 03/15/2019 Elsevier Patient Education  2021 [employer] Sinusitis, Adult Sinusitis is soreness and swelling (inflammation) of your sinuses. Sinuses are hollow spaces in the bones around your face. They are located:  Around your eyes.  In the middle of your forehead.  Behind your nose.  In your cheekbones. Your sinuses and nasal passages are lined with a fluid called mucus. Mucus drains out of your sinuses. Swelling can trap mucus in your sinuses. This lets germs (bacteria, virus, or fungus) grow,  which leads to infection. Most of the time, this condition is caused by a virus. What are the causes? This condition is caused by:  Allergies.  Asthma.  Germs.  Things that block your nose or sinuses.  Growths in the nose (nasal polyps).  Chemicals or irritants in the air.  Fungus (rare). What increases the risk? You are more likely to develop this condition if:  You have a weak body defense system (immune system).  You do a lot of swimming or diving.  You use nasal sprays too much.  You smoke. What are the signs or symptoms? The main symptoms of this condition are pain and a feeling of pressure around the sinuses. Other symptoms include:  Stuffy nose (congestion).  Runny nose (drainage).  Swelling and warmth in the sinuses.  Headache.  Toothache.  A cough that may get worse at night.  Mucus that collects in the throat or the back  of the nose (postnasal drip).  Being unable to smell and taste.  Being very tired (fatigue).  A fever.  Sore throat.  Bad breath. How is this diagnosed? This condition is diagnosed based on:  Your symptoms.  Your medical history.  A physical exam.  Tests to find out if your condition is short-term (acute) or long-term (chronic). Your doctor may: ? Check your nose for growths (polyps). ? Check your sinuses using a tool that has a light (endoscope). ? Check for allergies or germs. ? Do imaging tests, such as an MRI or CT scan. How is this treated? Treatment for this condition depends on the cause and whether it is short-term or long-term.  If caused by a virus, your symptoms should go away on their own within 10 days. You may be given medicines to relieve symptoms. They include: ? Medicines that shrink swollen tissue in the nose. ? Medicines that treat allergies (antihistamines). ? A spray that treats swelling of the nostrils. ? Rinses that help get rid of thick mucus in your nose (nasal saline washes).  If caused  by bacteria, your doctor may wait to see if you will get better without treatment. You may be given antibiotic medicine if you have: ? A very bad infection. ? A weak body defense system.  If caused by growths in the nose, you may need to have surgery. Follow these instructions at home: Medicines  Take, use, or apply over-the-counter and prescription medicines only as told by your doctor. These may include nasal sprays.  If you were prescribed an antibiotic medicine, take it as told by your doctor. Do not stop taking the antibiotic even if you start to feel better. Hydrate and humidify  Drink enough water to keep your pee (urine) pale yellow.  Use a cool mist humidifier to keep the humidity level in your home above 50%.  Breathe in steam for 10-15 minutes, 3-4 times a day, or as told by your doctor. You can do this in the bathroom while a hot shower is running.  Try not to spend time in cool or dry air.   Rest  Rest as much as you can.  Sleep with your head raised (elevated).  Make sure you get enough sleep each night. General instructions  Put a warm, moist washcloth on your face 3-4 times a day, or as often as told by your doctor. This will help with discomfort.  Wash your hands often with soap and water. If there is no soap and water, use hand sanitizer.  Do not smoke. Avoid being around people who are smoking (secondhand smoke).  Keep all follow-up visits as told by your doctor. This is important.   Contact a doctor if:  You have a fever.  Your symptoms get worse.  Your symptoms do not get better within 10 days. Get help right away if:  You have a very bad headache.  You cannot stop throwing up (vomiting).  You have very bad pain or swelling around your face or eyes.  You have trouble seeing.  You feel confused.  Your neck is stiff.  You have trouble breathing. Summary  Sinusitis is swelling of your sinuses. Sinuses are hollow spaces in the bones around  your face.  This condition is caused by tissues in your nose that become inflamed or swollen. This traps germs. These can lead to infection.  If you were prescribed an antibiotic medicine, take it as told by your doctor. Do not stop taking  it even if you start to feel better.  Keep all follow-up visits as told by your doctor. This is important. This information is not intended to replace advice given to you by your health care provider. Make sure you discuss any questions you have with your health care provider. Document Revised: 10/26/2017 Document Reviewed: 10/26/2017 Elsevier Patient Education  2021 [employer].

## 2020-07-20 ENCOUNTER — Telehealth: Payer: Self-pay | Admitting: Pharmacist

## 2020-07-20 NOTE — Telephone Encounter (Signed)
07/20/2020 2:07:23 PM - Epi-pen pending  -- Rhetta Mura - Friday, July 20, 2020 2:06 PM --I have today received the signed provider portion of [employer] application for Epi-pen, holding for patient to return her portion of new application mailed to patient 06/22/2020 and her current POI/Support.

## 2020-07-23 ENCOUNTER — Ambulatory Visit: Payer: Medicaid Other | Admitting: Pharmacist

## 2020-07-23 ENCOUNTER — Other Ambulatory Visit: Payer: Self-pay

## 2020-07-23 ENCOUNTER — Encounter: Payer: Self-pay | Admitting: Pharmacist

## 2020-07-23 DIAGNOSIS — Z79899 Other long term (current) drug therapy: Secondary | ICD-10-CM

## 2020-07-23 NOTE — Progress Notes (Signed)
Medication Management Clinic Visit Note  Patient: Kristy Hansen MRN: [id number] Date of Birth: [birth year] PCP: Patient, No Pcp Per   [name] 35 y.o. female presents in clinic today for her annual medication therapy management review with the pharmacist.  LMP 07/13/2020 (Exact Date)   Patient Information   Past Medical History:  Diagnosis Date  . Anxiety   . Autoimmune disease (HCC)   . Chiari malformation type I (HCC)   . Hematuria, gross   . Panic attack   . Pyelonephritis   . Renal cyst       Past Surgical History:  Procedure Laterality Date  . CYST REMOVAL PEDIATRIC     Spine  . MYRINGOTOMY WITH TUBE PLACEMENT    . SPINE SURGERY       Family History  Problem Relation Age of Onset  . Cancer Mother        breast cancer, ovarian cancer  . Diabetes Mother   . Hypertension Mother   . Diabetes Maternal Grandmother   . Diabetes Maternal Uncle   . Kidney disease Neg Hx   . Bladder Cancer Neg Hx   . Prostate cancer Neg Hx     New Diagnoses (since last visit):   Family Support: Good  Lifestyle Diet: Eats 3 meals per day. Drinks: Product manager.            Social History   Substance and Sexual Activity  Alcohol Use Not Currently  . Alcohol/week: 0.0 standard drinks   Comment: ocassionally      Social History   Tobacco Use  Smoking Status Former Smoker  . Packs/day: 0.50  . Years: 15.00  . Pack years: 7.50  . Types: Cigarettes  Smokeless Tobacco Never Used      Health Maintenance  Topic Date Due  . COVID-19 Vaccine (1) Never done  . HIV Screening  Never done  . PAP SMEAR-Modifier  07/24/2019  . INFLUENZA VACCINE  01/08/2020  . TETANUS/TDAP  05/07/2027  . Hepatitis C Screening  Completed   Outpatient Encounter Medications as of 07/23/2020  Medication Sig  . acetaminophen (TYLENOL) 325 MG tablet Take 650 mg by mouth every 6 (six) hours as needed for headache.  Marland Kitchen azithromycin (ZITHROMAX) 250 MG tablet Z pack  . metaxalone  (SKELAXIN) 800 MG tablet Take 1 tablet (800 mg total) by mouth 3 (three) times daily. (Patient taking differently: Take 800 mg by mouth 3 (three) times daily. Taking 1-2 per day)  . [DISCONTINUED] albuterol (VENTOLIN HFA) 108 (90 Base) MCG/ACT inhaler Inhale 2 puffs into the lungs every 6 (six) hours as needed for wheezing or shortness of breath.  . [DISCONTINUED] FLUoxetine (PROZAC) 20 MG tablet Take 40 mg by mouth daily.  (Patient not taking: No sig reported)   No facility-administered encounter medications on file as of 07/23/2020.   Health Maintenance/Date Completed  Last ED visit: 06/12/20 Last Visit to PCP: 07/19/20 Next Visit to PCP: 08/10/19 Specialist Visit: n/a Dental Exam: 6 months ago Eye Exam: November 21 Colonoscopy: none Flu Vaccine: pt does not want Pneumonia Vaccine: none COVID-19 Vaccine: no  Assessment and Plan:  Adherence: Takes medications as prescribed. States she does not like to take medications.   Anxiety: Not currently taking any medications for anxiety. She stopped taking the fluoxetine. She missed two appointments with her provider due to her memory loss. She will call the office to reschedule. Also informed Ms. Goyne that [facility] has a Child psychotherapist available.  Chiari Malformation: Endorses  headaches, neck/back pain, tremors, issues with balance and memory and short term memory loss. Uses acetaminophen as needed (3-4 times per month). Taking metaxalone 1-2 times daily. States this helps with the symptoms, but not the pain. She states the tremors in her hands are uncontrollable without the metaxalone.  Allergies: Seasonal allergies. Uses loratadine as needed.  Hx of Tobacco Use: History of tobacco use. Stopped smoking tobacco, but continues to vape with a 5% nicotine solution. She is getting to the point of not wanting to vape and has entertained the thought of quitting. States she no longer needs the inhalers since she has stopped smoking tobacco. Discussed  potential respiratory issues with continued vape usage.  Health Maintenance: Not interested in vaccinations.   RTC 1 year for outreach MTM.  Analicia Skibinski K. Joelene Millin, PharmD [facility]-Pharmacy Operations Coordinator [phone]

## 2020-08-09 ENCOUNTER — Ambulatory Visit: Payer: Medicaid Other | Admitting: Gerontology

## 2020-08-09 ENCOUNTER — Other Ambulatory Visit: Payer: Self-pay | Admitting: Gerontology

## 2020-08-09 ENCOUNTER — Other Ambulatory Visit: Payer: Self-pay

## 2020-08-09 ENCOUNTER — Encounter: Payer: Self-pay | Admitting: Gerontology

## 2020-08-09 VITALS — BP 115/78 | HR 82 | Temp 98.4°F | Resp 16 | Wt 111.4 lb

## 2020-08-09 DIAGNOSIS — R208 Other disturbances of skin sensation: Secondary | ICD-10-CM | POA: Insufficient documentation

## 2020-08-09 DIAGNOSIS — M25561 Pain in right knee: Secondary | ICD-10-CM | POA: Insufficient documentation

## 2020-08-09 DIAGNOSIS — M25562 Pain in left knee: Secondary | ICD-10-CM

## 2020-08-09 MED ORDER — LIDOCAINE 5 % EX PTCH
1.0000 | MEDICATED_PATCH | CUTANEOUS | 0 refills | Status: DC
Start: 2020-08-09 — End: 2020-08-09

## 2020-08-09 NOTE — Progress Notes (Signed)
Established Patient Office Visit  Subjective:  Patient ID: Kristy Hansen, female    DOB: 11-Sep-1985  Age: 35 y.o. MRN: [id number]  CC: No chief complaint on file.   HPI Kristy Hansen presents for follow up of Upper respiratory symptoms. Kristy Hansen reports that her symptoms has resolved. Currently, Kristy Hansen c/o bilateral knee pain that started 2 weeks ago. Kristy Hansen describes pain as sharp, constant, non radiating pain to her knees. Kristy Hansen states that pain intensity increases from 2-6. Kristy Hansen states that pain is worst when Kristy Hansen wakes up in the morning, lasts for 1 hour and Kristy Hansen experiences some relief as the day progresses. Kristy Hansen denies aggravating factords and walking minimally relieves symptoms.  Kristy Hansen also states that her toes are constantly cold, and the color changes from pale to red with reperfusion. Kristy Hansen denies numbness, motor and muscle weakness. Kristy Hansen states that symptoms has been going on for many years, but currently it's bothering her. Overall, Kristy Hansen states that Kristy Hansen's doing well and offers no further complaint.   Past Medical History:  Diagnosis Date  . Anxiety   . Autoimmune disease (HCC)   . Chiari malformation type I (HCC)   . Hematuria, gross   . Panic attack   . Pyelonephritis   . Renal cyst     Past Surgical History:  Procedure Laterality Date  . CYST REMOVAL PEDIATRIC     Spine  . MYRINGOTOMY WITH TUBE PLACEMENT    . SPINE SURGERY      Family History  Problem Relation Age of Onset  . Cancer Mother        breast cancer, ovarian cancer  . Diabetes Mother   . Hypertension Mother   . Diabetes Maternal Grandmother   . Diabetes Maternal Uncle   . Kidney disease Neg Hx   . Bladder Cancer Neg Hx   . Prostate cancer Neg Hx     Social History   Socioeconomic History  . Marital status: Legally Separated    Spouse name: Not on file  . Number of children: Not on file  . Years of education: Not on file  . Highest education level: Not on file  Occupational History  . Not on file   Tobacco Use  . Smoking status: Former Smoker    Packs/day: 0.50    Years: 15.00    Pack years: 7.50    Types: Cigarettes  . Smokeless tobacco: Never Used  Vaping Use  . Vaping Use: Every day  Substance and Sexual Activity  . Alcohol use: Not Currently    Alcohol/week: 0.0 standard drinks    Comment: ocassionally  . Drug use: No  . Sexual activity: Yes    Partners: Male  Other Topics Concern  . Not on file  Social History Narrative  . Not on file   Social Determinants of Health   Financial Resource Strain: Not on file  Food Insecurity: Not on file  Transportation Needs: Not on file  Physical Activity: Not on file  Stress: Not on file  Social Connections: Not on file  Intimate Partner Violence: Not on file    Outpatient Medications Prior to Visit  Medication Sig Dispense Refill  . acetaminophen (TYLENOL) 325 MG tablet Take 650 mg by mouth every 6 (six) hours as needed for headache.    . loratadine (CLARITIN) 10 MG tablet Take 10 mg by mouth as needed for allergies.    . metaxalone (SKELAXIN) 800 MG tablet Take 1 tablet (800 mg total) by mouth 3 (three) times  daily. (Patient taking differently: Take 800 mg by mouth 3 (three) times daily. Taking 1-2 per day) 90 tablet 3  . azithromycin (ZITHROMAX) 250 MG tablet Z pack 6 tablet 0   No facility-administered medications prior to visit.    Allergies  Allergen Reactions  . Penicillins Anaphylaxis, Shortness Of Breath and Swelling    Has patient had a PCN reaction causing immediate rash, facial/tongue/throat swelling, SOB or lightheadedness with hypotension: Yes Has patient had a PCN reaction causing severe rash involving mucus membranes or skin necrosis: Unknown Has patient had a PCN reaction that required hospitalization: No Has patient had a PCN reaction occurring within the last 10 years: No If all of the above answers are "NO", then may proceed with Cephalosporin use.   . Chantix [Varenicline] Other (See Comments)     Nightmares  . Aspirin Hives  . Benzonatate     Hallucination  . Naprosyn [Naproxen] Hives  . Tramadol Hives    ROS Review of Systems  Constitutional: Negative.   Respiratory: Negative.   Cardiovascular: Negative.   Musculoskeletal: Positive for arthralgias (bilateral knee pain).  Skin: Positive for color change (to toes).  Psychiatric/Behavioral: Negative.       Objective:    Physical Exam HENT:     Head: Normocephalic and atraumatic.  Cardiovascular:     Rate and Rhythm: Normal rate and regular rhythm.     Pulses: Normal pulses.     Heart sounds: Normal heart sounds.  Pulmonary:     Effort: Pulmonary effort is normal.     Breath sounds: Normal breath sounds.  Musculoskeletal:        General: Normal range of motion.  Skin:    General: Skin is cool.     Coloration: Skin is not cyanotic or mottled.       Neurological:     General: No focal deficit present.     Mental Status: Kristy Hansen is alert and oriented to person, place, and time. Mental status is at baseline.  Psychiatric:        Mood and Affect: Mood normal.        Behavior: Behavior normal.        Thought Content: Thought content normal.        Judgment: Judgment normal.     BP 115/78 (BP Location: Left Arm, Patient Position: Sitting, Cuff Size: Normal)   Pulse 82   Temp 98.4 F (36.9 C)   Resp 16   Wt 111 lb 6.4 oz (50.5 kg)   LMP 07/13/2020 (Exact Date)   SpO2 99%   BMI 20.88 kg/m  Wt Readings from Last 3 Encounters:  08/09/20 111 lb 6.4 oz (50.5 kg)  07/19/20 114 lb 3.2 oz (51.8 kg)  06/19/20 112 lb 8 oz (51 kg)     Health Maintenance Due  Topic Date Due  . COVID-19 Vaccine (1) Never done  . HIV Screening  Never done  . PAP SMEAR-Modifier  07/24/2019  . INFLUENZA VACCINE  01/08/2020    There are no preventive care reminders to display for this patient.  Lab Results  Component Value Date   TSH 1.310 11/25/2018   Lab Results  Component Value Date   WBC 7.1 02/15/2020   HGB 13.6  02/15/2020   HCT 40.9 02/15/2020   MCV 89 02/15/2020   PLT 258 02/15/2020   Lab Results  Component Value Date   NA 139 02/15/2020   K 4.2 02/15/2020   CO2 26 02/15/2020   GLUCOSE 66 02/15/2020  BUN 11 02/15/2020   CREATININE 0.66 02/15/2020   BILITOT 0.8 05/09/2020   ALKPHOS 43 (L) 05/09/2020   AST 14 05/09/2020   ALT 15 05/09/2020   PROT 6.5 05/09/2020   ALBUMIN 4.6 05/09/2020   CALCIUM 9.4 02/15/2020   ANIONGAP 8 02/12/2018   Lab Results  Component Value Date   CHOL 157 11/25/2018   Lab Results  Component Value Date   HDL 47 11/25/2018   Lab Results  Component Value Date   LDLCALC 97 11/25/2018   Lab Results  Component Value Date   TRIG 63 11/25/2018   Lab Results  Component Value Date   CHOLHDL 3.3 11/25/2018   Lab Results  Component Value Date   HGBA1C 5.0 11/25/2018      Assessment & Plan:     1. 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 Kristy Hansen was advised to notify clinic for worsening symptoms so to follow up with [facility] Dr Justice Rocher. - lidocaine (LIDODERM) 5 %; Place 1 patch onto the skin daily. Remove & Discard patch within 12 hours or as directed by MD  Dispense: 30 patch; Refill: 0  2. 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 Kristy Hansen was advised to notify clinic for worsening symptoms or go to the ED.   Follow-up: Return in about 13 weeks (around 11/08/2020), or if symptoms worsen or fail to improve.    Sylvan Lahm Trellis Paganini, NP

## 2020-08-09 NOTE — Patient Instructions (Signed)
 Raynaud Phenomenon  Raynaud phenomenon is a condition that affects the blood vessels (arteries) that carry blood to your fingers and toes. The arteries that supply blood to your ears, lips, nipples, or the tip of your nose might also be affected. Raynaud phenomenon causes the arteries to become narrow temporarily (spasm). As a result, the flow of blood to the affected areas is temporarily decreased. This usually occurs in response to cold temperatures or stress. During an attack, the skin in the affected areas turns white, then blue, and finally red. You may also feel tingling or numbness in those areas. Attacks usually last for only a brief period, and then the blood flow to the area returns to normal. In most cases, Raynaud phenomenon does not cause serious health problems. What are the causes? In many cases, the cause of this condition is not known. The condition may occur on its own (primary Raynaud phenomenon) or may be associated with other diseases or factors (secondary Raynaud phenomenon). Possible causes may include:  Diseases or medical conditions that damage the arteries.  Injuries and repetitive actions that hurt the hands or feet.  Being exposed to certain chemicals.  Taking medicines that narrow the arteries.  Other medical conditions, such as lupus, scleroderma, rheumatoid arthritis, thyroid problems, blood disorders, Sjogren syndrome, or atherosclerosis. What increases the risk? The following factors may make you more likely to develop this condition:  Being 20-40 years old.  Being female.  Having a family history of Raynaud phenomenon.  Living in a cold climate.  Smoking. What are the signs or symptoms? Symptoms of this condition usually occur when you are exposed to cold temperatures or when you have emotional stress. The symptoms may last for a few minutes or up to several hours. They usually affect your fingers but may also affect your toes, nipples, lips, ears,  or the tip of your nose. Symptoms may include:  Changes in skin color. The skin in the affected areas will turn pale or white. The skin may then change from white to bluish to red as normal blood flow returns to the area.  Numbness, tingling, or pain in the affected areas. In severe cases, symptoms may include:  Skin sores.  Tissues decaying and dying (gangrene). How is this diagnosed? This condition may be diagnosed based on:  Your symptoms and medical history.  A physical exam. During the exam, you may be asked to put your hands in cold water to check for a reaction to cold temperature.  Tests, such as: ? Blood tests to check for other diseases or conditions. ? A test to check the movement of blood through your arteries and veins (vascular ultrasound). ? A test in which the skin at the base of your fingernail is examined under a microscope (nailfold capillaroscopy). How is this treated? Treatment for this condition often involves making lifestyle changes and taking steps to control your exposure to cold temperatures. For more severe cases, medicine (calcium channel blockers) may be used to improve blood flow. Surgery is sometimes done to block the nerves that control the affected arteries, but this is rare. Follow these instructions at home: Avoiding cold temperatures Take these steps to avoid exposure to cold:  If possible, stay indoors during cold weather.  When you go outside during cold weather, dress in layers and wear mittens, a hat, a scarf, and warm footwear.  Wear mittens or gloves when handling ice or frozen food.  Use holders for glasses or cans containing cold drinks.    Let warm water run for a while before taking a shower or bath.  Warm up the car before driving in cold weather. Lifestyle  If possible, avoid stressful and emotional situations. Try to find ways to manage your stress, such as: ? Exercise. ? Yoga. ? Meditation. ? Biofeedback.  Do not use any  products that contain nicotine or tobacco, such as cigarettes and e-cigarettes. If you need help quitting, ask your health care provider.  Avoid secondhand smoke.  Limit your use of caffeine. ? Switch to decaffeinated coffee, tea, and soda. ? Avoid chocolate.  Avoid vibrating tools and machinery. General instructions  Protect your hands and feet from injuries, cuts, or bruises.  Avoid wearing tight rings or wristbands.  Wear loose fitting socks and comfortable, roomy shoes.  Take over-the-counter and prescription medicines only as told by your health care provider. Contact a health care provider if:  Your discomfort becomes worse despite lifestyle changes.  You develop sores on your fingers or toes that do not heal.  Your fingers or toes turn black.  You have breaks in the skin on your fingers or toes.  You have a fever.  You have pain or swelling in your joints.  You have a rash.  Your symptoms occur on only one side of your body. Summary  Raynaud phenomenon is a condition that affects the arteries that carry blood to your fingers, toes, ears, lips, nipples, or the tip of your nose.  In many cases, the cause of this condition is not known.  Symptoms of this condition include changes in skin color, and numbness and tingling of the affected area.  Treatment for this condition includes lifestyle changes, reducing exposure to cold temperatures, and using medicines for severe cases of the condition.  Contact your health care provider if your condition worsens despite treatment. This information is not intended to replace advice given to you by your health care provider. Make sure you discuss any questions you have with your health care provider. Document Revised: 10/06/2019 Document Reviewed: 10/06/2019 Elsevier Patient Education  2021 Elsevier Inc.  

## 2020-08-11 ENCOUNTER — Encounter: Payer: Self-pay | Admitting: Emergency Medicine

## 2020-08-11 ENCOUNTER — Other Ambulatory Visit: Payer: Self-pay

## 2020-08-11 ENCOUNTER — Emergency Department
Admission: EM | Admit: 2020-08-11 | Discharge: 2020-08-11 | Disposition: A | Discharge status: Home / Self Care | Payer: Medicaid Other | Attending: Emergency Medicine | Admitting: Emergency Medicine

## 2020-08-11 DIAGNOSIS — Z87891 Personal history of nicotine dependence: Secondary | ICD-10-CM | POA: Insufficient documentation

## 2020-08-11 DIAGNOSIS — F419 Anxiety disorder, unspecified: Secondary | ICD-10-CM | POA: Insufficient documentation

## 2020-08-11 DIAGNOSIS — R457 State of emotional shock and stress, unspecified: Secondary | ICD-10-CM | POA: Insufficient documentation

## 2020-08-11 DIAGNOSIS — F439 Reaction to severe stress, unspecified: Secondary | ICD-10-CM

## 2020-08-11 LAB — COMPREHENSIVE METABOLIC PANEL
ALT: 15 U/L (ref 0–44)
AST: 17 U/L (ref 15–41)
Albumin: 4.6 g/dL (ref 3.5–5.0)
Alkaline Phosphatase: 43 U/L (ref 38–126)
Anion gap: 7 (ref 5–15)
BUN: 12 mg/dL (ref 6–20)
CO2: 26 mmol/L (ref 22–32)
Calcium: 9.5 mg/dL (ref 8.9–10.3)
Chloride: 104 mmol/L (ref 98–111)
Creatinine, Ser: 0.48 mg/dL (ref 0.44–1.00)
GFR, Estimated: >60 mL/min (ref >60)
Glucose, Bld: 120 mg/dL — ABNORMAL HIGH (ref 70–99)
Potassium: 4 mmol/L (ref 3.5–5.1)
Sodium: 137 mmol/L (ref 135–145)
Total Bilirubin: 0.9 mg/dL (ref 0.3–1.2)
Total Protein: 7.4 g/dL (ref 6.5–8.1)

## 2020-08-11 LAB — CBC
HCT: 39.3 % (ref 36.0–46.0)
Hemoglobin: 12.8 g/dL (ref 12.0–15.0)
MCH: 29.4 pg (ref 26.0–34.0)
MCHC: 32.6 g/dL (ref 30.0–36.0)
MCV: 90.3 fL (ref 80.0–100.0)
Platelets: 230 10*3/uL (ref 150–400)
RBC: 4.35 MIL/uL (ref 3.87–5.11)
RDW: 11.8 % (ref 11.5–15.5)
WBC: 11.5 10*3/uL — ABNORMAL HIGH (ref 4.0–10.5)
nRBC: 0 % (ref 0.0–0.2)

## 2020-08-11 LAB — URINE DRUG SCREEN, QUALITATIVE (ARMC ONLY)
Amphetamines, Ur Screen: NOT DETECTED
Barbiturates, Ur Screen: NOT DETECTED
Benzodiazepine, Ur Scrn: NOT DETECTED
Cannabinoid 50 Ng, Ur ~~LOC~~: POSITIVE — AB
Cocaine Metabolite,Ur ~~LOC~~: NOT DETECTED
MDMA (Ecstasy)Ur Screen: NOT DETECTED
Methadone Scn, Ur: NOT DETECTED
Opiate, Ur Screen: NOT DETECTED
Phencyclidine (PCP) Ur S: NOT DETECTED
Tricyclic, Ur Screen: NOT DETECTED

## 2020-08-11 LAB — ETHANOL: Alcohol, Ethyl (B): <10 mg/dL (ref <10)

## 2020-08-11 LAB — SALICYLATE LEVEL: Salicylate Lvl: <7 mg/dL — ABNORMAL LOW (ref 7.0–30.0)

## 2020-08-11 LAB — ACETAMINOPHEN LEVEL: Acetaminophen (Tylenol), Serum: <10 ug/mL — ABNORMAL LOW (ref 10–30)

## 2020-08-11 NOTE — ED Provider Notes (Signed)
[facility] Emergency Department Provider Note  Time seen: 4:52 PM  I have reviewed the triage vital signs and the nursing notes.   HISTORY  Chief Complaint Mental Health Problem   HPI Kristy Hansen is a 35 y.o. female with a past medical history of anxiety, presents to the emergency department for worsening stress anxiety and depression.  According to the patient she states over the past several months she feels like her anxiety and stress have worsened significantly.  Denies any specific factor but states there has been a lot going on but is letter to this.  No SI or HI.  No alcohol or drug use.  Patient has no medical complaints.  Patient [facility] a [facility] but does not see a psychiatrist and is not currently on any medications.   Past Medical History:  Diagnosis Date  . Anxiety   . Autoimmune disease (HCC)   . Chiari malformation type I (HCC)   . Hematuria, gross   . Panic attack   . Pyelonephritis   . Renal cyst     Patient Active Problem List   Diagnosis Date Noted  . Acute bilateral knee pain 08/09/2020  . Cold skin 08/09/2020  . Upper respiratory infection, viral 07/19/2020  . Acute bacterial sinusitis 07/19/2020  . Elevated liver enzymes 03/06/2020  . Kidney stone on left side 03/06/2020  . Blurry vision, bilateral 02/08/2020  . Fatigue 02/08/2020  . Cough 12/15/2019  . Chiari malformation type I (HCC) 10/20/2017  . Galactorrhea 06/19/2016  . Irregular menses 06/19/2016  . Renal cyst 01/05/2015  . Paresthesias 12/12/2014  . Family history of ovarian cancer 12/12/2014  . Tobacco user 12/12/2014  . Renal mass 12/05/2014  . Anxiety 10/11/2014  . Back pain, chronic 07/17/2014    Past Surgical History:  Procedure Laterality Date  . CYST REMOVAL PEDIATRIC     Spine  . MYRINGOTOMY WITH TUBE PLACEMENT    . SPINE SURGERY      Prior to Admission medications   Medication Sig Start Date End Date Taking? Authorizing Provider   acetaminophen (TYLENOL) 325 MG tablet Take 650 mg by mouth every 6 (six) hours as needed for headache.    [provider]  lidocaine (LIDODERM) 5 % Place 1 patch onto the skin daily. Remove & Discard patch within 12 hours or as directed by MD 08/09/20   Iloabachie, Chioma E, NP  loratadine (CLARITIN) 10 MG tablet Take 10 mg by mouth as needed for allergies.    [provider]  metaxalone (SKELAXIN) 800 MG tablet Take 1 tablet (800 mg total) by mouth 3 (three) times daily. Patient taking differently: Take 800 mg by mouth 3 (three) times daily. Taking 1-2 per day 05/17/20 05/17/21  Eulogio Bear E, NP    Allergies  Allergen Reactions  . Penicillins Anaphylaxis, Shortness Of Breath and Swelling    Has patient had a PCN reaction causing immediate rash, facial/tongue/throat swelling, SOB or lightheadedness with hypotension: Yes Has patient had a PCN reaction causing severe rash involving mucus membranes or skin necrosis: Unknown Has patient had a PCN reaction that required hospitalization: No Has patient had a PCN reaction occurring within the last 10 years: No If all of the above answers are "NO", then may proceed with Cephalosporin use.   . Chantix [Varenicline] Other (See Comments)    Nightmares  . Aspirin Hives  . Benzonatate     Hallucination  . Naprosyn [Naproxen] Hives  . Tramadol Hives  Family History  Problem Relation Age of Onset  . Cancer Mother        breast cancer, ovarian cancer  . Diabetes Mother   . Hypertension Mother   . Diabetes Maternal Grandmother   . Diabetes Maternal Uncle   . Kidney disease Neg Hx   . Bladder Cancer Neg Hx   . Prostate cancer Neg Hx     Social History Social History   Tobacco Use  . Smoking status: Former Smoker    Packs/day: 0.50    Years: 15.00    Pack years: 7.50    Types: Cigarettes  . Smokeless tobacco: Never Used  Vaping Use  . Vaping Use: Every day  Substance Use Topics  . Alcohol use: Not Currently     Alcohol/week: 0.0 standard drinks    Comment: ocassionally  . Drug use: No    Review of Systems Constitutional: Negative for fever. Cardiovascular: Negative for chest pain. Respiratory: Negative for shortness of breath. Gastrointestinal: Negative for abdominal pain, vomiting  Musculoskeletal: Negative for musculoskeletal complaints Neurological: Negative for headache All other ROS negative  ____________________________________________   PHYSICAL EXAM:  VITAL SIGNS: ED Triage Vitals  Enc Vitals Group     BP 08/11/20 1551 (!) 158/109     Pulse Rate 08/11/20 1551 (!) 106     Resp 08/11/20 1551 20     Temp 08/11/20 1551 98.3 F (36.8 C)     Temp Source 08/11/20 1551 Oral     SpO2 08/11/20 1551 99 %     Weight 08/11/20 1549 111 lb (50.3 kg)     Height 08/11/20 1549 5\' 1"  (1.549 m)     Head Circumference --      Peak Flow --      Pain Score 08/11/20 1549 0     Pain Loc --      Pain Edu? --      Excl. in GC? --    Constitutional: Alert and oriented. Well appearing and in no distress. Eyes: Normal exam ENT      Head: Normocephalic and atraumatic.      Mouth/Throat: Mucous membranes are moist. Cardiovascular: Normal rate, regular rhythm Respiratory: Normal respiratory effort without tachypnea nor retractions. Breath sounds are clear  Gastrointestinal: Soft and nontender. No distention.   Musculoskeletal: Nontender with normal range of motion in all extremities. Neurologic:  Normal speech and language. No gross focal neurologic deficits  Skin:  Skin is warm, dry and intact.  Psychiatric: Mood and affect are normal.   ____________________________________________   INITIAL IMPRESSION / ASSESSMENT AND PLAN / ED COURSE  Pertinent labs & imaging results that were available during my care of the patient were reviewed by me and considered in my medical decision making (see chart for details).   Patient presents emergency department for increased stress anxiety.  No  specific factor but states there is a lot going on.  Patient has not currently seeing a psychiatrist and is not currently on any medications.  Patient came to the emergency department tonight hoping to speak to a psychiatrist regarding these issues.  Patient has no medical concerns.  We will have psychiatry TTS evaluate the patient.  Lab work is nonrevealing.  Patient no longer wishes to wait for a psychiatrist. Patient is here voluntarily. Medical work-up is nonrevealing. We will discharge the patient per her request.  Chara Marquard Dormer was evaluated in Emergency Department on 08/11/2020 for the symptoms described in the history of present illness. She was evaluated in  the context of the global COVID-19 pandemic, which necessitated consideration that the patient might be at risk for infection with the SARS-CoV-2 virus that causes COVID-19. Institutional protocols and algorithms that pertain to the evaluation of patients at risk for COVID-19 are in a state of rapid change based on information released by regulatory bodies including the CDC and federal and state organizations. These policies and algorithms were followed during the patient's care in the ED.  ____________________________________________   FINAL CLINICAL IMPRESSION(S) / ED DIAGNOSES  Anxiety Stress   Minna Antis, MD 08/11/20 [phone]

## 2020-08-11 NOTE — ED Triage Notes (Signed)
Pt reports has been under a lot of stress for a few months and needs some help. Pt tearful in triage. Pt denies SI/HI currently but reports has felt that way in the past.

## 2020-08-11 NOTE — ED Notes (Signed)
Dr. Paduchowski at bedside.  

## 2020-08-11 NOTE — ED Notes (Signed)
Called [facility] for consult 1800

## 2020-08-11 NOTE — ED Notes (Signed)
Pt requesting to go home- states she feels like she had a panic attack- Dr Lenard Lance aware

## 2020-08-11 NOTE — ED Notes (Signed)
Pt dressed out by this tech and Joyice Faster, EDT into hospital attire scrubs. Pt belongings consists of: 1 black purse Pair of pink shoes Black tank top White bra Leisure centre manager of socks Black leggings Underwear 1 silver ring 1 silver nose ring Pts belongings labeled 1 of 1 and given to primery nurse Ariel, RN. 1 cheetah masks

## 2020-08-15 ENCOUNTER — Encounter: Payer: Self-pay | Admitting: Adult Health

## 2020-08-15 ENCOUNTER — Other Ambulatory Visit: Payer: Self-pay

## 2020-08-15 ENCOUNTER — Other Ambulatory Visit: Payer: Self-pay | Admitting: Adult Health

## 2020-08-15 ENCOUNTER — Ambulatory Visit: Payer: Medicaid Other | Admitting: Adult Health

## 2020-08-15 VITALS — BP 112/81 | HR 67 | Temp 97.7°F | Resp 16 | Wt 110.0 lb

## 2020-08-15 DIAGNOSIS — N3000 Acute cystitis without hematuria: Secondary | ICD-10-CM

## 2020-08-15 MED ORDER — SULFAMETHOXAZOLE-TRIMETHOPRIM 800-160 MG PO TABS: 1.0000 | ORAL_TABLET | Freq: Two times a day (BID) | ORAL | 0 refills | Status: DC

## 2020-08-15 MED ORDER — PHENAZOPYRIDINE HCL 100 MG PO TABS: 100.0000 mg | ORAL_TABLET | Freq: Three times a day (TID) | ORAL | 0 refills | Status: DC | PRN

## 2020-08-15 NOTE — Progress Notes (Signed)
Patient: Kristy Hansen Female    DOB: 07-Apr-1986   34 y.o.   MRN: [id number] Visit Date: 08/15/2020  Today's Provider: Shawn Route, NP   No chief complaint on file.  Subjective:    HPI  This is a 35 y/o female who presents with complaints of urinary urgency, frequency and lower abdominal pressure that started this weekend. Symptoms have progressively gotten worse. She denies fever, vomiting but report nausea. Took tylenol and motrin without any relief. Reports a mild clear discharge with no odor. No hematuria. She has a h/o frequent UTIs. She is sexually active and uses protection. She denies any recent STDs.   Allergies  Allergen Reactions  . Penicillins Anaphylaxis, Shortness Of Breath and Swelling    Has patient had a PCN reaction causing immediate rash, facial/tongue/throat swelling, SOB or lightheadedness with hypotension: Yes Has patient had a PCN reaction causing severe rash involving mucus membranes or skin necrosis: Unknown Has patient had a PCN reaction that required hospitalization: No Has patient had a PCN reaction occurring within the last 10 years: No If all of the above answers are "NO", then may proceed with Cephalosporin use.   . Chantix [Varenicline] Other (See Comments)    Nightmares  . Aspirin Hives  . Benzonatate     Hallucination  . Naprosyn [Naproxen] Hives  . Tramadol Hives   Previous Medications   ACETAMINOPHEN (TYLENOL) 325 MG TABLET    Take 650 mg by mouth every 6 (six) hours as needed for headache.   LIDOCAINE (LIDODERM) 5 %    Place 1 patch onto the skin daily. Remove & Discard patch within 12 hours or as directed by MD   LORATADINE (CLARITIN) 10 MG TABLET    Take 10 mg by mouth as needed for allergies.   METAXALONE (SKELAXIN) 800 MG TABLET    Take 1 tablet (800 mg total) by mouth 3 (three) times daily.    Review of Systems  Constitutional: Negative.   Gastrointestinal: Positive for abdominal pain (Lower abdominal pain).   Genitourinary: Positive for dysuria, frequency and vaginal discharge (WHITE). Negative for hematuria.  Musculoskeletal: Negative.   Neurological: Negative.     Social History   Tobacco Use  . Smoking status: Former Smoker    Packs/day: 0.50    Years: 15.00    Pack years: 7.50    Types: Cigarettes  . Smokeless tobacco: Never Used  Substance Use Topics  . Alcohol use: Not Currently    Alcohol/week: 0.0 standard drinks    Comment: ocassionally   Objective:   BP 112/81 (BP Location: Right Arm, Patient Position: Sitting, Cuff Size: Normal)   Pulse 67   Temp 97.7 F (36.5 C)   Resp 16   Wt 110 lb (49.9 kg)   LMP 08/06/2020 (Exact Date)   SpO2 99%   BMI 20.78 kg/m   Physical Exam Constitutional:      Appearance: Normal appearance.  HENT:     Mouth/Throat:     Mouth: Mucous membranes are moist.  Eyes:     Pupils: Pupils are equal, round, and reactive to light.  Cardiovascular:     Rate and Rhythm: Normal rate and regular rhythm.     Pulses: Normal pulses.     Heart sounds: Normal heart sounds.  Pulmonary:     Effort: Pulmonary effort is normal.     Breath sounds: Normal breath sounds.  Abdominal:     General: Abdomen is flat. Bowel sounds are normal.     Tenderness:  There is abdominal tenderness (pain with palpation of lower abdominal quadrants). There is right CVA tenderness. There is no left CVA tenderness.  Skin:    General: Skin is warm.  Neurological:     Mental Status: She is alert.         Assessment & Plan:   1. Acute cystitis without hematuria Will  Treat empirically with bactrim and f/u with urine culture and sensitivity. Will adjust antibiotics based on culture results. Patient advised to drink lots of low calorie fluids, maintain proper hygiene and take prn pyridium for bladder spasms.    - Urinalysis, Routine w reflex microscopic - Urine Culture  RTC if symptoms worsen  Shawn Route, NP   [facility]

## 2020-08-16 ENCOUNTER — Telehealth: Payer: Self-pay | Admitting: Pharmacist

## 2020-08-16 NOTE — Telephone Encounter (Signed)
Provided 2022 proof of income. Approved to receive medication assistance at [facility] until time for re-certification in 5364, and as long as eligibility criteria continues to be met.   [place]

## 2020-08-19 LAB — MICROSCOPIC EXAMINATION
Bacteria, UA: NONE SEEN
Casts: NONE SEEN /lpf

## 2020-08-19 LAB — URINE CULTURE

## 2020-08-19 LAB — URINALYSIS, ROUTINE W REFLEX MICROSCOPIC
Bilirubin, UA: NEGATIVE
Glucose, UA: NEGATIVE
Ketones, UA: NEGATIVE
Leukocytes,UA: NEGATIVE
Nitrite, UA: NEGATIVE
Protein,UA: NEGATIVE
Specific Gravity, UA: 1.022 (ref 1.005–1.030)
Urobilinogen, Ur: 1 mg/dL (ref 0.2–1.0)
pH, UA: 6.5 (ref 5.0–7.5)

## 2020-08-22 ENCOUNTER — Ambulatory Visit: Payer: Medicaid Other | Admitting: Adult Health

## 2020-08-23 ENCOUNTER — Ambulatory Visit: Payer: Medicaid Other | Admitting: Adult Health

## 2020-09-05 ENCOUNTER — Encounter: Payer: Self-pay | Admitting: Adult Health

## 2020-09-05 ENCOUNTER — Other Ambulatory Visit: Payer: Self-pay

## 2020-09-05 ENCOUNTER — Ambulatory Visit: Payer: Medicaid Other | Admitting: Adult Health

## 2020-09-05 ENCOUNTER — Other Ambulatory Visit: Payer: Self-pay | Admitting: Adult Health

## 2020-09-05 VITALS — BP 126/85 | HR 78 | Temp 97.3°F | Ht 61.0 in | Wt 111.3 lb

## 2020-09-05 DIAGNOSIS — N921 Excessive and frequent menstruation with irregular cycle: Secondary | ICD-10-CM

## 2020-09-05 DIAGNOSIS — F422 Mixed obsessional thoughts and acts: Secondary | ICD-10-CM

## 2020-09-05 DIAGNOSIS — B354 Tinea corporis: Secondary | ICD-10-CM

## 2020-09-05 DIAGNOSIS — D500 Iron deficiency anemia secondary to blood loss (chronic): Secondary | ICD-10-CM

## 2020-09-05 MED ORDER — FERROUS SULFATE 325 (65 FE) MG PO TABS: 325.0000 mg | ORAL_TABLET | Freq: Every day | ORAL | 3 refills | Status: DC

## 2020-09-05 MED ORDER — FOLIC ACID 1 MG PO TABS: 1.0000 mg | ORAL_TABLET | Freq: Every day | ORAL | 3 refills | Status: DC

## 2020-09-05 MED ORDER — TRIAMCINOLONE ACETONIDE 0.5 % EX OINT: 1.0000 "application " | TOPICAL_OINTMENT | Freq: Two times a day (BID) | CUTANEOUS | 0 refills | Status: DC

## 2020-09-05 NOTE — Progress Notes (Signed)
  Patient: Kristy Hansen Female    DOB: 14-Jun-1985   34 y.o.   MRN: [id number] Visit Date: 09/05/2020  Today's Provider: Shawn Route, NP   Chief Complaint  Patient presents with  . Rash    Right leg, near knee. Circle shaped, itchy and turns purple   . Menstrual Problem    3 periods in the last month, about 40 days. Very tired, no birth control.   Subjective:    HPI: This is a 35 y/o female presenting for evaluation of metrorrhagia and rash on right inner aspect of knee. She has had 3 periods in 45 days-7 days, 6 days and 3rd 7 days. Reports fatigue, dizziness and generalized malaise. Rask erupted about a month agao and has gotten worse. She has severe dry skin   Allergies  Allergen Reactions  . Penicillins Anaphylaxis, Shortness Of Breath and Swelling    Has patient had a PCN reaction causing immediate rash, facial/tongue/throat swelling, SOB or lightheadedness with hypotension: Yes Has patient had a PCN reaction causing severe rash involving mucus membranes or skin necrosis: Unknown Has patient had a PCN reaction that required hospitalization: No Has patient had a PCN reaction occurring within the last 10 years: No If all of the above answers are "NO", then may proceed with Cephalosporin use.   . Chantix [Varenicline] Other (See Comments)    Nightmares  . Aspirin Hives  . Benzonatate     Hallucination  . Naprosyn [Naproxen] Hives  . Tramadol Hives   Previous Medications   ACETAMINOPHEN (TYLENOL) 325 MG TABLET    Take 650 mg by mouth every 6 (six) hours as needed for headache.   LIDOCAINE (LIDODERM) 5 %    Place 1 patch onto the skin daily. Remove & Discard patch within 12 hours or as directed by MD   LORATADINE (CLARITIN) 10 MG TABLET    Take 10 mg by mouth as needed for allergies.   METAXALONE (SKELAXIN) 800 MG TABLET    Take 1 tablet (800 mg total) by mouth 3 (three) times daily.   PHENAZOPYRIDINE (PYRIDIUM) 100 MG TABLET    Take 1 tablet (100 mg total) by mouth  3 (three) times daily as needed for pain.    Review of Systems  Social History   Tobacco Use  . Smoking status: Former Smoker    Packs/day: 0.50    Years: 15.00    Pack years: 7.50    Types: Cigarettes  . Smokeless tobacco: Never Used  Substance Use Topics  . Alcohol use: Not Currently    Alcohol/week: 0.0 standard drinks    Comment: ocassionally   Objective:   BP 126/85 (BP Location: Right Arm, Patient Position: Sitting, Cuff Size: Small)   Pulse 78   Temp (!) 97.3 F (36.3 C)   Ht 5\' 1"  (1.549 m)   Wt 111 lb 4.8 oz (50.5 kg)   LMP 08/06/2020 (Exact Date)   SpO2 100%   BMI 21.03 kg/m   Physical Exam      Assessment & Plan:           08/08/2020, NP   [facility]

## 2020-09-06 LAB — CBC WITH DIFFERENTIAL/PLATELET
Basophils Absolute: 0 10*3/uL (ref 0.0–0.2)
Basos: 1 %
EOS (ABSOLUTE): 0.1 10*3/uL (ref 0.0–0.4)
Eos: 2 %
Hematocrit: 37.7 % (ref 34.0–46.6)
Hemoglobin: 12.8 g/dL (ref 11.1–15.9)
Immature Grans (Abs): 0 10*3/uL (ref 0.0–0.1)
Immature Granulocytes: 0 %
Lymphocytes Absolute: 1.3 10*3/uL (ref 0.7–3.1)
Lymphs: 25 %
MCH: 29.7 pg (ref 26.6–33.0)
MCHC: 34 g/dL (ref 31.5–35.7)
MCV: 88 fL (ref 79–97)
Monocytes Absolute: 0.4 10*3/uL (ref 0.1–0.9)
Monocytes: 8 %
Neutrophils Absolute: 3.2 10*3/uL (ref 1.4–7.0)
Neutrophils: 64 %
Platelets: 231 10*3/uL (ref 150–450)
RBC: 4.31 x10E6/uL (ref 3.77–5.28)
RDW: 11.6 % — ABNORMAL LOW (ref 11.7–15.4)
WBC: 5.1 10*3/uL (ref 3.4–10.8)

## 2020-09-06 LAB — FOLATE: Folate: 5.5 ng/mL (ref >3.0)

## 2020-09-06 LAB — VITAMIN B12: Vitamin B-12: 389 pg/mL (ref 232–1245)

## 2020-09-06 LAB — IRON AND TIBC
Iron Saturation: 37 % (ref 15–55)
Iron: 123 ug/dL (ref 27–159)
Total Iron Binding Capacity: 332 ug/dL (ref 250–450)
UIBC: 209 ug/dL (ref 131–425)

## 2020-09-06 LAB — FERRITIN: Ferritin: 36 ng/mL (ref 15–150)

## 2020-09-08 ENCOUNTER — Other Ambulatory Visit (HOSPITAL_COMMUNITY): Payer: Self-pay

## 2020-09-10 ENCOUNTER — Encounter: Payer: Self-pay | Admitting: Obstetrics and Gynecology

## 2020-09-10 ENCOUNTER — Other Ambulatory Visit: Payer: Self-pay

## 2020-09-10 ENCOUNTER — Ambulatory Visit (INDEPENDENT_AMBULATORY_CARE_PROVIDER_SITE_OTHER): Payer: Self-pay | Admitting: Obstetrics and Gynecology

## 2020-09-10 VITALS — BP 124/86 | HR 66 | Ht 61.0 in | Wt 110.4 lb

## 2020-09-10 DIAGNOSIS — F4310 Post-traumatic stress disorder, unspecified: Secondary | ICD-10-CM

## 2020-09-10 DIAGNOSIS — N938 Other specified abnormal uterine and vaginal bleeding: Secondary | ICD-10-CM

## 2020-09-10 DIAGNOSIS — N920 Excessive and frequent menstruation with regular cycle: Secondary | ICD-10-CM
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
## 2020-09-11 ENCOUNTER — Other Ambulatory Visit: Payer: Self-pay

## 2020-09-12 ENCOUNTER — Other Ambulatory Visit: Payer: Self-pay

## 2020-09-12 MED ORDER — AZITHROMYCIN 250 MG PO TABS: ORAL_TABLET | ORAL | 0 refills | Status: AC

## 2020-09-21 ENCOUNTER — Other Ambulatory Visit: Payer: Self-pay

## 2020-09-21 MED ORDER — EPIPEN 2-PAK 0.3 MG/0.3ML IJ SOAJ
INTRAMUSCULAR | 0 refills | Status: DC
  Filled 2020-09-21: qty 2, 30d supply, fill #0

## 2020-09-24 ENCOUNTER — Other Ambulatory Visit: Payer: Self-pay

## 2020-09-26 ENCOUNTER — Other Ambulatory Visit: Payer: Self-pay

## 2020-09-26 MED FILL — Metaxalone Tab 800 MG: ORAL | 30 days supply | Qty: 90 | Fill #0 | Status: AC

## 2020-10-12 ENCOUNTER — Ambulatory Visit (INDEPENDENT_AMBULATORY_CARE_PROVIDER_SITE_OTHER): Payer: Medicaid Other | Admitting: Obstetrics and Gynecology

## 2020-10-12 ENCOUNTER — Other Ambulatory Visit: Payer: Self-pay

## 2020-10-12 ENCOUNTER — Encounter: Payer: Self-pay | Admitting: Obstetrics and Gynecology

## 2020-10-12 ENCOUNTER — Other Ambulatory Visit (HOSPITAL_COMMUNITY)
Admission: RE | Admit: 2020-10-12 | Discharge: 2020-10-12 | Disposition: A | Discharge status: Home / Self Care | Payer: Medicaid Other | Source: Ambulatory Visit | Attending: Obstetrics and Gynecology | Admitting: Obstetrics and Gynecology

## 2020-10-12 VITALS — BP 133/87 | HR 89 | Ht 61.25 in | Wt 113.9 lb

## 2020-10-12 DIAGNOSIS — B373 Candidiasis of vulva and vagina: Secondary | ICD-10-CM

## 2020-10-12 DIAGNOSIS — N898 Other specified noninflammatory disorders of vagina: Secondary | ICD-10-CM

## 2020-10-12 DIAGNOSIS — B3731 Acute candidiasis of vulva and vagina: Secondary | ICD-10-CM

## 2020-10-12 MED ORDER — FLUCONAZOLE 150 MG PO TABS
150.0000 mg | ORAL_TABLET | ORAL | 0 refills | Status: DC
  Filled 2020-10-12: qty 2, 6d supply, fill #0

## 2020-10-12 NOTE — Progress Notes (Signed)
HPI:      [name] is a 35 y.o. [phone] who LMP was No LMP recorded (lmp unknown).  Subjective:   She presents today with complaint of a 5-day history of vaginal itching and burning.  She says the itching is severe.  She has tried over-the-counter Monistat without success. She reports that she has the same sexual partner for the last 3 years and is not significantly concerned with STDs but would not mind cultures being done.  Additional blood work STD testing discussed and patient is considering this and will inform us at the end of her visit.    Hx: The following portions of the patient's history were reviewed and updated as appropriate:             She  has a past medical history of Anxiety, Autoimmune disease (HCC), Chiari malformation type I (HCC), Hematuria, gross, Panic attack, Pyelonephritis, and Renal cyst. She does not have any pertinent problems on file. She  has a past surgical history that includes Myringotomy with tube placement; Cyst removal pediatric; and Spine surgery. Her family history includes Cancer in her mother; Diabetes in her maternal grandmother, maternal uncle, and mother; Hypertension in her mother. She  reports that she has quit smoking. Her smoking use included cigarettes. She has a 7.50 pack-year smoking history. She has never used smokeless tobacco. She reports previous alcohol use. She reports that she does not use drugs. She has a current medication list which includes the following prescription(s): acetaminophen, epipen 2-pak, fluconazole, lidocaine, loratadine, metaxalone, ferrous sulfate, folic acid, phenazopyridine, sulfamethoxazole-trimethoprim, and triamcinolone ointment. She is allergic to penicillins, chantix [varenicline], aspirin, benzonatate, naprosyn [naproxen], and tramadol.       Review of Systems:  Review of Systems  Constitutional: Denied constitutional symptoms, night sweats, recent illness, fatigue, fever, insomnia and weight loss.   Eyes: Denied eye symptoms, eye pain, photophobia, vision change and visual disturbance.  Ears/Nose/Throat/Neck: Denied ear, nose, throat or neck symptoms, hearing loss, nasal discharge, sinus congestion and sore throat.  Cardiovascular: Denied cardiovascular symptoms, arrhythmia, chest pain/pressure, edema, exercise intolerance, orthopnea and palpitations.  Respiratory: Denied pulmonary symptoms, asthma, pleuritic pain, productive sputum, cough, dyspnea and wheezing.  Gastrointestinal: Denied, gastro-esophageal reflux, melena, nausea and vomiting.  Genitourinary: See HPI for additional information.  Musculoskeletal: Denied musculoskeletal symptoms, stiffness, swelling, muscle weakness and myalgia.  Dermatologic: Denied dermatology symptoms, rash and scar.  Neurologic: Denied neurology symptoms, dizziness, headache, neck pain and syncope.  Psychiatric: Denied psychiatric symptoms, anxiety and depression.  Endocrine: Denied endocrine symptoms including hot flashes and night sweats.   Meds:   Current Outpatient Medications on File Prior to Visit  Medication Sig Dispense Refill  . acetaminophen (TYLENOL) 325 MG tablet Take 650 mg by mouth every 6 (six) hours as needed for headache.    . EPIPEN 2-PAK 0.3 MG/0.3ML SOAJ injection INJECT AS DIRECTED AND AS NEEDED. 2 each 0  . lidocaine (LIDODERM) 5 % APPLY PATCH DAILY AS DIRECTED AND LEAVE ON FOR ONLY 12 HOURS THEN REMOVE. 30 patch 0  . loratadine (CLARITIN) 10 MG tablet Take 10 mg by mouth as needed for allergies.    . metaxalone (SKELAXIN) 800 MG tablet TAKE ONE TABLET BY MOUTH 3 TIMES A DAY (Patient taking differently: Take 800 mg by mouth 3 (three) times daily. Taking 1-2 per day) 90 tablet 3  . ferrous sulfate 325 (65 FE) MG tablet TAKE ONE TABLET BY MOUTH EVERY DAY WITH BREAKFAST (Patient not taking: Reported on 10/12/2020) 30 tablet  3  . folic acid (FOLVITE) 1 MG tablet TAKE ONE TABLET BY MOUTH EVERY DAY (Patient not taking: Reported on  10/12/2020) 30 tablet 3  . phenazopyridine (PYRIDIUM) 100 MG tablet Take 1 tablet (100 mg total) by mouth 3 (three) times daily as needed for pain. (Patient not taking: No sig reported) 10 tablet 0  . sulfamethoxazole-trimethoprim (BACTRIM DS) 800-160 MG tablet TAKE ONE TABLET BY MOUTH 2 TIMES A DAY FOR 7 DAYS (Patient not taking: No sig reported) 14 tablet 0  . triamcinolone ointment (KENALOG) 0.5 % APPLY 1 APPLICATION 2 TIMES A DAY AS DIRECTED (Patient not taking: Reported on 10/12/2020) 30 g 0   No current facility-administered medications on file prior to visit.          Objective:     Vitals:   10/12/20 0818  BP: 133/87  Pulse: 89   Filed Weights   10/12/20 0818  Weight: 113 lb 14.4 oz (51.7 kg)              Physical examination   Pelvic:   Vulva: Normal appearance.  No lesions.  Some erythema and irritation.  No blisters or ulcerations noted  Vagina: No lesions or abnormalities noted.  Support: Normal pelvic support.  Urethra No masses tenderness or scarring.  Meatus Normal size without lesions or prolapse.  Cervix: Normal appearance.  No lesions.  Anus: Normal exam.  No lesions.  Perineum: Normal exam.  No lesions.        Bimanual   Uterus: Normal size.  Non-tender.  Mobile.  AV.  Adnexae: No masses.  Non-tender to palpation.  Cul-de-sac: Negative for abnormality.   WET PREP: clue cells: absent, KOH (yeast): positive, odor: absent and trichomoniasis: negative Ph:  < 4.5   Assessment:    [id number] Patient Active Problem List   Diagnosis Date Noted  . Acute bilateral knee pain 08/09/2020  . Cold skin 08/09/2020  . Upper respiratory infection, viral 07/19/2020  . Acute bacterial sinusitis 07/19/2020  . Elevated liver enzymes 03/06/2020  . Kidney stone on left side 03/06/2020  . Blurry vision, bilateral 02/08/2020  . Fatigue 02/08/2020  . Cough 12/15/2019  . Chiari malformation type I (HCC) 10/20/2017  . Galactorrhea 06/19/2016  . Irregular menses 06/19/2016   . Renal cyst 01/05/2015  . Paresthesias 12/12/2014  . Family history of ovarian cancer 12/12/2014  . Tobacco user 12/12/2014  . Renal mass 12/05/2014  . Anxiety 10/11/2014  . Back pain, chronic 07/17/2014     1. Vaginal discharge   2. Monilial vulvovaginitis     Budding yeast noted on wet prep -patient describes most of the irritation as outside.  She has been using Monistat intravaginally.   Plan:            1.  Treat with Diflucan and advised use of Monistat externally.  2.  Nuswab sent Orders No orders of the defined types were placed in this encounter.    Meds ordered this encounter  Medications  . fluconazole (DIFLUCAN) 150 MG tablet    Sig: Take 1 tablet (150 mg total) by mouth every 3 (three) days. For three doses    Dispense:  2 tablet    Refill:  0      F/U  No follow-ups on file. I spent 22 minutes involved in the care of this patient preparing to see the patient by obtaining and reviewing her medical history (including labs, imaging tests and prior procedures), documenting clinical information in the electronic health record (  EHR), counseling and coordinating care plans, writing and sending prescriptions, ordering tests or procedures and directly communicating with the patient by discussing pertinent items from her history and physical exam as well as detailing my assessment and plan as noted above so that she has an informed understanding.  All of her questions were answered.  Elonda Husky, M.D. 10/12/2020 9:14 AM

## 2020-10-15 ENCOUNTER — Other Ambulatory Visit: Payer: Self-pay

## 2020-10-15 LAB — CERVICOVAGINAL ANCILLARY ONLY
Bacterial Vaginitis (gardnerella): NEGATIVE
Candida Glabrata: NEGATIVE
Candida Vaginitis: NEGATIVE
Chlamydia: NEGATIVE
Comment: NEGATIVE
Comment: NEGATIVE
Comment: NEGATIVE
Comment: NEGATIVE
Comment: NEGATIVE
Comment: NORMAL
Neisseria Gonorrhea: NEGATIVE
Trichomonas: NEGATIVE

## 2020-10-15 MED FILL — Ferrous Sulfate Tab 325 MG (65 MG Elemental Fe): ORAL | 30 days supply | Qty: 30 | Fill #0 | Status: AC

## 2020-10-15 MED FILL — Folic Acid Tab 1 MG: ORAL | 30 days supply | Qty: 30 | Fill #0 | Status: AC

## 2020-10-15 MED FILL — Triamcinolone Acetonide Oint 0.5%: CUTANEOUS | 7 days supply | Qty: 15 | Fill #0 | Status: AC

## 2020-10-16 ENCOUNTER — Telehealth: Payer: Self-pay | Admitting: Obstetrics and Gynecology

## 2020-10-16 NOTE — Telephone Encounter (Signed)
LM for patient to return call.

## 2020-10-16 NOTE — Telephone Encounter (Signed)
Kristy Hansen called in and states she is still struggling with the yeast infection she saw Dr. Logan Bores about at her last appointment.  Kristy Hansen states she used the medications that were prescribed, monistat, and creams and it is not going away.  Kristy Hansen states she is swollen, itchy, and it is very painful.  Please advise.

## 2020-10-17 ENCOUNTER — Other Ambulatory Visit: Payer: Self-pay | Admitting: Obstetrics and Gynecology

## 2020-10-17 ENCOUNTER — Encounter: Payer: Self-pay | Admitting: Obstetrics and Gynecology

## 2020-10-17 ENCOUNTER — Other Ambulatory Visit: Payer: Self-pay

## 2020-10-17 ENCOUNTER — Ambulatory Visit (INDEPENDENT_AMBULATORY_CARE_PROVIDER_SITE_OTHER): Payer: Self-pay | Admitting: Obstetrics and Gynecology

## 2020-10-17 VITALS — BP 127/86 | HR 77 | Ht 61.25 in | Wt 113.9 lb

## 2020-10-17 DIAGNOSIS — N898 Other specified noninflammatory disorders of vagina: Secondary | ICD-10-CM

## 2020-10-17 DIAGNOSIS — B009 Herpesviral infection, unspecified: Secondary | ICD-10-CM

## 2020-10-17 DIAGNOSIS — L233 Allergic contact dermatitis due to drugs in contact with skin: Secondary | ICD-10-CM

## 2020-10-17 MED ORDER — LIDOCAINE HCL URETHRAL/MUCOSAL 2 % EX GEL: 1.0000 "application " | CUTANEOUS | 0 refills | Status: DC | PRN

## 2020-10-17 NOTE — Progress Notes (Signed)
HPI:      [name] is a 35 y.o. [id number] who LMP was No LMP recorded (lmp unknown).  Subjective:   She presents today stating that she began to use the Monistat cream on her labia and her labia started to improve, but she began to have an increase in itching.  She bought an over-the-counter anti-itch cream which she applied.  Within 30 minutes after application she noticed her labia became significantly swollen and very painful.  She took 2 Benadryl and this helped to resolve the swelling and pain.  She states that she has tried numerous "products" on her labia over the last few days but she continues to have significant pain and burning of her labia.    Hx: The following portions of the patient's history were reviewed and updated as appropriate:             She  has a past medical history of Anxiety, Autoimmune disease (HCC), Chiari malformation type I (HCC), Hematuria, gross, Panic attack, Pyelonephritis, and Renal cyst. She does not have any pertinent problems on file. She  has a past surgical history that includes Myringotomy with tube placement; Cyst removal pediatric; and Spine surgery. Her family history includes Cancer in her mother; Diabetes in her maternal grandmother, maternal uncle, and mother; Hypertension in her mother. She  reports that she has quit smoking. Her smoking use included cigarettes. She has a 7.50 pack-year smoking history. She has never used smokeless tobacco. She reports previous alcohol use. She reports that she does not use drugs. She has a current medication list which includes the following prescription(s): lidocaine, acetaminophen, epipen 2-pak, ferrous sulfate, fluconazole, folic acid, lidocaine, loratadine, metaxalone, phenazopyridine, sulfamethoxazole-trimethoprim, and triamcinolone ointment. She is allergic to penicillins, chantix [varenicline], aspirin, benzonatate, naprosyn [naproxen], and tramadol.       Review of Systems:  Review of  Systems  Constitutional: Denied constitutional symptoms, night sweats, recent illness, fatigue, fever, insomnia and weight loss.  Eyes: Denied eye symptoms, eye pain, photophobia, vision change and visual disturbance.  Ears/Nose/Throat/Neck: Denied ear, nose, throat or neck symptoms, hearing loss, nasal discharge, sinus congestion and sore throat.  Cardiovascular: Denied cardiovascular symptoms, arrhythmia, chest pain/pressure, edema, exercise intolerance, orthopnea and palpitations.  Respiratory: Denied pulmonary symptoms, asthma, pleuritic pain, productive sputum, cough, dyspnea and wheezing.  Gastrointestinal: Denied, gastro-esophageal reflux, melena, nausea and vomiting.  Genitourinary: See HPI for additional information.  Musculoskeletal: Denied musculoskeletal symptoms, stiffness, swelling, muscle weakness and myalgia.  Dermatologic: Denied dermatology symptoms, rash and scar.  Neurologic: Denied neurology symptoms, dizziness, headache, neck pain and syncope.  Psychiatric: Denied psychiatric symptoms, anxiety and depression.  Endocrine: Denied endocrine symptoms including hot flashes and night sweats.   Meds:   Current Outpatient Medications on File Prior to Visit  Medication Sig Dispense Refill  . acetaminophen (TYLENOL) 325 MG tablet Take 650 mg by mouth every 6 (six) hours as needed for headache.    . EPIPEN 2-PAK 0.3 MG/0.3ML SOAJ injection INJECT AS DIRECTED AND AS NEEDED. 2 each 0  . ferrous sulfate 325 (65 FE) MG tablet TAKE ONE TABLET BY MOUTH EVERY DAY WITH BREAKFAST (Patient not taking: Reported on 10/12/2020) 30 tablet 3  . fluconazole (DIFLUCAN) 150 MG tablet Take 1 tablet (150 mg total) by mouth every 3 (three) days. 2 tablet 0  . folic acid (FOLVITE) 1 MG tablet TAKE ONE TABLET BY MOUTH EVERY DAY (Patient not taking: Reported on 10/12/2020) 30 tablet 3  . lidocaine (LIDODERM) 5 % APPLY PATCH  DAILY AS DIRECTED AND LEAVE ON FOR ONLY 12 HOURS THEN REMOVE. 30 patch 0  .  loratadine (CLARITIN) 10 MG tablet Take 10 mg by mouth as needed for allergies.    . metaxalone (SKELAXIN) 800 MG tablet TAKE ONE TABLET BY MOUTH 3 TIMES A DAY (Patient taking differently: Take 800 mg by mouth 3 (three) times daily. Taking 1-2 per day) 90 tablet 3  . phenazopyridine (PYRIDIUM) 100 MG tablet Take 1 tablet (100 mg total) by mouth 3 (three) times daily as needed for pain. (Patient not taking: No sig reported) 10 tablet 0  . sulfamethoxazole-trimethoprim (BACTRIM DS) 800-160 MG tablet TAKE ONE TABLET BY MOUTH 2 TIMES A DAY FOR 7 DAYS (Patient not taking: No sig reported) 14 tablet 0  . triamcinolone ointment (KENALOG) 0.5 % APPLY 1 APPLICATION 2 TIMES A DAY AS DIRECTED (Patient not taking: Reported on 10/12/2020) 30 g 0   No current facility-administered medications on file prior to visit.          Objective:     Vitals:   10/17/20 1119  BP: 127/86  Pulse: 77   Filed Weights   10/17/20 1119  Weight: 113 lb 14.4 oz (51.7 kg)              Examination of the labia reveals some erythema, some exudate present.  Extremely tender to palpation.  No obvious vesicular lesions.  Difficult examination because of patient discomfort.  Assessment:    [id number] Patient Active Problem List   Diagnosis Date Noted  . Acute bilateral knee pain 08/09/2020  . Cold skin 08/09/2020  . Upper respiratory infection, viral 07/19/2020  . Acute bacterial sinusitis 07/19/2020  . Elevated liver enzymes 03/06/2020  . Kidney stone on left side 03/06/2020  . Blurry vision, bilateral 02/08/2020  . Fatigue 02/08/2020  . Cough 12/15/2019  . Chiari malformation type I (HCC) 10/20/2017  . Galactorrhea 06/19/2016  . Irregular menses 06/19/2016  . Renal cyst 01/05/2015  . Paresthesias 12/12/2014  . Family history of ovarian cancer 12/12/2014  . Tobacco user 12/12/2014  . Renal mass 12/05/2014  . Anxiety 10/11/2014  . Back pain, chronic 07/17/2014     1. Vaginal lesion   2. 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 HSV infection     Patient had monilia but has used multiple products on the labia 1 of which I believe caused an allergic reaction causing an acute exacerbation of pain.  Based on her symptoms and the exudate it is possible to be HSV although this would be atypical presentation.   Plan:            1.  HSV testing-cultures performed, blood work at least to just rule this out.  2.  Begin Valtrex  3.  Advised patient not to apply any further products to the labia. -Continue Diflucan  4.  Because of the patient's extreme discomfort-lidocaine jelly given and prescribed to be applied to the labia for comfort measures.  Twice daily sitz bath's discussed. Orders Orders Placed This Encounter  Procedures  . Herpes simplex virus culture  . HSV(herpes smplx)abs-1+2(IgG+IgM)-bld     Meds ordered this encounter  Medications  . lidocaine (XYLOCAINE) 2 % jelly    Sig: Apply 1 application topically as needed.    Dispense:  30 mL    Refill:  0      F/U  No follow-ups on file. I spent 23 minutes involved in the care of this  patient preparing to see the patient by obtaining and reviewing her medical history (including labs, imaging tests and prior procedures), documenting clinical information in the electronic health record (EHR), counseling and coordinating care plans, writing and sending prescriptions, ordering tests or procedures and directly communicating with the patient by discussing pertinent items from her history and physical exam as well as detailing my assessment and plan as noted above so that she has an informed understanding.  All of her questions were answered.  Elonda Husky, M.D. 10/17/2020 11:58 AM

## 2020-10-17 NOTE — Telephone Encounter (Signed)
Patient is coming in to be seen.

## 2020-10-18 ENCOUNTER — Other Ambulatory Visit: Payer: Self-pay

## 2020-10-20 LAB — HERPES SIMPLEX VIRUS CULTURE

## 2020-10-21 LAB — HSV(HERPES SMPLX)ABS-I+II(IGG+IGM)-BLD
HSV 1 Glycoprotein G Ab, IgG: <0.91 index (ref 0.00–0.90)
HSV 2 IgG, Type Spec: <0.91 index (ref 0.00–0.90)
HSVI/II Comb IgM: <0.91 Ratio (ref 0.00–0.90)

## 2020-10-25 ENCOUNTER — Other Ambulatory Visit: Payer: Self-pay

## 2020-10-26 ENCOUNTER — Other Ambulatory Visit: Payer: Self-pay

## 2020-11-08 ENCOUNTER — Ambulatory Visit: Payer: Self-pay | Admitting: Gerontology

## 2020-11-15 ENCOUNTER — Ambulatory Visit: Payer: Medicaid Other | Admitting: Gerontology

## 2020-11-16 ENCOUNTER — Telehealth: Payer: Self-pay | Admitting: Pharmacist

## 2020-11-16 NOTE — Telephone Encounter (Signed)
11/16/2020 9:37:39 AM - Epi-pen refill called to [facility]  -- Rhetta Mura - Friday, November 16, 2020 9:36 AM --Liana Crocker spoke with Sela Hua for refill-allow 5-10 business days for [place] to receive at our clinic.

## 2020-11-28 ENCOUNTER — Other Ambulatory Visit: Payer: Self-pay

## 2020-12-05 ENCOUNTER — Other Ambulatory Visit: Payer: Self-pay

## 2020-12-25 ENCOUNTER — Telehealth: Payer: Self-pay | Admitting: Pharmacist

## 2020-12-25 ENCOUNTER — Other Ambulatory Visit: Payer: Self-pay

## 2020-12-25 NOTE — Telephone Encounter (Signed)
  11/08/2020 8:48:28 AM - Rec'd notice from [employer] Skelaxin No longer on Pap  -- Rhetta Mura - Thursday, November 08, 2020 8:47 AM --Rec'd notice from [employer] that Skelaxin is no longer available with PAP--patient should have received a letter.

## 2020-12-26 ENCOUNTER — Other Ambulatory Visit: Payer: Self-pay | Admitting: Gerontology

## 2020-12-26 ENCOUNTER — Other Ambulatory Visit: Payer: Self-pay

## 2020-12-26 DIAGNOSIS — G8929 Other chronic pain: Secondary | ICD-10-CM

## 2020-12-26 DIAGNOSIS — M549 Dorsalgia, unspecified: Secondary | ICD-10-CM

## 2020-12-26 MED ORDER — METHOCARBAMOL 500 MG PO TABS
500.0000 mg | ORAL_TABLET | Freq: Three times a day (TID) | ORAL | 0 refills | Status: DC
  Filled 2020-12-26: qty 90, 30d supply, fill #0

## 2021-01-21 ENCOUNTER — Other Ambulatory Visit: Payer: Self-pay

## 2021-01-23 ENCOUNTER — Other Ambulatory Visit: Payer: Self-pay

## 2021-01-24 ENCOUNTER — Other Ambulatory Visit: Payer: Self-pay

## 2021-02-04 ENCOUNTER — Other Ambulatory Visit: Payer: Self-pay

## 2021-02-07 ENCOUNTER — Other Ambulatory Visit: Payer: Self-pay | Admitting: Gerontology

## 2021-02-07 ENCOUNTER — Other Ambulatory Visit: Payer: Self-pay

## 2021-02-07 MED ORDER — EPINEPHRINE 0.3 MG/0.3ML IJ SOAJ
INTRAMUSCULAR | 3 refills | Status: DC
  Filled 2021-02-07: qty 2, 60d supply, fill #0

## 2021-02-07 MED ORDER — EPINEPHRINE 0.3 MG/0.3ML IJ SOAJ
INTRAMUSCULAR | 3 refills | Status: AC
  Filled 2021-02-07: qty 2, 30d supply, fill #0

## 2021-02-12 ENCOUNTER — Encounter: Payer: Self-pay | Admitting: Gerontology

## 2021-02-12 ENCOUNTER — Ambulatory Visit: Payer: Medicaid Other | Admitting: Adult Health

## 2021-02-12 VITALS — BP 121/84 | HR 77 | Temp 98.1°F | Ht 61.0 in | Wt 109.5 lb

## 2021-02-12 DIAGNOSIS — R630 Anorexia: Secondary | ICD-10-CM

## 2021-02-12 DIAGNOSIS — R252 Cramp and spasm: Secondary | ICD-10-CM

## 2021-02-12 DIAGNOSIS — Z720 Tobacco use: Secondary | ICD-10-CM

## 2021-02-12 DIAGNOSIS — R140 Abdominal distension (gaseous): Secondary | ICD-10-CM

## 2021-02-12 NOTE — Progress Notes (Signed)
 Patient: Kristy Hansen Female    DOB: [birth year]   35 y.o.   MRN: [id number] Visit Date: 02/12/2021  Today's Provider: Theodora Fish, NP   Chief Complaint  Patient presents with   leg cramps    Both legs. Starting at the knee and travels to toes. Numbness and tingling along with cramping. Comes and goes. Standing makes it works. Ibuprofen helps   Bloated    After eating for 3-4 hours. Can go days without being hungry, feels nauseous but is very hungry.   Subjective:    HPI This is a 35 y/o female who presents for f/u. Reports on/off leg cramps. Sometimes go on all day. Reports trying ibuprofen and muscle relaxants to no avail. Last labs were in March and unremarkable. Also reports bloating after eating and anorexia. She can't identify any specific foods that cause it. Some days are worse than others and some days she eats just fine. She is not loosing weight. She reports regular BMs. No diarrhea, and vomiting but reports nausea. She can't identify any triggers. She has been taking lots of ibuprofen without any significant improvement.  Patient states that she seldom feels hungry. Symptoms are not new. Denies chest pain, palpitations and shortness of breath.   She continues to smoke daily Allergies  Allergen Reactions   Penicillins Anaphylaxis, Shortness Of Breath and Swelling    Has patient had a PCN reaction causing immediate rash, facial/tongue/throat swelling, SOB or lightheadedness with hypotension: Yes Has patient had a PCN reaction causing severe rash involving mucus membranes or skin necrosis: Unknown Has patient had a PCN reaction that required hospitalization: No Has patient had a PCN reaction occurring within the last 10 years: No If all of the above answers are NO, then may proceed with Cephalosporin use.    Chantix  [Varenicline ] Other (See Comments)    Nightmares   Aspirin Hives   Benzonatate      Hallucination   Naprosyn [Naproxen] Hives   Tramadol Hives    Previous Medications   ACETAMINOPHEN  (TYLENOL ) 325 MG TABLET    Take 650 mg by mouth every 6 (six) hours as needed for headache.   EPINEPHRINE  (EPIPEN  2-PAK) 0.3 MG/0.3 ML IJ SOAJ INJECTION    INJECT 0.3MG  INTRAMUSCULARLY AS DIRECTED AS NEEDED FOR ALLERGIC REACTION.   FERROUS SULFATE  325 (65 FE) MG TABLET    TAKE ONE TABLET BY MOUTH EVERY DAY WITH BREAKFAST   FLUCONAZOLE  (DIFLUCAN ) 150 MG TABLET    Take 1 tablet (150 mg total) by mouth every 3 (three) days.   FOLIC ACID  (FOLVITE ) 1 MG TABLET    TAKE ONE TABLET BY MOUTH EVERY DAY   LIDOCAINE  (LIDODERM ) 5 %    APPLY PATCH DAILY AS DIRECTED AND LEAVE ON FOR ONLY 12 HOURS THEN REMOVE.   LIDOCAINE  (XYLOCAINE ) 2 % JELLY    Apply 1 application topically as needed.   LORATADINE  (CLARITIN ) 10 MG TABLET    Take 10 mg by mouth as needed for allergies.   METHOCARBAMOL  (ROBAXIN ) 500 MG TABLET    Take 1 tablet (500 mg total) by mouth 3 (three) times daily.   PHENAZOPYRIDINE  (PYRIDIUM ) 100 MG TABLET    Take 1 tablet (100 mg total) by mouth 3 (three) times daily as needed for pain.   SULFAMETHOXAZOLE -TRIMETHOPRIM  (BACTRIM  DS) 800-160 MG TABLET    TAKE ONE TABLET BY MOUTH 2 TIMES A DAY FOR 7 DAYS   TRIAMCINOLONE  OINTMENT (KENALOG ) 0.5 %    APPLY 1 APPLICATION 2 TIMES A DAY AS  DIRECTED    Review of Systems  Constitutional:  Positive for appetite change (Decreased appetite). Negative for fatigue.  Respiratory: Negative.    Cardiovascular: Negative.   Gastrointestinal: Negative.   Endocrine: Negative.   Musculoskeletal:  Positive for arthralgias.  Skin: Negative.    Social History   Tobacco Use   Smoking status: Former    Packs/day: 0.50    Years: 15.00    Pack years: 7.50    Types: Cigarettes   Smokeless tobacco: Never  Substance Use Topics   Alcohol use: Not Currently    Alcohol/week: 0.0 standard drinks    Comment: ocassionally   Objective:   BP 121/84 (BP Location: Right Arm, Patient Position: Sitting, Cuff Size: Normal)   Pulse 77    Temp 98.1 F (36.7 C)   Ht 5' 1 (1.549 m)   Wt 109 lb 8 oz (49.7 kg)   SpO2 98%   BMI 20.69 kg/m   Physical Exam Vitals and nursing note reviewed.  Constitutional:      Appearance: Normal appearance.   Cardiovascular:     Rate and Rhythm: Normal rate and regular rhythm.     Pulses: Normal pulses.     Heart sounds: Normal heart sounds.  Pulmonary:     Effort: Pulmonary effort is normal.     Breath sounds: Normal breath sounds.  Abdominal:     General: Abdomen is flat. Bowel sounds are normal.     Palpations: Abdomen is soft.   Musculoskeletal:        General: Normal range of motion.     Right lower leg: No edema.     Left lower leg: No edema.     Comments: No calf tenderness   Skin:    General: Skin is warm.     Capillary Refill: Capillary refill takes less than 2 seconds.   Neurological:     General: No focal deficit present.     Mental Status: She is alert and oriented to person, place, and time.     Assessment & Plan:     1. Bilateral leg cramps Unclear etiology. Will check electrolyte levels. No signs  - Comp Met (CMET); Future - Magnesium  - Phosphorus; Future -Leg exercises. -  2. Abdominal bloating -Increase fluids and eat small meals -OTC tums and avoid gas forming food -OTC stool softeners and monitor and report any changes in bowel pattern.  3. Tobacco user -smoking cessation advice given 4. Anorexia -Likely related to bloating  Theodora Fish, NP   [facility]

## 2021-02-13 ENCOUNTER — Other Ambulatory Visit: Payer: Self-pay

## 2021-02-13 ENCOUNTER — Other Ambulatory Visit: Payer: Medicaid Other

## 2021-02-13 DIAGNOSIS — R252 Cramp and spasm: Secondary | ICD-10-CM

## 2021-02-14 LAB — COMPREHENSIVE METABOLIC PANEL
ALT: 8 IU/L (ref 0–32)
AST: 10 IU/L (ref 0–40)
Albumin/Globulin Ratio: 2.2 (ref 1.2–2.2)
Albumin: 4.6 g/dL (ref 3.8–4.8)
Alkaline Phosphatase: 56 IU/L (ref 44–121)
BUN/Creatinine Ratio: 13 (ref 9–23)
BUN: 9 mg/dL (ref 6–20)
Bilirubin Total: 0.5 mg/dL (ref 0.0–1.2)
CO2: 25 mmol/L (ref 20–29)
Calcium: 9.4 mg/dL (ref 8.7–10.2)
Chloride: 100 mmol/L (ref 96–106)
Creatinine, Ser: 0.68 mg/dL (ref 0.57–1.00)
Globulin, Total: 2.1 g/dL (ref 1.5–4.5)
Glucose: 70 mg/dL (ref 65–99)
Potassium: 4.2 mmol/L (ref 3.5–5.2)
Sodium: 139 mmol/L (ref 134–144)
Total Protein: 6.7 g/dL (ref 6.0–8.5)
eGFR: 118 mL/min/{1.73_m2} (ref >59)

## 2021-02-14 LAB — PHOSPHORUS: Phosphorus: 3 mg/dL (ref 3.0–4.3)

## 2021-03-13 ENCOUNTER — Other Ambulatory Visit: Payer: Self-pay

## 2021-04-04 ENCOUNTER — Ambulatory Visit: Payer: Medicaid Other | Admitting: Gerontology

## 2021-04-04 ENCOUNTER — Other Ambulatory Visit: Payer: Self-pay

## 2021-04-04 VITALS — BP 147/110 | HR 82 | Temp 97.9°F | Resp 18 | Ht 61.0 in | Wt 113.8 lb

## 2021-04-04 DIAGNOSIS — K0889 Other specified disorders of teeth and supporting structures: Secondary | ICD-10-CM | POA: Insufficient documentation

## 2021-04-04 MED ORDER — CHLORHEXIDINE GLUCONATE 0.12 % MT SOLN
15.0000 mL | Freq: Two times a day (BID) | OROMUCOSAL | 0 refills | Status: DC
Start: 2021-04-04 — End: 2021-04-18
  Filled 2021-04-04: qty 473, 16d supply, fill #0

## 2021-04-04 NOTE — Progress Notes (Signed)
Established Patient Office Visit  Subjective:  Patient ID: Kristy Hansen, female    DOB: 1985/12/29  Age: 35 y.o. MRN: [id number]  CC:  Chief Complaint  Patient presents with   Dental Pain    Recently seen by dentist to have a tooth pulled Oct. 14th. Dentist told her to go see an ENT or urgent care. Patient has not been able to sleep, mentioned auditory hallucinations second to insomnia.      HPI Kristy Hansen is a 35 y/o female who has history of Anxiety, Chiari malformation type 1,panic attack, presents for c/o worsening dental pain. She states that her left upper wisdom tooth was extracted on 03/22/21 by Dr Katheran James. Since then, she's has been experiencing constant pain to the extraction site that radiates to mastoid bone and jaw. She states that she's being eating only soup, unable to sleep due to excruciating pain. She completed the course of 2 rounds of antibiotics 3 days ago and was told to go to Urgent care  per Dr Katheran James. Per his note, he recommended evaluating  site for sinus infection and follow up with ENT. Overall, she was teary during visit, stating that  her pain is very unbearable.   Past Medical History:  Diagnosis Date   Anxiety    Autoimmune disease (HCC)    Chiari malformation type I (HCC)    Hematuria, gross    Panic attack    Pyelonephritis    Renal cyst     Past Surgical History:  Procedure Laterality Date   CYST REMOVAL PEDIATRIC     Spine   MYRINGOTOMY WITH TUBE PLACEMENT     SPINE SURGERY      Family History  Problem Relation Age of Onset   Cancer Mother        breast cancer, ovarian cancer   Diabetes Mother    Hypertension Mother    Diabetes Maternal Grandmother    Diabetes Maternal Uncle    Kidney disease Neg Hx    Bladder Cancer Neg Hx    Prostate cancer Neg Hx     Social History   Socioeconomic History   Marital status: Legally Separated    Spouse name: Not on file   Number of children: Not on file   Years of education: Not  on file   Highest education level: Not on file  Occupational History   Not on file  Tobacco Use   Smoking status: Former    Packs/day: 0.50    Years: 15.00    Pack years: 7.50    Types: Cigarettes   Smokeless tobacco: Never  Vaping Use   Vaping Use: Every day   Substances: Nicotine, CBD, Flavoring  Substance and Sexual Activity   Alcohol use: Not Currently    Alcohol/week: 0.0 standard drinks    Comment: ocassionally   Drug use: No   Sexual activity: Yes    Partners: Male  Other Topics Concern   Not on file  Social History Narrative   Not on file   Social Determinants of Health   Financial Resource Strain: Not on file  Food Insecurity: Not on file  Transportation Needs: No Transportation Needs   Lack of Transportation (Medical): No   Lack of Transportation (Non-Medical): No  Physical Activity: Not on file  Stress: Not on file  Social Connections: Not on file  Intimate Partner Violence: Not on file    Outpatient Medications Prior to Visit  Medication Sig Dispense Refill   EPINEPHrine (EPIPEN 2-PAK)  0.3 mg/0.3 mL IJ SOAJ injection INJECT 0.3MG  INTRAMUSCULARLY AS DIRECTED AS NEEDED FOR ALLERGIC REACTION. 2 each 3   lidocaine (LIDODERM) 5 % APPLY PATCH DAILY AS DIRECTED AND LEAVE ON FOR ONLY 12 HOURS THEN REMOVE. 30 patch 0   lidocaine (XYLOCAINE) 2 % jelly Apply 1 application topically as needed. 30 mL 0   loratadine (CLARITIN) 10 MG tablet Take 10 mg by mouth as needed for allergies.     metaxalone (SKELAXIN) 800 MG tablet Take 800 mg by mouth 3 (three) times daily.     acetaminophen (TYLENOL) 325 MG tablet Take 650 mg by mouth every 6 (six) hours as needed for headache. (Patient not taking: Reported on 04/04/2021)     ferrous sulfate 325 (65 FE) MG tablet TAKE ONE TABLET BY MOUTH EVERY DAY WITH BREAKFAST (Patient not taking: No sig reported) 30 tablet 3   fluconazole (DIFLUCAN) 150 MG tablet Take 1 tablet (150 mg total) by mouth every 3 (three) days. (Patient not  taking: No sig reported) 2 tablet 0   folic acid (FOLVITE) 1 MG tablet TAKE ONE TABLET BY MOUTH EVERY DAY (Patient not taking: Reported on 10/12/2020) 30 tablet 3   methocarbamol (ROBAXIN) 500 MG tablet Take 1 tablet (500 mg total) by mouth 3 (three) times daily. (Patient not taking: No sig reported) 90 tablet 0   phenazopyridine (PYRIDIUM) 100 MG tablet Take 1 tablet (100 mg total) by mouth 3 (three) times daily as needed for pain. (Patient not taking: No sig reported) 10 tablet 0   sulfamethoxazole-trimethoprim (BACTRIM DS) 800-160 MG tablet TAKE ONE TABLET BY MOUTH 2 TIMES A DAY FOR 7 DAYS (Patient not taking: No sig reported) 14 tablet 0   triamcinolone ointment (KENALOG) 0.5 % APPLY 1 APPLICATION 2 TIMES A DAY AS DIRECTED (Patient not taking: No sig reported) 30 g 0   No facility-administered medications prior to visit.    Allergies  Allergen Reactions   Penicillins Anaphylaxis, Shortness Of Breath and Swelling    Has patient had a PCN reaction causing immediate rash, facial/tongue/throat swelling, SOB or lightheadedness with hypotension: Yes Has patient had a PCN reaction causing severe rash involving mucus membranes or skin necrosis: Unknown Has patient had a PCN reaction that required hospitalization: No Has patient had a PCN reaction occurring within the last 10 years: No If all of the above answers are "NO", then may proceed with Cephalosporin use.    Chantix [Varenicline] Other (See Comments)    Nightmares   Aspirin Hives   Benzonatate     Hallucination   Naprosyn [Naproxen] Hives   Tramadol Hives    ROS Review of Systems  HENT:  Positive for dental problem.   Respiratory: Negative.    Cardiovascular: Negative.      Objective:    Physical Exam HENT:     Head: Normocephalic and atraumatic.     Mouth/Throat:     Dentition: Dental tenderness (left upper tooth extraction site. Site is healing, no erythema, swelling or discharge) present. No gingival swelling, dental  abscesses or gum lesions.  Cardiovascular:     Rate and Rhythm: Normal rate and regular rhythm.     Pulses: Normal pulses.     Heart sounds: Normal heart sounds.  Pulmonary:     Effort: Pulmonary effort is normal.     Breath sounds: Normal breath sounds.  Neurological:     Mental Status: She is alert.    BP (!) 147/110 (BP Location: Left Arm, Patient Position: Sitting, Cuff Size:  Normal)   Pulse 82   Temp 97.9 F (36.6 C)   Resp 18   Ht $R'5\' 1"'Jm$  (1.549 m)   Wt 113 lb 12.8 oz (51.6 kg)   SpO2 100%   BMI 21.50 kg/m  Wt Readings from Last 3 Encounters:  04/04/21 113 lb 12.8 oz (51.6 kg)  02/13/21 110 lb 8 oz (50.1 kg)  02/12/21 109 lb 8 oz (49.7 kg)     Health Maintenance Due  Topic Date Due   COVID-19 Vaccine (1) Never done   Pneumococcal Vaccine 38-15 Years old (1 - PCV) Never done   HIV Screening  Never done   PAP SMEAR-Modifier  07/24/2019   INFLUENZA VACCINE  01/07/2021    There are no preventive care reminders to display for this patient.  Lab Results  Component Value Date   TSH 1.310 11/25/2018   Lab Results  Component Value Date   WBC 5.1 09/05/2020   HGB 12.8 09/05/2020   HCT 37.7 09/05/2020   MCV 88 09/05/2020   PLT 231 09/05/2020   Lab Results  Component Value Date   NA 139 02/13/2021   K 4.2 02/13/2021   CO2 25 02/13/2021   GLUCOSE 70 02/13/2021   BUN 9 02/13/2021   CREATININE 0.68 02/13/2021   BILITOT 0.5 02/13/2021   ALKPHOS 56 02/13/2021   AST 10 02/13/2021   ALT 8 02/13/2021   PROT 6.7 02/13/2021   ALBUMIN 4.6 02/13/2021   CALCIUM 9.4 02/13/2021   ANIONGAP 7 08/11/2020   EGFR 118 02/13/2021   Lab Results  Component Value Date   CHOL 157 11/25/2018   Lab Results  Component Value Date   HDL 47 11/25/2018   Lab Results  Component Value Date   LDLCALC 97 11/25/2018   Lab Results  Component Value Date   TRIG 63 11/25/2018   Lab Results  Component Value Date   CHOLHDL 3.3 11/25/2018   Lab Results  Component Value Date    HGBA1C 5.0 11/25/2018      Assessment & Plan:      1. Pain, dental - No erythema, swelling or discharge from extraction site, was advised to rinse mouth with warn salt water and use Peridex mouth wash. Unable to prescribe Ibuprofen she's allergic to NSAID. Unable to do imaging, she was advised to go to the ED . - Ambulatory referral to ENT - chlorhexidine (PERIDEX) 0.12 % solution; Use as directed 15 mLs in the mouth or throat 2 (two) times daily.  Dispense: 120 mL; Refill: 0 Her blood pressure elevated likely due to pain, she was advised to check daily, record and bring log to follow up appointment.   Follow-up: Return in about 2 weeks (around 04/18/2021), or if symptoms worsen or fail to improve.    Berel Najjar Jerold Coombe, NP

## 2021-04-05 ENCOUNTER — Other Ambulatory Visit: Payer: Self-pay

## 2021-04-18 ENCOUNTER — Ambulatory Visit: Payer: Medicaid Other | Admitting: Gerontology

## 2021-04-18 ENCOUNTER — Other Ambulatory Visit: Payer: Self-pay

## 2021-04-18 ENCOUNTER — Encounter: Payer: Self-pay | Admitting: Gerontology

## 2021-04-18 VITALS — BP 124/85 | HR 89 | Temp 97.5°F | Resp 16 | Ht 61.25 in | Wt 109.6 lb

## 2021-04-18 DIAGNOSIS — K0889 Other specified disorders of teeth and supporting structures: Secondary | ICD-10-CM

## 2021-04-18 NOTE — Progress Notes (Signed)
Established Patient Office Visit  Subjective:  Patient ID: Kristy Hansen, female    DOB: 1985-09-01  Age: 35 y.o. MRN: [id number]  CC:  Chief Complaint  Patient presents with   Follow-up    HPI Kristy Hansen is a 35 y/o female who has history of Anxiety, Chiari malformation type 1,panic attack, presents for Dental pain follow up. She was seen at [facility] for dental pain on 04/05/21, Maxillofacial CT was done and it showed no sign of infection or enhancing fluid collection. Currently, she states that  her intermittent mouth pain has improved to 2/10, denies swelling, erythema. Overall, she states that she's doing well and offers no further complaint.  Past Medical History:  Diagnosis Date   Anxiety    Autoimmune disease ([place])    Chiari malformation type I ([place])    Hematuria, gross    Panic attack    Pyelonephritis    Renal cyst     Past Surgical History:  Procedure Laterality Date   CYST REMOVAL PEDIATRIC     Spine   DENTAL SURGERY     MYRINGOTOMY WITH TUBE PLACEMENT     SPINE SURGERY      Family History  Problem Relation Age of Onset   Cancer Mother        breast cancer, ovarian cancer   Diabetes Mother    Hypertension Mother    Diabetes Maternal Grandmother    Diabetes Maternal Uncle    Kidney disease Neg Hx    Bladder Cancer Neg Hx    Prostate cancer Neg Hx     Social History   Socioeconomic History   Marital status: Legally Separated    Spouse name: Not on file   Number of children: Not on file   Years of education: Not on file   Highest education level: Not on file  Occupational History   Not on file  Tobacco Use   Smoking status: Former    Packs/day: 0.50    Years: 15.00    Pack years: 7.50    Types: Cigarettes    Quit date: 2021    Years since quitting: 1.8   Smokeless tobacco: Never  Vaping Use   Vaping Use: Every day   Substances: Nicotine, Flavoring  Substance and Sexual Activity   Alcohol use: Not Currently    Alcohol/week: 0.0  standard drinks    Comment: ocassionally   Drug use: No   Sexual activity: Yes    Partners: Male  Other Topics Concern   Not on file  Social History Narrative   Not on file   Social Determinants of Health   Financial Resource Strain: Not on file  Food Insecurity: No Food Insecurity   Worried About Running Out of Food in the Last Year: Never true   Ran Out of Food in the Last Year: Never true  Transportation Needs: No Transportation Needs   Lack of Transportation (Medical): No   Lack of Transportation (Non-Medical): No  Physical Activity: Not on file  Stress: Not on file  Social Connections: Not on file  Intimate Partner Violence: Not on file    Outpatient Medications Prior to Visit  Medication Sig Dispense Refill   EPINEPHrine (EPIPEN 2-PAK) 0.3 mg/0.3 mL IJ SOAJ injection INJECT 0.3MG INTRAMUSCULARLY AS DIRECTED AS NEEDED FOR ALLERGIC REACTION. 2 each 3   lidocaine (LIDODERM) 5 % APPLY PATCH DAILY AS DIRECTED AND LEAVE ON FOR ONLY 12 HOURS THEN REMOVE. 30 patch 0   lidocaine (XYLOCAINE) 2 %  jelly Apply 1 application topically as needed. 30 mL 0   loratadine (CLARITIN) 10 MG tablet Take 10 mg by mouth as needed for allergies.     metaxalone (SKELAXIN) 800 MG tablet Take 800 mg by mouth 3 (three) times daily.     acetaminophen (TYLENOL) 325 MG tablet Take 650 mg by mouth every 6 (six) hours as needed for headache. (Patient not taking: No sig reported)     chlorhexidine (PERIDEX) 0.12 % solution Use as directed. Rinse and spit 104m in the mouth or throat 2 (two) times daily. 473 mL 0   ferrous sulfate 325 (65 FE) MG tablet TAKE ONE TABLET BY MOUTH EVERY DAY WITH BREAKFAST 30 tablet 3   fluconazole (DIFLUCAN) 150 MG tablet Take 1 tablet (150 mg total) by mouth every 3 (three) days. 2 tablet 0   folic acid (FOLVITE) 1 MG tablet TAKE ONE TABLET BY MOUTH EVERY DAY 30 tablet 3   methocarbamol (ROBAXIN) 500 MG tablet Take 1 tablet (500 mg total) by mouth 3 (three) times daily. 90 tablet  0   phenazopyridine (PYRIDIUM) 100 MG tablet Take 1 tablet (100 mg total) by mouth 3 (three) times daily as needed for pain. 10 tablet 0   sulfamethoxazole-trimethoprim (BACTRIM DS) 800-160 MG tablet TAKE ONE TABLET BY MOUTH 2 TIMES A DAY FOR 7 DAYS 14 tablet 0   triamcinolone ointment (KENALOG) 0.5 % APPLY 1 APPLICATION 2 TIMES A DAY AS DIRECTED 30 g 0   No facility-administered medications prior to visit.    Allergies  Allergen Reactions   Bee Venom Anaphylaxis   Penicillins Anaphylaxis, Shortness Of Breath and Swelling    Has patient had a PCN reaction causing immediate rash, facial/tongue/throat swelling, SOB or lightheadedness with hypotension: Yes Has patient had a PCN reaction causing severe rash involving mucus membranes or skin necrosis: Unknown Has patient had a PCN reaction that required hospitalization: No Has patient had a PCN reaction occurring within the last 10 years: No If all of the above answers are "NO", then may proceed with Cephalosporin use.    Chantix [Varenicline] Other (See Comments)    Nightmares   Aspirin Hives   Benzonatate     Hallucination   Naprosyn [Naproxen] Hives   Tramadol Hives    ROS Review of Systems  Constitutional: Negative.   HENT:  Positive for dental problem.   Respiratory: Negative.    Cardiovascular: Negative.   Neurological: Negative.      Objective:    Physical Exam Cardiovascular:     Rate and Rhythm: Normal rate and regular rhythm.     Pulses: Normal pulses.     Heart sounds: Normal heart sounds.  Pulmonary:     Effort: Pulmonary effort is normal.     Breath sounds: Normal breath sounds.  Neurological:     General: No focal deficit present.     Mental Status: She is alert and oriented to person, place, and time.    BP 124/85 (BP Location: Left Arm, Patient Position: Sitting, Cuff Size: Normal)   Pulse 89   Temp (!) 97.5 F (36.4 C)   Resp 16   Ht 5' 1.25" (1.556 m)   Wt 109 lb 9.6 oz (49.7 kg)   LMP 03/28/2021  (Approximate)   SpO2 98%   BMI 20.54 kg/m  Wt Readings from Last 3 Encounters:  04/18/21 109 lb 9.6 oz (49.7 kg)  04/04/21 113 lb 12.8 oz (51.6 kg)  02/13/21 110 lb 8 oz (50.1 kg)  Health Maintenance Due  Topic Date Due   COVID-19 Vaccine (1) Never done   Pneumococcal Vaccine 27-80 Years old (1 - PCV) Never done   HIV Screening  Never done   PAP SMEAR-Modifier  07/24/2019   INFLUENZA VACCINE  01/07/2021    There are no preventive care reminders to display for this patient.  Lab Results  Component Value Date   TSH 1.310 11/25/2018   Lab Results  Component Value Date   WBC 5.1 09/05/2020   HGB 12.8 09/05/2020   HCT 37.7 09/05/2020   MCV 88 09/05/2020   PLT 231 09/05/2020   Lab Results  Component Value Date   NA 139 02/13/2021   K 4.2 02/13/2021   CO2 25 02/13/2021   GLUCOSE 70 02/13/2021   BUN 9 02/13/2021   CREATININE 0.68 02/13/2021   BILITOT 0.5 02/13/2021   ALKPHOS 56 02/13/2021   AST 10 02/13/2021   ALT 8 02/13/2021   PROT 6.7 02/13/2021   ALBUMIN 4.6 02/13/2021   CALCIUM 9.4 02/13/2021   ANIONGAP 7 08/11/2020   EGFR 118 02/13/2021   Lab Results  Component Value Date   CHOL 157 11/25/2018   Lab Results  Component Value Date   HDL 47 11/25/2018   Lab Results  Component Value Date   LDLCALC 97 11/25/2018   Lab Results  Component Value Date   TRIG 63 11/25/2018   Lab Results  Component Value Date   CHOLHDL 3.3 11/25/2018   Lab Results  Component Value Date   HGBA1C 5.0 11/25/2018      Assessment & Plan:    1. Pain, dental - She states that pain is improving, was advised to notify Dentist with further concerns or go to the ED.     Follow-up: Return in about 6 months (around 10/16/2021), or if symptoms worsen or fail to improve.    Jessen Siegman Jerold Coombe, NP

## 2021-05-14 ENCOUNTER — Encounter: Payer: Self-pay | Admitting: Gerontology

## 2021-05-14 ENCOUNTER — Other Ambulatory Visit: Payer: Self-pay

## 2021-05-14 ENCOUNTER — Ambulatory Visit: Payer: Medicaid Other | Admitting: Gerontology

## 2021-05-14 VITALS — BP 117/75 | HR 73 | Temp 98.0°F | Resp 16 | Ht 61.25 in | Wt 109.4 lb

## 2021-05-14 DIAGNOSIS — L233 Allergic contact dermatitis due to drugs in contact with skin: Secondary | ICD-10-CM

## 2021-05-14 DIAGNOSIS — G935 Compression of brain: Secondary | ICD-10-CM

## 2021-05-14 MED ORDER — METHOCARBAMOL 500 MG PO TABS
500.0000 mg | ORAL_TABLET | Freq: Three times a day (TID) | ORAL | 1 refills | Status: DC
  Filled 2021-05-14 – 2021-06-25 (×2): qty 90, 30d supply, fill #0

## 2021-05-14 MED ORDER — LIDOCAINE HCL URETHRAL/MUCOSAL 2 % EX GEL: 1.0000 "application " | CUTANEOUS | 5 refills | Status: DC | PRN

## 2021-05-14 NOTE — Progress Notes (Signed)
Established Patient Office Visit  Subjective:  Patient ID: Kristy Hansen, female    DOB: April 02, 1986  Age: 35 y.o. MRN: [id number]  CC:  Chief Complaint  Patient presents with   Follow-up   Medication Refill    Patient requesting refill on Skelaxin    HPI Kristy Hansen is a 35 y/o female who has history of Anxiety, Chiari malformation type 1,panic attack presents for medication refill.  She states that she is compliant with her medication, denies side effects and continues to make healthy lifestyle changes.  She states that she will be out of her Skelaxin and medication management pharmacy will not be able to supply it, and requested to substitute with Robaxin 500 mg 3 times daily.  She states that her chronic back pain has not gotten worse, she denies bowel/bladder incontinence and saddle anesthesia.  Overall, she states that she is doing well and offers no further complaint.  Past Medical History:  Diagnosis Date   Anxiety    Autoimmune disease ([place])    Chiari malformation type I ([place])    Hematuria, gross    Panic attack    Pyelonephritis    Renal cyst     Past Surgical History:  Procedure Laterality Date   CYST REMOVAL PEDIATRIC     Spine   DENTAL SURGERY     MYRINGOTOMY WITH TUBE PLACEMENT     SPINE SURGERY      Family History  Problem Relation Age of Onset   Cancer Mother        breast cancer, ovarian cancer   Diabetes Mother    Hypertension Mother    Healthy Father    Diabetes Maternal Uncle    Diabetes Maternal Grandmother    Other Maternal Grandfather        murdered   Stroke Paternal Grandmother    Other Paternal Grandfather        unknown medical history   Kidney disease Neg Hx    Bladder Cancer Neg Hx    Prostate cancer Neg Hx     Social History   Socioeconomic History   Marital status: Legally Separated    Spouse name: Not on file   Number of children: Not on file   Years of education: Not on file   Highest education level: Not on  file  Occupational History   Not on file  Tobacco Use   Smoking status: Former    Packs/day: 0.50    Years: 15.00    Pack years: 7.50    Types: Cigarettes    Quit date: 2021    Years since quitting: 1.9   Smokeless tobacco: Never  Vaping Use   Vaping Use: Every day   Substances: Nicotine, Flavoring  Substance and Sexual Activity   Alcohol use: Not Currently    Alcohol/week: 0.0 standard drinks    Comment: ocassionally   Drug use: No   Sexual activity: Yes    Partners: Male  Other Topics Concern   Not on file  Social History Narrative   Not on file   Social Determinants of Health   Financial Resource Strain: Not on file  Food Insecurity: No Food Insecurity   Worried About Running Out of Food in the Last Year: Never true   Ran Out of Food in the Last Year: Never true  Transportation Needs: No Transportation Needs   Lack of Transportation (Medical): No   Lack of Transportation (Non-Medical): No  Physical Activity: Not on file  Stress:  Not on file  Social Connections: Not on file  Intimate Partner Violence: Not on file    Outpatient Medications Prior to Visit  Medication Sig Dispense Refill   EPINEPHrine (EPIPEN 2-PAK) 0.3 mg/0.3 mL IJ SOAJ injection INJECT 0.3MG INTRAMUSCULARLY AS DIRECTED AS NEEDED FOR ALLERGIC REACTION. 2 each 3   ibuprofen (ADVIL) 200 MG tablet Take 400 mg by mouth every 6 (six) hours as needed for headache.     loratadine (CLARITIN) 10 MG tablet Take 10 mg by mouth as needed for allergies.     lidocaine (XYLOCAINE) 2 % jelly Apply 1 application topically as needed. 30 mL 0   metaxalone (SKELAXIN) 800 MG tablet Take 800 mg by mouth 3 (three) times daily.     No facility-administered medications prior to visit.    Allergies  Allergen Reactions   Bee Venom Anaphylaxis   Penicillins Anaphylaxis, Shortness Of Breath and Swelling    Has patient had a PCN reaction causing immediate rash, facial/tongue/throat swelling, SOB or lightheadedness with  hypotension: Yes Has patient had a PCN reaction causing severe rash involving mucus membranes or skin necrosis: Unknown Has patient had a PCN reaction that required hospitalization: No Has patient had a PCN reaction occurring within the last 10 years: No If all of the above answers are "NO", then may proceed with Cephalosporin use.    Chantix [Varenicline] Other (See Comments)    Nightmares   Aspirin Hives   Benzonatate     Hallucination Tessalon pearls given me hallucination   Naprosyn [Naproxen] Hives   Tramadol Hives    ROS Review of Systems  Constitutional: Negative.   Respiratory: Negative.    Cardiovascular: Negative.   Musculoskeletal:  Positive for back pain (chronic pain).  Neurological: Negative.      Objective:    Physical Exam HENT:     Head: Normocephalic and atraumatic.  Cardiovascular:     Rate and Rhythm: Normal rate and regular rhythm.     Pulses: Normal pulses.     Heart sounds: Normal heart sounds.  Pulmonary:     Effort: Pulmonary effort is normal.     Breath sounds: Normal breath sounds.  Musculoskeletal:        General: Normal range of motion.  Neurological:     General: No focal deficit present.     Mental Status: She is alert and oriented to person, place, and time. Mental status is at baseline.    BP 117/75 (BP Location: Right Arm, Patient Position: Sitting, Cuff Size: Normal)   Pulse 73   Temp 98 F (36.7 C) (Oral)   Resp 16   Ht 5' 1.25" (1.556 m)   Wt 109 lb 6.4 oz (49.6 kg)   LMP 04/23/2021 (Approximate)   SpO2 99%   BMI 20.50 kg/m  Wt Readings from Last 3 Encounters:  05/14/21 109 lb 6.4 oz (49.6 kg)  04/18/21 109 lb 9.6 oz (49.7 kg)  04/04/21 113 lb 12.8 oz (51.6 kg)     Health Maintenance Due  Topic Date Due   COVID-19 Vaccine (1) Never done   HIV Screening  Never done   PAP SMEAR-Modifier  07/24/2019    There are no preventive care reminders to display for this patient.  Lab Results  Component Value Date   TSH  1.310 11/25/2018   Lab Results  Component Value Date   WBC 5.1 09/05/2020   HGB 12.8 09/05/2020   HCT 37.7 09/05/2020   MCV 88 09/05/2020   PLT 231 09/05/2020  Lab Results  Component Value Date   NA 139 02/13/2021   K 4.2 02/13/2021   CO2 25 02/13/2021   GLUCOSE 70 02/13/2021   BUN 9 02/13/2021   CREATININE 0.68 02/13/2021   BILITOT 0.5 02/13/2021   ALKPHOS 56 02/13/2021   AST 10 02/13/2021   ALT 8 02/13/2021   PROT 6.7 02/13/2021   ALBUMIN 4.6 02/13/2021   CALCIUM 9.4 02/13/2021   ANIONGAP 7 08/11/2020   EGFR 118 02/13/2021   Lab Results  Component Value Date   CHOL 157 11/25/2018   Lab Results  Component Value Date   HDL 47 11/25/2018   Lab Results  Component Value Date   LDLCALC 97 11/25/2018   Lab Results  Component Value Date   TRIG 63 11/25/2018   Lab Results  Component Value Date   CHOLHDL 3.3 11/25/2018   Lab Results  Component Value Date   HGBA1C 5.0 11/25/2018      Assessment & Plan:     1. Chiari malformation type I ([place]) -Her chronic back pain is stable, she was started on Robaxin, was educated on medication side effects, advised to notify clinic or go to the emergency room.  She will continue applying lidocaine jelly to her back as needed.  She was advised to go to the emergency room for worsening symptoms. - methocarbamol (ROBAXIN) 500 MG tablet; Take 1 tablet (500 mg total) by mouth 3 (three) times daily.  Dispense: 90 tablet; Refill: 1 -  lidocaine (XYLOCAINE) 2 % jelly; Apply 1 application topically as needed.  Dispense: 30 mL; Refill: 5   Follow-up: Return in about 13 weeks (around 08/13/2021), or if symptoms worsen or fail to improve.    Kristy Rollo Jerold Coombe, NP

## 2021-05-27 ENCOUNTER — Other Ambulatory Visit: Payer: Self-pay

## 2021-06-17 ENCOUNTER — Other Ambulatory Visit: Payer: Self-pay

## 2021-06-24 ENCOUNTER — Other Ambulatory Visit: Payer: Self-pay

## 2021-06-25 ENCOUNTER — Other Ambulatory Visit: Payer: Self-pay

## 2021-07-08 ENCOUNTER — Other Ambulatory Visit: Payer: Self-pay

## 2021-07-24 ENCOUNTER — Other Ambulatory Visit: Payer: Self-pay

## 2021-08-06 ENCOUNTER — Ambulatory Visit: Payer: Medicaid Other | Admitting: Gerontology

## 2021-08-06 ENCOUNTER — Other Ambulatory Visit: Payer: Self-pay

## 2021-08-06 ENCOUNTER — Encounter: Payer: Self-pay | Admitting: Gerontology

## 2021-08-06 VITALS — BP 130/90 | HR 85 | Temp 98.0°F | Resp 16 | Ht 61.25 in | Wt 115.0 lb

## 2021-08-06 DIAGNOSIS — R42 Dizziness and giddiness: Secondary | ICD-10-CM

## 2021-08-06 DIAGNOSIS — R5383 Other fatigue: Secondary | ICD-10-CM

## 2021-08-06 NOTE — Progress Notes (Signed)
Established Patient Office Visit  Subjective:  Patient ID: Kristy Hansen, female    DOB: 03-16-1986  Age: 36 y.o. MRN: [id number]  CC:  Chief Complaint  Patient presents with   Follow-up    HPI Kristy Hansen is a 36 y/o female who has history of Anxiety, Chiari malformation type 1,panic attack presents for medication refill.presents for c/o soreness to chest and shoulder. She states that she had generalized rash to her body last week that lasted 6 days. She went to Urgent care and was started on Prednisone and otc Zyrtec. She denies changing her detergent, laundry soap. But currently, she states that rash has resolved, but continues to feel tired, light headed and exhausted. She also c/o  constant soreness to her chest area and shoulder has being going on for 2 days. She states that pain is 6/10 and describes pain as aching sensation. She reports taking tylenol and ibuprofen with minimal relief. She also states that her throat is scratchy and thirsty, and intermittent acid reflux feeling that's not associated with eating or drinking.that has being going on for 10 days. She denies cough, shortness of breath and chest tightness. She has not done Covid test, and overall, she states that she's not feeling well and offers no further complaint.    Past Medical History:  Diagnosis Date   Anxiety    Autoimmune disease ([place])    Chiari malformation type I ([place])    Hematuria, gross    Panic attack    Pyelonephritis    Renal cyst     Past Surgical History:  Procedure Laterality Date   CYST REMOVAL PEDIATRIC     Spine   DENTAL SURGERY     MYRINGOTOMY WITH TUBE PLACEMENT     SPINE SURGERY      Family History  Problem Relation Age of Onset   Cancer Mother        breast cancer, ovarian cancer   Diabetes Mother    Hypertension Mother    Healthy Father    Diabetes Maternal Uncle    Diabetes Maternal Grandmother    Other Maternal Grandfather        murdered   Stroke Paternal  Grandmother    Other Paternal Grandfather        unknown medical history   Kidney disease Neg Hx    Bladder Cancer Neg Hx    Prostate cancer Neg Hx     Social History   Socioeconomic History   Marital status: Legally Separated    Spouse name: Not on file   Number of children: Not on file   Years of education: Not on file   Highest education level: Not on file  Occupational History   Not on file  Tobacco Use   Smoking status: Former    Packs/day: 0.50    Years: 15.00    Pack years: 7.50    Types: Cigarettes    Quit date: 2021    Years since quitting: 2.1   Smokeless tobacco: Never  Vaping Use   Vaping Use: Every day   Substances: Nicotine, Flavoring  Substance and Sexual Activity   Alcohol use: Not Currently    Alcohol/week: 0.0 standard drinks    Comment: ocassionally   Drug use: No   Sexual activity: Yes    Partners: Male  Other Topics Concern   Not on file  Social History Narrative   Not on file   Social Determinants of Health   Financial Resource Strain: Not  on file  Food Insecurity: No Food Insecurity   Worried About Charity fundraiser in the Last Year: Never true   Ran Out of Food in the Last Year: Never true  Transportation Needs: No Transportation Needs   Lack of Transportation (Medical): No   Lack of Transportation (Non-Medical): No  Physical Activity: Not on file  Stress: Not on file  Social Connections: Not on file  Intimate Partner Violence: Not on file    Outpatient Medications Prior to Visit  Medication Sig Dispense Refill   cetirizine (ZYRTEC) 10 MG tablet Take 10 mg by mouth daily.     EPINEPHrine (EPIPEN 2-PAK) 0.3 mg/0.3 mL IJ SOAJ injection INJECT 0.3MG INTRAMUSCULARLY AS DIRECTED AS NEEDED FOR ALLERGIC REACTION. 2 each 3   ibuprofen (ADVIL) 200 MG tablet Take 400 mg by mouth every 6 (six) hours as needed for headache.     lidocaine (XYLOCAINE) 2 % jelly Apply 1 application topically as needed. 30 mL 5   methocarbamol (ROBAXIN) 500 MG  tablet Take 1 tablet (500 mg total) by mouth 3 (three) times daily. 90 tablet 1   loratadine (CLARITIN) 10 MG tablet Take 10 mg by mouth as needed for allergies.     No facility-administered medications prior to visit.    Allergies  Allergen Reactions   Bee Venom Anaphylaxis   Penicillins Anaphylaxis, Shortness Of Breath and Swelling    Has patient had a PCN reaction causing immediate rash, facial/tongue/throat swelling, SOB or lightheadedness with hypotension: Yes Has patient had a PCN reaction causing severe rash involving mucus membranes or skin necrosis: Unknown Has patient had a PCN reaction that required hospitalization: No Has patient had a PCN reaction occurring within the last 10 years: No If all of the above answers are "NO", then may proceed with Cephalosporin use.    Chantix [Varenicline] Other (See Comments)    Nightmares   Aspirin Hives   Benzonatate     Hallucination Tessalon pearls given me hallucination   Naprosyn [Naproxen] Hives   Tramadol Hives    ROS Review of Systems  Constitutional:  Positive for fatigue.  HENT:  Positive for sore throat.   Respiratory: Negative.    Cardiovascular: Negative.   Skin: Negative.   Neurological:  Positive for light-headedness.  Psychiatric/Behavioral: Negative.       Objective:    Physical Exam HENT:     Head: Normocephalic and atraumatic.     Mouth/Throat:     Mouth: Mucous membranes are moist.  Eyes:     Extraocular Movements: Extraocular movements intact.     Conjunctiva/sclera: Conjunctivae normal.     Pupils: Pupils are equal, round, and reactive to light.  Cardiovascular:     Rate and Rhythm: Normal rate and regular rhythm.     Pulses: Normal pulses.     Heart sounds: Normal heart sounds.  Pulmonary:     Effort: Pulmonary effort is normal.     Breath sounds: Normal breath sounds.  Skin:    General: Skin is warm.  Neurological:     General: No focal deficit present.     Mental Status: She is alert and  oriented to person, place, and time. Mental status is at baseline.  Psychiatric:        Mood and Affect: Mood normal.        Behavior: Behavior normal.        Thought Content: Thought content normal.        Judgment: Judgment normal.    BP  130/90 (BP Location: Right Arm, Patient Position: Sitting, Cuff Size: Normal)    Pulse 85    Temp 98 F (36.7 C) (Oral)    Resp 16    Ht 5' 1.25" (1.556 m)    Wt 115 lb (52.2 kg)    LMP 07/10/2021 (Approximate)    SpO2 98%    BMI 21.55 kg/m  Wt Readings from Last 3 Encounters:  08/06/21 115 lb (52.2 kg)  05/14/21 109 lb 6.4 oz (49.6 kg)  04/18/21 109 lb 9.6 oz (49.7 kg)     Health Maintenance Due  Topic Date Due   COVID-19 Vaccine (1) Never done   HIV Screening  Never done   PAP SMEAR-Modifier  07/24/2019    There are no preventive care reminders to display for this patient.  Lab Results  Component Value Date   TSH 1.310 11/25/2018   Lab Results  Component Value Date   WBC 9.5 08/06/2021   HGB 13.9 08/06/2021   HCT 40.9 08/06/2021   MCV 90 08/06/2021   PLT 303 08/06/2021   Lab Results  Component Value Date   NA 142 08/06/2021   K 4.5 08/06/2021   CO2 28 08/06/2021   GLUCOSE 88 08/06/2021   BUN 12 08/06/2021   CREATININE 0.59 08/06/2021   BILITOT 0.5 02/13/2021   ALKPHOS 56 02/13/2021   AST 10 02/13/2021   ALT 8 02/13/2021   PROT 6.7 02/13/2021   ALBUMIN 4.6 02/13/2021   CALCIUM 9.5 08/06/2021   ANIONGAP 7 08/11/2020   EGFR 122 08/06/2021   Lab Results  Component Value Date   CHOL 157 11/25/2018   Lab Results  Component Value Date   HDL 47 11/25/2018   Lab Results  Component Value Date   LDLCALC 97 11/25/2018   Lab Results  Component Value Date   TRIG 63 11/25/2018   Lab Results  Component Value Date   CHOLHDL 3.3 11/25/2018   Lab Results  Component Value Date   HGBA1C 5.0 11/25/2018      Assessment & Plan:      1. Fatigue, unspecified type - Labs will be checked to rule in anemia, or vitamin D  deficiency. - CBC w/Diff; Future - Vitamin D (25 hydroxy); Future - Basic Metabolic Panel (BMET); Future - Basic Metabolic Panel (BMET) - Vitamin D (25 hydroxy) - CBC w/Diff  2. Light headedness - She was advised to increase fluid intake, notify clinic and go to the ED for worsening symptoms. Also to change position slowly   Follow-up: Return in about 2 months (around 10/16/2021), or if symptoms worsen or fail to improve.    Zeb Rawl Jerold Coombe, NP

## 2021-08-07 ENCOUNTER — Telehealth: Payer: Self-pay | Admitting: Emergency Medicine

## 2021-08-07 LAB — CBC WITH DIFFERENTIAL/PLATELET
Basophils Absolute: 0 10*3/uL (ref 0.0–0.2)
Basos: 0 %
EOS (ABSOLUTE): 0.1 10*3/uL (ref 0.0–0.4)
Eos: 1 %
Hematocrit: 40.9 % (ref 34.0–46.6)
Hemoglobin: 13.9 g/dL (ref 11.1–15.9)
Immature Grans (Abs): 0.1 10*3/uL (ref 0.0–0.1)
Immature Granulocytes: 1 %
Lymphocytes Absolute: 2.3 10*3/uL (ref 0.7–3.1)
Lymphs: 24 %
MCH: 30.5 pg (ref 26.6–33.0)
MCHC: 34 g/dL (ref 31.5–35.7)
MCV: 90 fL (ref 79–97)
Monocytes Absolute: 0.7 10*3/uL (ref 0.1–0.9)
Monocytes: 7 %
Neutrophils Absolute: 6.3 10*3/uL (ref 1.4–7.0)
Neutrophils: 67 %
Platelets: 303 10*3/uL (ref 150–450)
RBC: 4.55 x10E6/uL (ref 3.77–5.28)
RDW: 11.8 % (ref 11.7–15.4)
WBC: 9.5 10*3/uL (ref 3.4–10.8)

## 2021-08-07 LAB — BASIC METABOLIC PANEL
BUN/Creatinine Ratio: 20 (ref 9–23)
BUN: 12 mg/dL (ref 6–20)
CO2: 28 mmol/L (ref 20–29)
Calcium: 9.5 mg/dL (ref 8.7–10.2)
Chloride: 100 mmol/L (ref 96–106)
Creatinine, Ser: 0.59 mg/dL (ref 0.57–1.00)
Glucose: 88 mg/dL (ref 70–99)
Potassium: 4.5 mmol/L (ref 3.5–5.2)
Sodium: 142 mmol/L (ref 134–144)
eGFR: 122 mL/min/{1.73_m2} (ref >59)

## 2021-08-07 LAB — VITAMIN D 25 HYDROXY (VIT D DEFICIENCY, FRACTURES): Vit D, 25-Hydroxy: 31.7 ng/mL (ref 30.0–100.0)

## 2021-08-07 NOTE — Telephone Encounter (Signed)
Notified patient of normal lab results from yesterday (08/06/21) per Lanora Manis, NP. Patient agreed and voiced understanding. ?

## 2021-08-13 ENCOUNTER — Ambulatory Visit: Payer: Medicaid Other | Admitting: Gerontology

## 2021-08-19 ENCOUNTER — Other Ambulatory Visit: Payer: Self-pay

## 2021-09-26 ENCOUNTER — Other Ambulatory Visit: Payer: Self-pay

## 2021-10-03 ENCOUNTER — Telehealth: Payer: Self-pay | Admitting: Pharmacy Technician

## 2021-10-03 ENCOUNTER — Other Ambulatory Visit: Payer: Self-pay

## 2021-10-03 NOTE — Telephone Encounter (Signed)
DOH documentation provided. ? ?Kristy Hansen ?Care Manager ?[facility] ?

## 2021-10-10 ENCOUNTER — Other Ambulatory Visit: Payer: Self-pay

## 2021-10-16 ENCOUNTER — Ambulatory Visit: Payer: Medicaid Other | Admitting: Gerontology

## 2021-10-22 ENCOUNTER — Encounter: Payer: Self-pay | Admitting: Gerontology

## 2021-10-22 ENCOUNTER — Other Ambulatory Visit: Payer: Self-pay

## 2021-10-22 ENCOUNTER — Ambulatory Visit: Payer: Medicaid Other | Admitting: Gerontology

## 2021-10-22 VITALS — BP 129/87 | HR 90 | Temp 97.7°F | Resp 16 | Ht 61.25 in | Wt 117.3 lb

## 2021-10-22 DIAGNOSIS — N898 Other specified noninflammatory disorders of vagina: Secondary | ICD-10-CM | POA: Insufficient documentation

## 2021-10-22 DIAGNOSIS — R350 Frequency of micturition: Secondary | ICD-10-CM

## 2021-10-22 LAB — POCT URINALYSIS DIPSTICK
Bilirubin, UA: NEGATIVE
Blood, UA: NEGATIVE
Glucose, UA: NEGATIVE
Nitrite, UA: NEGATIVE
Protein, UA: POSITIVE — AB
Spec Grav, UA: 1.02 (ref 1.010–1.025)
Urobilinogen, UA: 1 E.U./dL
pH, UA: 7.5 (ref 5.0–8.0)

## 2021-10-22 NOTE — Progress Notes (Signed)
? ?Established Patient Office Visit ? ?Subjective   ?Patient ID: Kristy Hansen, female    DOB: 1986-05-21  Age: 36 y.o. MRN: [id number] ? ?Chief Complaint  ?Patient presents with  ? Urinary Frequency  ?  Patient c/o urinary frequency x 3 weeks.  ? Vaginal Discharge  ?  Patient c/o vaginal discharge x 3 weeks. Patient denies vaginal itching. Patient self treated with OTC yeast medication.  ? ? ?HPI ?Kristy Hansen is a 36 y/o female who has history of Anxiety, Chiari malformation type 1,panic attack presents for c/o urinary frequency and lower abdominal cramps, that started 3 weeks ago.She denies flank pain, dysuria, fever and chills. She states that she treated herself with OTC anti yeast medication but continues to exeprience whitish vaginal discharge with odor. She denies itching, and not sexually active. Overall, she states that she's doing well and offers no further complaint. ? ? ?Review of Systems  ?Constitutional: Negative.   ?Respiratory: Negative.    ?Cardiovascular: Negative.   ?Genitourinary:  Positive for frequency. Negative for dysuria, flank pain and urgency.  ?     Vaginal discharge  ? ?  ?Objective:  ?  ? ?BP 129/87 (BP Location: Right Arm, Patient Position: Sitting, Cuff Size: Normal)   Pulse 90   Temp 97.7 ?F (36.5 ?C) (Oral)   Resp 16   Ht 5' 1.25" (1.556 m)   Wt 117 lb 4.8 oz (53.2 kg)   LMP 10/01/2021 (Approximate)   SpO2 99%   BMI 21.98 kg/m?  ?BP Readings from Last 3 Encounters:  ?10/22/21 129/87  ?08/06/21 130/90  ?05/14/21 117/75  ? ?Wt Readings from Last 3 Encounters:  ?10/22/21 117 lb 4.8 oz (53.2 kg)  ?08/06/21 115 lb (52.2 kg)  ?05/14/21 109 lb 6.4 oz (49.6 kg)  ? ?  ? ?Physical Exam ? ? ?Results for orders placed or performed in visit on 10/22/21  ?POCT Urinalysis Dipstick  ?Result Value Ref Range  ? Color, UA yellow   ? Clarity, UA clear   ? Glucose, UA Negative Negative  ? Bilirubin, UA neg   ? Ketones, UA trace   ? Spec Grav, UA 1.020 1.010 - 1.025  ? Blood, UA neg   ?  pH, UA 7.5 5.0 - 8.0  ? Protein, UA Positive (A) Negative  ? Urobilinogen, UA 1.0 0.2 or 1.0 E.U./dL  ? Nitrite, UA neg   ? Leukocytes, UA Trace (A) Negative  ? Appearance    ? Odor    ? ? ?Last CBC ?Lab Results  ?Component Value Date  ? WBC 9.5 08/06/2021  ? HGB 13.9 08/06/2021  ? HCT 40.9 08/06/2021  ? MCV 90 08/06/2021  ? MCH 30.5 08/06/2021  ? RDW 11.8 08/06/2021  ? PLT 303 08/06/2021  ? ?Last metabolic panel ?Lab Results  ?Component Value Date  ? GLUCOSE 88 08/06/2021  ? NA 142 08/06/2021  ? K 4.5 08/06/2021  ? CL 100 08/06/2021  ? CO2 28 08/06/2021  ? BUN 12 08/06/2021  ? CREATININE 0.59 08/06/2021  ? EGFR 122 08/06/2021  ? CALCIUM 9.5 08/06/2021  ? PHOS 3.0 02/13/2021  ? PROT 6.7 02/13/2021  ? ALBUMIN 4.6 02/13/2021  ? LABGLOB 2.1 02/13/2021  ? AGRATIO 2.2 02/13/2021  ? BILITOT 0.5 02/13/2021  ? ALKPHOS 56 02/13/2021  ? AST 10 02/13/2021  ? ALT 8 02/13/2021  ? ANIONGAP 7 08/11/2020  ? ?Last lipids ?Lab Results  ?Component Value Date  ? CHOL 157 11/25/2018  ? HDL 47 11/25/2018  ?  [place] 97 11/25/2018  ? TRIG 63 11/25/2018  ? CHOLHDL 3.3 11/25/2018  ? ?Last hemoglobin A1c ?Lab Results  ?Component Value Date  ? HGBA1C 5.0 11/25/2018  ? ?Last thyroid functions ?Lab Results  ?Component Value Date  ? TSH 1.310 11/25/2018  ? ?  ? ?The ASCVD Risk score (Arnett DK, et al., 2019) failed to calculate for the following reasons: ?  The 2019 ASCVD risk score is only valid for ages 62 to 90 ? ?  ?Assessment & Plan:  ? ?1. Urinary frequency ?- Urine was negative for UTI, will send out for culture. She was encouraged to perform proper perineal hygiene and increase water intake. ?- POCT Urinalysis Dipstick ?- UA/M w/rflx Culture, Routine; Future ?- UA/M w/rflx Culture, Routine ? ?2. Vaginal discharge ?- Swab was obtained, and she also scheduled an appointment with the [facility] Department. ?- NuSwab BV and Candida, NAA; Future ?- NuSwab BV and Candida, NAA ? ? ? ?Return in about 30 days (around 11/21/2021), or if symptoms worsen  or fail to improve.  ? ? ?Kamron Vanwyhe Jerold Coombe, NP ? ?

## 2021-10-24 ENCOUNTER — Ambulatory Visit: Payer: Medicaid Other

## 2021-10-24 LAB — NUSWAB BV AND CANDIDA, NAA
Candida albicans, NAA: NEGATIVE
Candida glabrata, NAA: NEGATIVE

## 2021-11-06 LAB — UA/M W/RFLX CULTURE, ROUTINE

## 2021-11-12 ENCOUNTER — Other Ambulatory Visit: Payer: Self-pay

## 2021-11-21 ENCOUNTER — Ambulatory Visit: Payer: Medicaid Other | Admitting: Gerontology

## 2021-11-26 ENCOUNTER — Other Ambulatory Visit: Payer: Self-pay

## 2021-11-27 ENCOUNTER — Other Ambulatory Visit: Payer: Self-pay | Admitting: Gerontology

## 2021-11-27 ENCOUNTER — Other Ambulatory Visit: Payer: Self-pay

## 2021-11-27 DIAGNOSIS — G935 Compression of brain: Secondary | ICD-10-CM

## 2021-11-27 MED ORDER — METAXALONE 800 MG PO TABS
800.0000 mg | ORAL_TABLET | Freq: Three times a day (TID) | ORAL | 1 refills | Status: DC
  Filled 2021-11-27: qty 90, 30d supply, fill #0

## 2021-11-28 ENCOUNTER — Other Ambulatory Visit: Payer: Self-pay

## 2021-12-11 ENCOUNTER — Other Ambulatory Visit: Payer: Self-pay

## 2021-12-30 ENCOUNTER — Other Ambulatory Visit: Payer: Self-pay

## 2022-06-05 ENCOUNTER — Other Ambulatory Visit: Payer: Self-pay

## 2022-06-12 ENCOUNTER — Ambulatory Visit (INDEPENDENT_AMBULATORY_CARE_PROVIDER_SITE_OTHER): Payer: Self-pay | Admitting: Obstetrics & Gynecology

## 2022-06-12 DIAGNOSIS — N9089 Other specified noninflammatory disorders of vulva and perineum: Secondary | ICD-10-CM

## 2022-06-12 DIAGNOSIS — R102 Pelvic and perineal pain: Secondary | ICD-10-CM

## 2022-06-12 DIAGNOSIS — L233 Allergic contact dermatitis due to drugs in contact with skin: Secondary | ICD-10-CM

## 2022-06-12 MED ORDER — VALACYCLOVIR HCL 1 G PO TABS: 1000.0000 mg | ORAL_TABLET | Freq: Two times a day (BID) | ORAL | 5 refills | Status: DC

## 2022-06-12 NOTE — Progress Notes (Signed)
   Established Patient Office Visit  Subjective   Patient ID: Kristy Hansen, female    DOB: 1985-11-15  Age: 37 y.o. MRN: [id number]  Chief Complaint  Patient presents with   Vaginitis    HPI    37 yo monogamous P4 is here for a work in visit due to a very painful "rash" on her labia and around her clitoris. This occurred previously in 2019. 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   She has lidocaine gel at home.  She uses withdrawal for contraception.  She reports a normal pap smear in 2022. Objective:     There were no vitals taken for this visit.   Physical Exam   Well nourished, well hydrated White female, no apparent distress She is ambulating and conversing normally. Upper portion of labia and periclitorally have erosions c/w HSV.  Assessment & Plan:   Problem List Items Addressed This Visit   None  HSV- valtrex prescribed with refills to be used prn. No follow-ups on file.    Emily Filbert, MD

## 2022-06-12 NOTE — Progress Notes (Signed)
Patient here today for vaginal rawness after using the wrong razor. She states she is not sexually active, but has very sensitive skin and since she used the wrong razor, its "raw and burns". She has tried OTC ointments with no relief.

## 2022-06-13 ENCOUNTER — Telehealth: Payer: Self-pay

## 2022-06-13 MED ORDER — LIDOCAINE HCL URETHRAL/MUCOSAL 2 % EX GEL: 1.0000 | CUTANEOUS | 5 refills | Status: DC | PRN

## 2022-06-13 NOTE — Addendum Note (Signed)
Addended by: Emily Filbert on: 06/13/2022 12:31 PM   Modules accepted: Orders

## 2022-06-13 NOTE — Telephone Encounter (Signed)
Kristy Hansen called triage stating she forgot to ask Dr. Hulan Fray yesterday to refill her Lidocaine [facility]  Dr. Hulan Fray can you refill that?

## 2022-06-17 ENCOUNTER — Telehealth: Payer: Self-pay

## 2022-06-17 ENCOUNTER — Other Ambulatory Visit: Payer: Self-pay

## 2022-06-17 DIAGNOSIS — L233 Allergic contact dermatitis due to drugs in contact with skin: Secondary | ICD-10-CM

## 2022-06-17 MED ORDER — LIDOCAINE HCL URETHRAL/MUCOSAL 2 % EX GEL: 1.0000 | CUTANEOUS | 5 refills | Status: DC | PRN

## 2022-06-17 MED ORDER — LIDOCAINE HCL URETHRAL/MUCOSAL 2 % EX GEL
1.0000 | CUTANEOUS | 5 refills | Status: DC | PRN
  Filled 2022-06-17: qty 30, fill #0

## 2022-06-17 NOTE — Telephone Encounter (Signed)
Pt calling for refill of her 2% Lidocaine.  [phone]  Adv pt rx was sent yesterday but the system didn't let it go thru to the pharm; I'm trying to get it to go thru now; pt to check with pharm later today and has 5 refills.  Pt very thankful.

## 2022-06-17 NOTE — Telephone Encounter (Signed)
Pt called stating her pharm did not receive rx.  Called pt to verify pharm; [facility]; unable to get rx to go thru electonically; [facility] and gave verbal order.

## 2022-06-26 ENCOUNTER — Ambulatory Visit (INDEPENDENT_AMBULATORY_CARE_PROVIDER_SITE_OTHER): Payer: Self-pay | Admitting: Obstetrics and Gynecology

## 2022-06-26 ENCOUNTER — Encounter: Payer: Self-pay | Admitting: Obstetrics and Gynecology

## 2022-06-26 VITALS — BP 133/93 | HR 79 | Ht 61.25 in | Wt 115.8 lb

## 2022-06-26 DIAGNOSIS — N9089 Other specified noninflammatory disorders of vulva and perineum: Secondary | ICD-10-CM

## 2022-06-26 MED ORDER — FLUCONAZOLE 150 MG PO TABS: 150.0000 mg | ORAL_TABLET | ORAL | 0 refills | Status: DC

## 2022-06-26 MED ORDER — TRIPLE ANTIBIOTIC 5-400-5000 EX OINT: TOPICAL_OINTMENT | Freq: Four times a day (QID) | CUTANEOUS | 0 refills | Status: DC

## 2022-06-26 NOTE — Progress Notes (Signed)
Patient presents today due to ongoing vaginal pain. She states taking valtrex over the past 16 days with no change in pain or discomfort. No new concerns.

## 2022-06-26 NOTE — Progress Notes (Signed)
HPI:      Kristy Hansen is a 37 y.o. [id number] who LMP was Patient's last menstrual period was 06/21/2022 (approximate).  Subjective:   She presents today stating that her vulvar lesions are no better.  She still has significant itching and burning.  She is unable to stop scratching during the night.  She has been taking her antiviral medication as directed.  It has now been approximately 3 weeks since this lesion has been present.    Hx: The following portions of the patient's history were reviewed and updated as appropriate:             She  has a past medical history of Anxiety, Autoimmune disease ([place]), Chiari malformation type I ([place]), Hematuria, gross, Panic attack, Pyelonephritis, and Renal cyst. She does not have any pertinent problems on file. She  has a past surgical history that includes Myringotomy with tube placement; Cyst removal pediatric; Spine surgery; and Dental surgery. Her family history includes Cancer in her mother; Diabetes in her maternal grandmother, maternal uncle, and mother; Healthy in her father; Hypertension in her mother; Other in her maternal grandfather and paternal grandfather; Stroke in her paternal grandmother. She  reports that she quit smoking about 3 years ago. Her smoking use included cigarettes. She has a 7.50 pack-year smoking history. She has never used smokeless tobacco. She reports that she does not currently use alcohol. She reports that she does not use drugs. She has a current medication list which includes the following prescription(s): cetirizine, epinephrine, fluconazole, ibuprofen, lidocaine, metaxalone, neomycin-bacitracin-polymyxin, UNABLE TO FIND, and valacyclovir. She is allergic to aspirin, bee venom, naproxen, penicillins, tramadol, chantix [varenicline], and benzonatate.       Review of Systems:  Review of Systems  Constitutional: Denied constitutional symptoms, night sweats, recent illness, fatigue, fever, insomnia and weight loss.   Eyes: Denied eye symptoms, eye pain, photophobia, vision change and visual disturbance.  Ears/Nose/Throat/Neck: Denied ear, nose, throat or neck symptoms, hearing loss, nasal discharge, sinus congestion and sore throat.  Cardiovascular: Denied cardiovascular symptoms, arrhythmia, chest pain/pressure, edema, exercise intolerance, orthopnea and palpitations.  Respiratory: Denied pulmonary symptoms, asthma, pleuritic pain, productive sputum, cough, dyspnea and wheezing.  Gastrointestinal: Denied, gastro-esophageal reflux, melena, nausea and vomiting.  Genitourinary: See HPI for additional information.  Musculoskeletal: Denied musculoskeletal symptoms, stiffness, swelling, muscle weakness and myalgia.  Dermatologic: Denied dermatology symptoms, rash and scar.  Neurologic: Denied neurology symptoms, dizziness, headache, neck pain and syncope.  Psychiatric: Denied psychiatric symptoms, anxiety and depression.  Endocrine: Denied endocrine symptoms including hot flashes and night sweats.   Meds:   Current Outpatient Medications on File Prior to Visit  Medication Sig Dispense Refill   cetirizine (ZYRTEC) 10 MG tablet Take 10 mg by mouth daily.     EPINEPHrine (EPIPEN 2-PAK) 0.3 mg/0.3 mL IJ SOAJ injection INJECT 0.3MG  INTRAMUSCULARLY AS DIRECTED AS NEEDED FOR ALLERGIC REACTION. 2 each 3   ibuprofen (ADVIL) 200 MG tablet Take 400 mg by mouth every 6 (six) hours as needed for headache.     lidocaine (XYLOCAINE) 2 % jelly Apply 1 Application topically as needed. 30 mL 5   metaxalone (SKELAXIN) 800 MG tablet Take 1 tablet (800 mg total) by mouth 3 (three) times daily. 90 tablet 1   UNABLE TO FIND CBD gummies qhs prn back pain     valACYclovir (VALTREX) 1000 MG tablet Take 1 tablet (1,000 mg total) by mouth 2 (two) times daily. Take for ten days. 20 tablet 5   No current  facility-administered medications on file prior to visit.      Objective:     Vitals:   06/26/22 0931  BP: (!) 133/93   Pulse: 79   Filed Weights   06/26/22 0931  Weight: 115 lb 12.8 oz (52.5 kg)              Physical examination   Pelvic:   Vulva: Erythematous, inflamed, excoriated raw areas, bilateral upper labia, clitoral hood.  Vagina: No lesions or abnormalities noted.  Support: Normal pelvic support.  Urethra No masses tenderness or scarring.  Meatus Normal size without lesions or prolapse.     Anus: Normal exam.  No lesions.  Perineum: Normal exam.  No lesions.             Assessment:    [id number] Patient Active Problem List   Diagnosis Date Noted   Vaginal discharge 10/22/2021   Light headedness 08/06/2021   Pain, dental 04/04/2021   Acute bilateral knee pain 08/09/2020   Cold skin 08/09/2020   Upper respiratory infection, viral 07/19/2020   Acute bacterial sinusitis 07/19/2020   Elevated liver enzymes 03/06/2020   Kidney stone on left side 03/06/2020   Blurry vision, bilateral 02/08/2020   Fatigue 02/08/2020   Cough 12/15/2019   Chiari malformation type I ([place]) 10/20/2017   Galactorrhea 06/19/2016   Irregular menses 06/19/2016   Renal cyst 01/05/2015   Paresthesias 12/12/2014   Family history of ovarian cancer 12/12/2014   Tobacco user 12/12/2014   Renal mass 12/05/2014   Anxiety 10/11/2014   Back pain, chronic 07/17/2014     1. Vulvar lesion     Patient with significant vulvar symptoms with inflammation and excoriation.   Possible yeast versus bacterial skin infection.   Plan:            1.  Diflucan for 6 days  2.  Triple antibiotic cream Orders No orders of the defined types were placed in this encounter.    Meds ordered this encounter  Medications   neomycin-bacitracin-polymyxin (NEOSPORIN) [phone] ointment    Sig: Apply topically 4 (four) times daily.    Dispense:  28.3 g    Refill:  0   fluconazole (DIFLUCAN) 150 MG tablet    Sig: Take 1 tablet (150 mg total) by mouth every other day. For three doses    Dispense:  3 tablet    Refill:  0       F/U  Return for Pt to contact us if symptoms worsen. I spent 22 minutes involved in the care of this patient preparing to see the patient by obtaining and reviewing her medical history (including labs, imaging tests and prior procedures), documenting clinical information in the electronic health record (EHR), counseling and coordinating care plans, writing and sending prescriptions, ordering tests or procedures and in direct communicating with the patient and medical staff discussing pertinent items from her history and physical exam.  Finis Bud, M.D. 06/26/2022 10:14 AM

## 2022-08-07 ENCOUNTER — Ambulatory Visit: Payer: Medicaid Other | Admitting: Gerontology

## 2022-08-07 ENCOUNTER — Other Ambulatory Visit: Payer: Self-pay

## 2022-08-07 ENCOUNTER — Encounter: Payer: Self-pay | Admitting: Gerontology

## 2022-08-07 VITALS — BP 130/90 | HR 82 | Temp 97.8°F | Resp 16 | Ht 61.25 in | Wt 112.9 lb

## 2022-08-07 DIAGNOSIS — G935 Compression of brain: Secondary | ICD-10-CM

## 2022-08-07 MED ORDER — METAXALONE 800 MG PO TABS: 800.0000 mg | ORAL_TABLET | Freq: Three times a day (TID) | ORAL | 1 refills | Status: DC

## 2022-08-07 NOTE — Progress Notes (Signed)
Established Patient Office Visit  Subjective   Patient ID: Kristy Hansen, female    DOB: 29-Nov-1985  Age: 37 y.o. MRN: [id number]  Chief Complaint  Patient presents with   Follow-up   Medication Refill    HPI  Kristy Hansen is a 37 y/o female who has history of Anxiety, Chiari malformation type 1,panic attack  presents for follow up visit and medication refill. She states that she's compliant with her medications, unable to tolerated Flexerill for her Chiari malformation type 1. She states that she continues to experience constant dull pain to back and neck that's chronic and taking Metaxalone relieves her symptoms. She was seen by the ObGyn  Dr Amalia Hailey on 06/26/22 for possible yeast versus bacterial skin infection. She reports that her symptoms has resolved with taking Diflucan and applying triple antibiotic cream. Overall, she states that she's doing well and offers no further complaint.  Review of Systems  Constitutional: Negative.   Respiratory: Negative.    Cardiovascular: Negative.   Musculoskeletal:  Positive for back pain (chronic due to Chiari Malformation) and neck pain.  Neurological: Negative.   Psychiatric/Behavioral: Negative.        Objective:     BP (!) 130/90 (BP Location: Left Arm, Patient Position: Sitting, Cuff Size: Normal)   Pulse 82   Temp 97.8 F (36.6 C) (Oral)   Resp 16   Ht 5' 1.25" (1.556 m)   Wt 112 lb 14.4 oz (51.2 kg)   LMP 07/21/2022 (Approximate)   SpO2 99%   BMI 21.16 kg/m  BP Readings from Last 3 Encounters:  08/07/22 (!) 130/90  06/26/22 (!) 133/93  10/22/21 129/87   Wt Readings from Last 3 Encounters:  08/07/22 112 lb 14.4 oz (51.2 kg)  06/26/22 115 lb 12.8 oz (52.5 kg)  10/22/21 117 lb 4.8 oz (53.2 kg)      Physical Exam HENT:     Head: Normocephalic and atraumatic.  Cardiovascular:     Rate and Rhythm: Normal rate and regular rhythm.     Pulses: Normal pulses.     Heart sounds: Normal heart sounds.  Pulmonary:      Effort: Pulmonary effort is normal.     Breath sounds: Normal breath sounds.  Musculoskeletal:        General: Normal range of motion.  Skin:    General: Skin is warm.  Neurological:     General: No focal deficit present.     Mental Status: She is alert and oriented to person, place, and time. Mental status is at baseline.  Psychiatric:        Mood and Affect: Mood normal.        Behavior: Behavior normal.        Thought Content: Thought content normal.        Judgment: Judgment normal.      No results found for any visits on 08/07/22.  Last CBC Lab Results  Component Value Date   WBC 9.5 08/06/2021   HGB 13.9 08/06/2021   HCT 40.9 08/06/2021   MCV 90 08/06/2021   MCH 30.5 08/06/2021   RDW 11.8 08/06/2021   PLT 303 [id number]   Last metabolic panel Lab Results  Component Value Date   GLUCOSE 88 08/06/2021   NA 142 08/06/2021   K 4.5 08/06/2021   CL 100 08/06/2021   CO2 28 08/06/2021   BUN 12 08/06/2021   CREATININE 0.59 08/06/2021   EGFR 122 08/06/2021   CALCIUM 9.5 08/06/2021  PHOS 3.0 02/13/2021   PROT 6.7 02/13/2021   ALBUMIN 4.6 02/13/2021   LABGLOB 2.1 02/13/2021   AGRATIO 2.2 02/13/2021   BILITOT 0.5 02/13/2021   ALKPHOS 56 02/13/2021   AST 10 02/13/2021   ALT 8 02/13/2021   ANIONGAP 7 08/11/2020   Last lipids Lab Results  Component Value Date   CHOL 157 11/25/2018   HDL 47 11/25/2018   LDLCALC 97 11/25/2018   TRIG 63 11/25/2018   CHOLHDL 3.3 11/25/2018   Last hemoglobin A1c Lab Results  Component Value Date   HGBA1C 5.0 11/25/2018   Last thyroid functions Lab Results  Component Value Date   TSH 1.310 11/25/2018      The ASCVD Risk score (Arnett DK, et al., 2019) failed to calculate for the following reasons:   The 2019 ASCVD risk score is only valid for ages 59 to 57    Assessment & Plan:   1. Chiari malformation type I ([place]) - Her back and neck pain are under control with taking Skelaxin, she will continue current  medication. - metaxalone (SKELAXIN) 800 MG tablet; Take 1 tablet (800 mg total) by mouth 3 (three) times daily.  Dispense: 90 tablet; Refill: 1   Return if symptoms worsen or fail to improve. She has no follow up appointment because her Medicaid is active. [place] wishes her well with her care.   Koray Soter Jerold Coombe, NP

## 2022-09-09 ENCOUNTER — Encounter: Payer: Self-pay | Admitting: Nurse Practitioner

## 2022-09-09 ENCOUNTER — Ambulatory Visit: Payer: Medicaid Other | Admitting: Nurse Practitioner

## 2022-09-09 VITALS — BP 120/70 | HR 81 | Temp 98.4°F | Ht 61.25 in | Wt 115.2 lb

## 2022-09-09 DIAGNOSIS — Z1231 Encounter for screening mammogram for malignant neoplasm of breast: Secondary | ICD-10-CM

## 2022-09-09 DIAGNOSIS — Z0000 Encounter for general adult medical examination without abnormal findings: Secondary | ICD-10-CM | POA: Diagnosis not present

## 2022-09-09 DIAGNOSIS — Z803 Family history of malignant neoplasm of breast: Secondary | ICD-10-CM | POA: Diagnosis not present

## 2022-09-09 DIAGNOSIS — Z1329 Encounter for screening for other suspected endocrine disorder: Secondary | ICD-10-CM

## 2022-09-09 DIAGNOSIS — Z833 Family history of diabetes mellitus: Secondary | ICD-10-CM

## 2022-09-09 DIAGNOSIS — Z13220 Encounter for screening for lipoid disorders: Secondary | ICD-10-CM

## 2022-09-09 DIAGNOSIS — G935 Compression of brain: Secondary | ICD-10-CM

## 2022-09-09 MED ORDER — METAXALONE 800 MG PO TABS: 800.0000 mg | ORAL_TABLET | Freq: Three times a day (TID) | ORAL | 3 refills | Status: DC

## 2022-09-09 NOTE — Progress Notes (Unsigned)
Tomasita Morrow, NP-C Phone: [phone]  Kristy Hansen is a 37 y.o. female who presents today to establish care.   Diet: *** Exercise: *** Pap smear: *** Family history-  Colon cancer: ***  Breast cancer: ***  Ovarian cancer: *** Menses: *** Sexually active: *** Vaccines-   Flu: ***  Tetanus: ***  COVID19: *** HIV screening: *** Hep C Screening: *** Tobacco use: *** Alcohol use: *** Illicit Drug use: *** Dentist: *** Ophthalmology: ***   Active Ambulatory Problems    Diagnosis Date Noted   Anxiety 10/11/2014   Back pain, chronic 07/17/2014   Renal mass 12/05/2014   Paresthesias 12/12/2014   Family history of ovarian cancer 12/12/2014   Tobacco user 12/12/2014   Renal cyst 01/05/2015   Galactorrhea 06/19/2016   Irregular menses 06/19/2016   Chiari malformation type I 10/20/2017   Cough 12/15/2019   Blurry vision, bilateral 02/08/2020   Fatigue 02/08/2020   Elevated liver enzymes 03/06/2020   Kidney stone on left side 03/06/2020   Upper respiratory infection, viral 07/19/2020   Acute bacterial sinusitis 07/19/2020   Acute bilateral knee pain 08/09/2020   Cold skin 08/09/2020   Pain, dental 04/04/2021   Light headedness 08/06/2021   Vaginal discharge 10/22/2021   Resolved Ambulatory Problems    Diagnosis Date Noted   Gross hematuria 12/05/2014   Uterine tenderness 06/19/2016   Pelvic pain 06/19/2016   Past Medical History:  Diagnosis Date   Autoimmune disease ([place])    Hematuria, gross    Panic attack    Pyelonephritis     Family History  Problem Relation Age of Onset   Cancer Mother        breast cancer, ovarian cancer   Diabetes Mother    Hypertension Mother    Healthy Father    Diabetes Maternal Uncle    Diabetes Maternal Grandmother    Other Maternal Grandfather        murdered   Stroke Paternal Grandmother    Other Paternal Grandfather        unknown medical history   Kidney disease Neg Hx    Bladder Cancer Neg Hx    Prostate  cancer Neg Hx     Social History   Socioeconomic History   Marital status: Legally Separated    Spouse name: Not on file   Number of children: Not on file   Years of education: Not on file   Highest education level: Not on file  Occupational History   Not on file  Tobacco Use   Smoking status: Former    Packs/day: 0.50    Years: 15.00    Additional pack years: 0.00    Total pack years: 7.50    Types: Cigarettes    Quit date: 2021    Years since quitting: 3.2   Smokeless tobacco: Never  Vaping Use   Vaping Use: Every day   Substances: Nicotine, Flavoring  Substance and Sexual Activity   Alcohol use: Not Currently    Alcohol/week: 0.0 standard drinks of alcohol    Comment: ocassionally   Drug use: No   Sexual activity: Not Currently    Partners: Male    Birth control/protection: None  Other Topics Concern   Not on file  Social History Narrative   Not on file   Social Determinants of Health   Financial Resource Strain: Low Risk  (01/19/2018)   Overall Financial Resource Strain (CARDIA)    Difficulty of Paying Living Expenses: Not hard at all  Food  Insecurity: No Food Insecurity (04/18/2021)   Hunger Vital Sign    Worried About Running Out of Food in the Last Year: Never true    Ran Out of Food in the Last Year: Never true  Transportation Needs: No Transportation Needs (02/12/2021)   PRAPARE - Hydrologist (Medical): No    Lack of Transportation (Non-Medical): No  Physical Activity: Sufficiently Active (01/19/2018)   Exercise Vital Sign    Days of Exercise per Week: 5 days    Minutes of Exercise per Session: 50 min  Stress: Stress Concern Present (01/19/2018)   [place]    Feeling of Stress : To some extent  Social Connections: Somewhat Isolated (01/19/2018)   Social Connection and Isolation Panel [NHANES]    Frequency of Communication with Friends and Family: More than  three times a week    Frequency of Social Gatherings with Friends and Family: More than three times a week    Attends Religious Services: More than 4 times per year    Active Member of [employer] or Organizations: No    Attends Archivist Meetings: Never    Marital Status: Divorced  Human resources officer Violence: Unknown (01/19/2018)   Humiliation, Afraid, Rape, and Kick questionnaire    Fear of Current or Ex-Partner: Patient declined    Emotionally Abused: Patient declined    Physically Abused: Patient declined    Sexually Abused: Patient declined    ROS  General:  Negative for nexplained weight loss, fever Skin: Negative for new or changing mole, sore that won't heal HEENT: Negative for trouble hearing, trouble seeing, ringing in ears, mouth sores, hoarseness, change in voice, dysphagia. CV:  Negative for chest pain, dyspnea, edema, palpitations Resp: Negative for cough, dyspnea, hemoptysis GI: Negative for nausea, vomiting, diarrhea, constipation, abdominal pain, melena, hematochezia. GU: Negative for dysuria, incontinence, urinary hesitance, hematuria, vaginal or penile discharge, polyuria, sexual difficulty, lumps in testicle or breasts MSK: Negative for muscle cramps or aches, joint pain or swelling Neuro: Negative for headaches, weakness, numbness, dizziness, passing out/fainting Psych: Negative for depression, anxiety, memory problems  Objective  Physical Exam There were no vitals filed for this visit.  BP Readings from Last 3 Encounters:  08/07/22 (!) 130/90  06/26/22 (!) 133/93  10/22/21 129/87   Wt Readings from Last 3 Encounters:  08/07/22 112 lb 14.4 oz (51.2 kg)  06/26/22 115 lb 12.8 oz (52.5 kg)  10/22/21 117 lb 4.8 oz (53.2 kg)    Physical Exam   Assessment/Plan:   There are no diagnoses linked to this encounter.  No follow-ups on file.   Tomasita Morrow, NP-C [place]

## 2022-09-10 LAB — CBC WITH DIFFERENTIAL/PLATELET
Basophils Absolute: 0 10*3/uL (ref 0.0–0.1)
Basophils Relative: 0.7 % (ref 0.0–3.0)
Eosinophils Absolute: 0.1 10*3/uL (ref 0.0–0.7)
Eosinophils Relative: 2.1 % (ref 0.0–5.0)
HCT: 36.7 % (ref 36.0–46.0)
Hemoglobin: 12.2 g/dL (ref 12.0–15.0)
Lymphocytes Relative: 20 % (ref 12.0–46.0)
Lymphs Abs: 1.4 10*3/uL (ref 0.7–4.0)
MCHC: 33.1 g/dL (ref 30.0–36.0)
MCV: 88.1 fl (ref 78.0–100.0)
Monocytes Absolute: 0.4 10*3/uL (ref 0.1–1.0)
Monocytes Relative: 6.4 % (ref 3.0–12.0)
Neutro Abs: 5 10*3/uL (ref 1.4–7.7)
Neutrophils Relative %: 70.8 % (ref 43.0–77.0)
Platelets: 259 10*3/uL (ref 150.0–400.0)
RBC: 4.17 Mil/uL (ref 3.87–5.11)
RDW: 13.1 % (ref 11.5–15.5)
WBC: 7 10*3/uL (ref 4.0–10.5)

## 2022-09-10 LAB — COMPREHENSIVE METABOLIC PANEL
ALT: 11 U/L (ref 0–35)
AST: 14 U/L (ref 0–37)
Albumin: 4.4 g/dL (ref 3.5–5.2)
Alkaline Phosphatase: 45 U/L (ref 39–117)
BUN: 13 mg/dL (ref 6–23)
CO2: 29 mEq/L (ref 19–32)
Calcium: 9.1 mg/dL (ref 8.4–10.5)
Chloride: 104 mEq/L (ref 96–112)
Creatinine, Ser: 0.6 mg/dL (ref 0.40–1.20)
GFR: 116.89 mL/min (ref >60.00)
Glucose, Bld: 78 mg/dL (ref 70–99)
Potassium: 4 mEq/L (ref 3.5–5.1)
Sodium: 138 mEq/L (ref 135–145)
Total Bilirubin: 0.5 mg/dL (ref 0.2–1.2)
Total Protein: 6.3 g/dL (ref 6.0–8.3)

## 2022-09-10 LAB — LIPID PANEL
Cholesterol: 175 mg/dL (ref 0–200)
HDL: 50 mg/dL (ref >39.00)
LDL Cholesterol: 112 mg/dL — ABNORMAL HIGH (ref 0–99)
NonHDL: 124.5
Total CHOL/HDL Ratio: 3
Triglycerides: 65 mg/dL (ref 0.0–149.0)
VLDL: 13 mg/dL (ref 0.0–40.0)

## 2022-09-10 LAB — HEMOGLOBIN A1C: Hgb A1c MFr Bld: 5.3 % (ref 4.6–6.5)

## 2022-09-10 LAB — VITAMIN D 25 HYDROXY (VIT D DEFICIENCY, FRACTURES): VITD: 31.47 ng/mL (ref 30.00–100.00)

## 2022-09-10 LAB — TSH: TSH: 0.77 u[IU]/mL (ref 0.35–5.50)

## 2022-09-10 NOTE — Assessment & Plan Note (Signed)
Reports mother was diagnosed at age 35 and has had multiple recurrences. Patient has never had a mammogram. Order placed. Encouraged patient to call and schedule. Counseled on importance of screening mammograms and increased risk of breast cancer due to family history.

## 2022-09-11 ENCOUNTER — Encounter: Payer: Self-pay | Admitting: Nurse Practitioner

## 2022-09-11 NOTE — Assessment & Plan Note (Signed)
Physical exam complete. Lab work as outlined. Will contact patient with results. Pap- due! Encouraged patient to contact her Ob-Gyn to schedule. Declined flu and COVID vaccines. Tetanus vaccine- UTD. Deferred HIV screening. Counseled no vaping/smoking cessation. Recommended annual follow ups with Dentist and Ophthalmology. Encouraged healthy diet and exercise. Discussed decreasing caffeine and fast food intake.

## 2022-09-11 NOTE — Patient Instructions (Signed)
YOUR MAMMOGRAM IS DUE, PLEASE CALL AND GET THIS SCHEDULED! [facility] - call [phone]    

## 2022-09-11 NOTE — Assessment & Plan Note (Signed)
Chronic. Stable on Metaxalone. Symptoms well controlled. Continue. Refills sent. Encouraged patient to contact if symptoms are changing or worsening. Will continue to monitor.

## 2022-10-01 ENCOUNTER — Ambulatory Visit
Admission: RE | Admit: 2022-10-01 | Discharge: 2022-10-01 | Disposition: A | Discharge status: Home / Self Care | Payer: Medicaid Other | Source: Ambulatory Visit | Attending: Nurse Practitioner | Admitting: Nurse Practitioner

## 2022-10-01 DIAGNOSIS — Z1231 Encounter for screening mammogram for malignant neoplasm of breast: Secondary | ICD-10-CM | POA: Insufficient documentation

## 2022-10-01 DIAGNOSIS — Z803 Family history of malignant neoplasm of breast: Secondary | ICD-10-CM | POA: Diagnosis present

## 2022-10-02 ENCOUNTER — Other Ambulatory Visit: Payer: Self-pay | Admitting: Nurse Practitioner

## 2022-10-02 DIAGNOSIS — N6489 Other specified disorders of breast: Secondary | ICD-10-CM

## 2022-10-02 DIAGNOSIS — R928 Other abnormal and inconclusive findings on diagnostic imaging of breast: Secondary | ICD-10-CM

## 2022-10-09 ENCOUNTER — Ambulatory Visit
Admission: RE | Admit: 2022-10-09 | Discharge: 2022-10-09 | Disposition: A | Discharge status: Home / Self Care | Payer: Medicaid Other | Source: Ambulatory Visit | Attending: Nurse Practitioner | Admitting: Nurse Practitioner

## 2022-10-09 DIAGNOSIS — N6489 Other specified disorders of breast: Secondary | ICD-10-CM | POA: Insufficient documentation

## 2022-10-09 DIAGNOSIS — R928 Other abnormal and inconclusive findings on diagnostic imaging of breast: Secondary | ICD-10-CM | POA: Diagnosis present

## 2022-10-24 ENCOUNTER — Other Ambulatory Visit: Payer: Self-pay

## 2022-11-05 ENCOUNTER — Ambulatory Visit: Payer: Medicaid Other | Admitting: Nurse Practitioner

## 2022-11-05 ENCOUNTER — Encounter: Payer: Self-pay | Admitting: Nurse Practitioner

## 2022-11-05 VITALS — BP 118/68 | HR 72 | Temp 98.3°F | Ht 61.25 in | Wt 115.0 lb

## 2022-11-05 DIAGNOSIS — F4323 Adjustment disorder with mixed anxiety and depressed mood: Secondary | ICD-10-CM | POA: Diagnosis not present

## 2022-11-05 MED ORDER — HYDROXYZINE PAMOATE 25 MG PO CAPS
25.0000 mg | ORAL_CAPSULE | Freq: Three times a day (TID) | ORAL | 2 refills | Status: DC | PRN
Start: 2022-11-05 — End: 2022-12-09

## 2022-11-05 MED ORDER — SERTRALINE HCL 25 MG PO TABS
25.0000 mg | ORAL_TABLET | Freq: Every day | ORAL | 0 refills | Status: DC
Start: 2022-11-05 — End: 2022-12-26

## 2022-11-05 NOTE — Progress Notes (Unsigned)
Bethanie Dicker, NP-C Phone: [phone]  Kristy Hansen is a 37 y.o. female who presents today for anxiety and depression.  Patient reports worsening anxiety and depression symptoms over the last several months. She has had decreased energy and little motivation to do things. She has been very emotional and tearful. She feels that she is more irritable. She is having difficulty sleeping. She used to see a therapist but quit approximately 8 months ago due to cost. She is a single mother and her children are getting ready to spend the summer with their father, she has been going to court regarding their custody. She has tried Lexapro in the past and was unable to tolerate it. She is open to trying something else. She would like a referral for a new therapist. PHQ- 7 and GAD- 7. Denies SI/HI.   Social History   Tobacco Use  Smoking Status Former   Packs/day: 0.50   Years: 15.00   Additional pack years: 0.00   Total pack years: 7.50   Types: Cigarettes   Quit date: 2021   Years since quitting: 3.4  Smokeless Tobacco Never    Current Outpatient Medications on File Prior to Visit  Medication Sig Dispense Refill   cetirizine (ZYRTEC) 10 MG tablet Take 10 mg by mouth daily.     EPINEPHrine (EPIPEN 2-PAK) 0.3 mg/0.3 mL IJ SOAJ injection INJECT 0.3MG  INTRAMUSCULARLY AS DIRECTED AS NEEDED FOR ALLERGIC REACTION. 2 each 3   ibuprofen (ADVIL) 200 MG tablet Take 400 mg by mouth every 6 (six) hours as needed for headache.     lidocaine (XYLOCAINE) 2 % jelly Apply 1 Application topically as needed. 30 mL 5   metaxalone (SKELAXIN) 800 MG tablet Take 1 tablet (800 mg total) by mouth 3 (three) times daily. 90 tablet 3   No current facility-administered medications on file prior to visit.     ROS see history of present illness  Objective  Physical Exam Vitals:   11/05/22 0836  BP: 118/68  Pulse: 72  Temp: 98.3 F (36.8 C)  SpO2: 99%    BP Readings from Last 3 Encounters:  11/05/22  118/68  09/09/22 120/70  08/07/22 (!) 130/90   Wt Readings from Last 3 Encounters:  11/05/22 115 lb (52.2 kg)  09/09/22 115 lb 3.2 oz (52.3 kg)  08/07/22 112 lb 14.4 oz (51.2 kg)    Physical Exam Constitutional:      General: She is not in acute distress.    Appearance: Normal appearance.  HENT:     Head: Normocephalic.  Cardiovascular:     Rate and Rhythm: Normal rate and regular rhythm.     Heart sounds: Normal heart sounds.  Pulmonary:     Effort: Pulmonary effort is normal.     Breath sounds: Normal breath sounds.  Skin:    General: Skin is warm and dry.  Neurological:     General: No focal deficit present.     Mental Status: She is alert.  Psychiatric:        Mood and Affect: Mood is anxious. Affect is tearful.        Behavior: Behavior normal.    Assessment/Plan: Please see individual problem list.  Adjustment disorder with mixed anxiety and depressed mood Assessment & Plan: Worsening symptoms recently due to increased life stressors. Will trial patient on Zoloft 25 mg daily and Hydroxyzine 25 mg PRN/at bedtime. Counseled patient on common side effects. Encouraged to contact if worsening symptoms, unusual behavior changes or suicidal thoughts occur.  Referral placed for patient to establish with a new therapist. Will continue to monitor.   Orders: -     Sertraline HCl; Take 1 tablet (25 mg total) by mouth daily.  Dispense: 90 tablet; Refill: 0 -     hydrOXYzine Pamoate; Take 1 capsule (25 mg total) by mouth every 8 (eight) hours as needed for anxiety.  Dispense: 30 capsule; Refill: 2 -     Ambulatory referral to Psychology    Return in about 4 weeks (around 12/03/2022) for Anxiety/Depression.   Bethanie Dicker, NP-C [facility] - [employer]

## 2022-11-06 NOTE — Assessment & Plan Note (Signed)
Worsening symptoms recently due to increased life stressors. Will trial patient on Zoloft 25 mg daily and Hydroxyzine 25 mg PRN/at bedtime. Counseled patient on common side effects. Encouraged to contact if worsening symptoms, unusual behavior changes or suicidal thoughts occur. Referral placed for patient to establish with a new therapist. Will continue to monitor.

## 2022-12-03 ENCOUNTER — Encounter: Payer: Self-pay | Admitting: Nurse Practitioner

## 2022-12-03 ENCOUNTER — Ambulatory Visit: Payer: Medicaid Other | Admitting: Nurse Practitioner

## 2022-12-03 VITALS — BP 110/64 | HR 78 | Temp 98.2°F | Ht 61.25 in | Wt 114.6 lb

## 2022-12-03 DIAGNOSIS — F4323 Adjustment disorder with mixed anxiety and depressed mood: Secondary | ICD-10-CM

## 2022-12-03 NOTE — Progress Notes (Signed)
Bethanie Dicker, NP-C Phone: [phone]  Kristy Hansen is a 37 y.o. female who presents today for follow up.   Patient was started on Zoloft 25 mg daily and Hydroxyzine 25 mg Q8 PRN last month. She has not taken the Hydroxyzine. She does take the Zoloft 25 mg daily, she has been taking half of a tablet in the mornings and half in the evenings due to it making her tired. She does feel that the medication has greatly helped with her anxiety. She is not worrying as much about things. She reports decreased energy since starting the medication. She feels that she is still adjusting to the medication. She would like to continue on the current dose.  Social History   Tobacco Use  Smoking Status Former   Packs/day: 0.50   Years: 15.00   Additional pack years: 0.00   Total pack years: 7.50   Types: Cigarettes   Quit date: 2021   Years since quitting: 3.5  Smokeless Tobacco Never    Current Outpatient Medications on File Prior to Visit  Medication Sig Dispense Refill   cetirizine (ZYRTEC) 10 MG tablet Take 10 mg by mouth daily.     EPINEPHrine (EPIPEN 2-PAK) 0.3 mg/0.3 mL IJ SOAJ injection INJECT 0.3MG  INTRAMUSCULARLY AS DIRECTED AS NEEDED FOR ALLERGIC REACTION. 2 each 3   lidocaine (XYLOCAINE) 2 % jelly Apply 1 Application topically as needed. 30 mL 5   metaxalone (SKELAXIN) 800 MG tablet Take 1 tablet (800 mg total) by mouth 3 (three) times daily. 90 tablet 3   sertraline (ZOLOFT) 25 MG tablet Take 1 tablet (25 mg total) by mouth daily. 90 tablet 0   No current facility-administered medications on file prior to visit.    ROS see history of present illness  Objective  Physical Exam Vitals:   12/03/22 0754  BP: 110/64  Pulse: 78  Temp: 98.2 F (36.8 C)  SpO2: 99%    BP Readings from Last 3 Encounters:  12/03/22 110/64  11/05/22 118/68  09/09/22 120/70   Wt Readings from Last 3 Encounters:  12/03/22 114 lb 9.6 oz (52 kg)  11/05/22 115 lb (52.2 kg)  09/09/22 115 lb 3.2  oz (52.3 kg)    Physical Exam Constitutional:      General: She is not in acute distress.    Appearance: Normal appearance.  HENT:     Head: Normocephalic.  Cardiovascular:     Rate and Rhythm: Normal rate and regular rhythm.     Heart sounds: Normal heart sounds.  Pulmonary:     Effort: Pulmonary effort is normal.     Breath sounds: Normal breath sounds.  Skin:    General: Skin is warm and dry.  Neurological:     General: No focal deficit present.     Mental Status: She is alert.  Psychiatric:        Mood and Affect: Mood normal.        Behavior: Behavior normal.    Assessment/Plan: Please see individual problem list.  Adjustment disorder with mixed anxiety and depressed mood Assessment & Plan: Improvement in anxiety symptoms since starting on Zoloft. She continues to have depression symptoms, decreased energy. She will continue the medication daily. Denies SI/HI today. Encouraged to contact if worsening symptoms, unusual behavior changes or suicidal thoughts occur. She will follow up in one month. Will continue to monitor.     Return in about 4 weeks (around 12/31/2022) for Follow up.   Bethanie Dicker, NP-C [facility] -  [employer]

## 2022-12-09 NOTE — Assessment & Plan Note (Signed)
Improvement in anxiety symptoms since starting on Zoloft. She continues to have depression symptoms, decreased energy. She will continue the medication daily. Denies SI/HI today. Encouraged to contact if worsening symptoms, unusual behavior changes or suicidal thoughts occur. She will follow up in one month. Will continue to monitor.

## 2022-12-25 NOTE — Progress Notes (Signed)
Bethanie Dicker, NP-C Phone: [phone]  Kristy Hansen is a 37 y.o. female who presents today for follow up.   Anxiety/Depression- Patient started on Zoloft 25 mg daily in May. She has a follow up last month in which she reported decreased anxiety but continued to have little motivation or interest in doing things. She remained on the Zoloft 25 mg daily. She has been taking a full tablet at bedtime. Today, she continues to have decreased anxiety. She is not worrying as much. She does not let things bother her that used to. However, she does feel like she doesn't care about things anymore. She has not been doing things around her home like she used to because she does not care about them. She reports that her boyfriend is concerned because of her disinterest in things. She does not feel bothered by this. PHQ and GAD- 0 today. Denies SI/HI.   Social History   Tobacco Use  Smoking Status Former   Current packs/day: 0.00   Average packs/day: 0.5 packs/day for 15.0 years (7.5 ttl pk-yrs)   Types: Cigarettes   Start date: 2006   Quit date: 2021   Years since quitting: 3.5  Smokeless Tobacco Never    Current Outpatient Medications on File Prior to Visit  Medication Sig Dispense Refill   cetirizine (ZYRTEC) 10 MG tablet Take 10 mg by mouth daily.     EPINEPHrine (EPIPEN 2-PAK) 0.3 mg/0.3 mL IJ SOAJ injection INJECT 0.3MG  INTRAMUSCULARLY AS DIRECTED AS NEEDED FOR ALLERGIC REACTION. 2 each 3   lidocaine (XYLOCAINE) 2 % jelly Apply 1 Application topically as needed. 30 mL 5   No current facility-administered medications on file prior to visit.    ROS see history of present illness  Objective  Physical Exam Vitals:   12/26/22 0756  BP: 100/76  Pulse: 86  Temp: 98.2 F (36.8 C)  SpO2: 98%    BP Readings from Last 3 Encounters:  12/26/22 100/76  12/03/22 110/64  11/05/22 118/68   Wt Readings from Last 3 Encounters:  12/26/22 116 lb 12.8 oz (53 kg)  12/03/22 114 lb 9.6 oz (52  kg)  11/05/22 115 lb (52.2 kg)    Physical Exam Constitutional:      General: She is not in acute distress.    Appearance: Normal appearance.  HENT:     Head: Normocephalic.  Cardiovascular:     Rate and Rhythm: Normal rate and regular rhythm.     Heart sounds: Normal heart sounds.  Pulmonary:     Effort: Pulmonary effort is normal.     Breath sounds: Normal breath sounds.  Skin:    General: Skin is warm and dry.  Neurological:     General: No focal deficit present.     Mental Status: She is alert.  Psychiatric:        Mood and Affect: Mood and affect normal.        Behavior: Behavior normal.    Assessment/Plan: Please see individual problem list.  Adjustment disorder with mixed anxiety and depressed mood Assessment & Plan: Patient disinterested in things. Feeling of not caring about anything. Will D/C Zoloft and start patient on Prozac 20 mg daily. PHQ and GAD both 0 today. Denies SI/HI. Counseled patient on common side effects. Encouraged to contact if worsening symptoms, unusual behavior changes or suicidal thoughts occur. She will follow up in one month, sooner PRN.   Orders: -     FLUoxetine HCl; Take 1 capsule (20 mg total) by mouth  daily.  Dispense: 90 capsule; Refill: 0   Return in about 4 weeks (around 01/23/2023) for Anxiety/Depression.   Bethanie Dicker, NP-C [facility] - [employer]

## 2022-12-26 ENCOUNTER — Ambulatory Visit (INDEPENDENT_AMBULATORY_CARE_PROVIDER_SITE_OTHER): Payer: Medicaid Other | Admitting: Nurse Practitioner

## 2022-12-26 VITALS — BP 100/76 | HR 86 | Temp 98.2°F | Ht 61.25 in | Wt 116.8 lb

## 2022-12-26 DIAGNOSIS — F4323 Adjustment disorder with mixed anxiety and depressed mood: Secondary | ICD-10-CM

## 2022-12-26 MED ORDER — FLUOXETINE HCL 20 MG PO CAPS
20.0000 mg | ORAL_CAPSULE | Freq: Every day | ORAL | 0 refills | Status: DC
Start: 2022-12-26 — End: 2023-01-27

## 2022-12-26 NOTE — Assessment & Plan Note (Signed)
Patient disinterested in things. Feeling of not caring about anything. Will D/C Zoloft and start patient on Prozac 20 mg daily. PHQ and GAD both 0 today. Denies SI/HI. Counseled patient on common side effects. Encouraged to contact if worsening symptoms, unusual behavior changes or suicidal thoughts occur. She will follow up in one month, sooner PRN.

## 2022-12-30 ENCOUNTER — Encounter: Payer: Self-pay | Admitting: Nurse Practitioner

## 2022-12-31 NOTE — Telephone Encounter (Signed)
Pt was seen on 7/19 by Bethanie Dicker & feels that the Prozac is not helping.

## 2023-01-07 ENCOUNTER — Encounter (INDEPENDENT_AMBULATORY_CARE_PROVIDER_SITE_OTHER): Payer: Self-pay

## 2023-01-27 ENCOUNTER — Ambulatory Visit (INDEPENDENT_AMBULATORY_CARE_PROVIDER_SITE_OTHER): Payer: Medicaid Other | Admitting: Nurse Practitioner

## 2023-01-27 ENCOUNTER — Encounter: Payer: Self-pay | Admitting: Nurse Practitioner

## 2023-01-27 VITALS — BP 110/64 | HR 67 | Temp 98.0°F | Ht 61.25 in | Wt 115.2 lb

## 2023-01-27 DIAGNOSIS — F4323 Adjustment disorder with mixed anxiety and depressed mood: Secondary | ICD-10-CM | POA: Diagnosis not present

## 2023-01-27 MED ORDER — FLUOXETINE HCL 40 MG PO CAPS
40.0000 mg | ORAL_CAPSULE | Freq: Every day | ORAL | 0 refills | Status: DC
Start: 2023-01-27 — End: 2023-02-25

## 2023-01-27 NOTE — Assessment & Plan Note (Signed)
Slight improvement in symptoms, less disinterest, more herself. Continues to have anxiety and decreased sleep since switching from Zoloft to Prozac. Will increase Prozac to 40 mg daily. Counseled patient on common side effects. Encouraged to contact if worsening symptoms, unusual behavior changes or suicidal thoughts occur. She will return in 4 weeks for follow up, sooner PRN.

## 2023-01-27 NOTE — Progress Notes (Signed)
  Bethanie Dicker, NP-C Phone: [phone]  Kristy Hansen is a 37 y.o. female who presents today for follow up.   Anxiety/Depression- Patient started on Prozac 20 mg daily last month. She does note that she feels slightly more anxious since switching from Zoloft to the Prozac. She also is not sleeping as well. She does feel more like herself than she did on the Zoloft. She is open to increasing the Prozac to see if it helps her symptoms more. She denies SI/HI. She feels that others have noticed she is more herself and not as disinterested since switching medications.   Social History   Tobacco Use  Smoking Status Former   Current packs/day: 0.00   Average packs/day: 0.5 packs/day for 15.0 years (7.5 ttl pk-yrs)   Types: Cigarettes   Start date: 2006   Quit date: 2021   Years since quitting: 3.6  Smokeless Tobacco Never    Current Outpatient Medications on File Prior to Visit  Medication Sig Dispense Refill   EPINEPHrine (EPIPEN 2-PAK) 0.3 mg/0.3 mL IJ SOAJ injection INJECT 0.3MG  INTRAMUSCULARLY AS DIRECTED AS NEEDED FOR ALLERGIC REACTION. 2 each 3   metaxalone (SKELAXIN) 800 MG tablet Take 1 tablet by mouth 3 (three) times daily.     No current facility-administered medications on file prior to visit.    ROS see history of present illness  Objective  Physical Exam Vitals:   01/27/23 0758  BP: 110/64  Pulse: 67  Temp: 98 F (36.7 C)  SpO2: 98%    BP Readings from Last 3 Encounters:  01/27/23 110/64  12/26/22 100/76  12/03/22 110/64   Wt Readings from Last 3 Encounters:  01/27/23 115 lb 3.2 oz (52.3 kg)  12/26/22 116 lb 12.8 oz (53 kg)  12/03/22 114 lb 9.6 oz (52 kg)    Physical Exam Constitutional:      General: She is not in acute distress.    Appearance: Normal appearance.  HENT:     Head: Normocephalic.  Cardiovascular:     Rate and Rhythm: Normal rate and regular rhythm.     Heart sounds: Normal heart sounds.  Pulmonary:     Effort: Pulmonary effort  is normal.     Breath sounds: Normal breath sounds.  Skin:    General: Skin is warm and dry.  Neurological:     General: No focal deficit present.     Mental Status: She is alert.  Psychiatric:        Mood and Affect: Mood normal.        Behavior: Behavior normal.    Assessment/Plan: Please see individual problem list.  Adjustment disorder with mixed anxiety and depressed mood Assessment & Plan: Slight improvement in symptoms, less disinterest, more herself. Continues to have anxiety and decreased sleep since switching from Zoloft to Prozac. Will increase Prozac to 40 mg daily. Counseled patient on common side effects. Encouraged to contact if worsening symptoms, unusual behavior changes or suicidal thoughts occur. She will return in 4 weeks for follow up, sooner PRN.   Orders: -     FLUoxetine HCl; Take 1 capsule (40 mg total) by mouth daily.  Dispense: 30 capsule; Refill: 0   Return in about 4 weeks (around 02/24/2023) for Anxiety/Depression.   Bethanie Dicker, NP-C [facility] - [employer]

## 2023-02-25 ENCOUNTER — Encounter: Payer: Self-pay | Admitting: Nurse Practitioner

## 2023-02-25 ENCOUNTER — Ambulatory Visit: Payer: Medicaid Other | Admitting: Nurse Practitioner

## 2023-02-25 VITALS — BP 114/77 | HR 64 | Temp 98.3°F | Ht 61.25 in | Wt 111.4 lb

## 2023-02-25 DIAGNOSIS — F4323 Adjustment disorder with mixed anxiety and depressed mood: Secondary | ICD-10-CM | POA: Diagnosis not present

## 2023-02-25 MED ORDER — FLUOXETINE HCL 40 MG PO CAPS
40.0000 mg | ORAL_CAPSULE | Freq: Every day | ORAL | 1 refills | Status: DC
Start: 2023-02-25 — End: 2023-04-02

## 2023-02-25 NOTE — Assessment & Plan Note (Signed)
Symptoms improving. She will continue Prozac 40 mg daily. Refills sent. Encouraged to contact if worsening symptoms, unusual behavior changes or suicidal thoughts occur. She will follow up in 3 months, sooner PRN. Will continue to monitor.

## 2023-02-25 NOTE — Progress Notes (Signed)
  Bethanie Dicker, NP-C Phone: [phone]  Kristy Hansen is a 37 y.o. female who presents today for follow up.   Anxiety/Depression- Patient currently on Prozac 40 mg daily. She increased last month from 20 mg to 40 mg. She notes feeling overall better. She feels that her anxiety is not as bad as it has been in the past but that it could also be better. She feels willing to do more things. She has had an increase in life stressors recently. She got a new puppy. She also got her children back after being with their father for the summer and they have started back to school. She would like to continue her medication at the current dose. She feels that she is on the right track and will continue to improve as her routine and life stressors settle. PHQ -5 and GAD- 4 today.   Social History   Tobacco Use  Smoking Status Former   Current packs/day: 0.00   Average packs/day: 0.5 packs/day for 15.0 years (7.5 ttl pk-yrs)   Types: Cigarettes   Start date: 2006   Quit date: 2021   Years since quitting: 3.7  Smokeless Tobacco Never    Current Outpatient Medications on File Prior to Visit  Medication Sig Dispense Refill   EPINEPHrine (EPIPEN 2-PAK) 0.3 mg/0.3 mL IJ SOAJ injection INJECT 0.3MG  INTRAMUSCULARLY AS DIRECTED AS NEEDED FOR ALLERGIC REACTION. 2 each 3   metaxalone (SKELAXIN) 800 MG tablet Take 1 tablet by mouth 3 (three) times daily.     No current facility-administered medications on file prior to visit.    ROS see history of present illness  Objective  Physical Exam Vitals:   02/25/23 1304  BP: 114/77  Pulse: 64  Temp: 98.3 F (36.8 C)  SpO2: 98%    BP Readings from Last 3 Encounters:  02/25/23 114/77  01/27/23 110/64  12/26/22 100/76   Wt Readings from Last 3 Encounters:  02/25/23 111 lb 6.4 oz (50.5 kg)  01/27/23 115 lb 3.2 oz (52.3 kg)  12/26/22 116 lb 12.8 oz (53 kg)    Physical Exam Constitutional:      General: She is not in acute distress.     Appearance: Normal appearance.  HENT:     Head: Normocephalic.  Cardiovascular:     Rate and Rhythm: Normal rate and regular rhythm.     Heart sounds: Normal heart sounds.  Pulmonary:     Effort: Pulmonary effort is normal.     Breath sounds: Normal breath sounds.  Skin:    General: Skin is warm and dry.  Neurological:     General: No focal deficit present.     Mental Status: She is alert.  Psychiatric:        Mood and Affect: Mood normal.        Behavior: Behavior normal.    Assessment/Plan: Please see individual problem list.  Adjustment disorder with mixed anxiety and depressed mood Assessment & Plan: Symptoms improving. She will continue Prozac 40 mg daily. Refills sent. Encouraged to contact if worsening symptoms, unusual behavior changes or suicidal thoughts occur. She will follow up in 3 months, sooner PRN. Will continue to monitor.   Orders: -     FLUoxetine HCl; Take 1 capsule (40 mg total) by mouth daily.  Dispense: 90 capsule; Refill: 1   Return in about 3 months (around 05/27/2023) for Anxiety/Depression.   Bethanie Dicker, NP-C [facility] - [employer]

## 2023-03-24 ENCOUNTER — Other Ambulatory Visit: Payer: Self-pay | Admitting: Nurse Practitioner

## 2023-03-24 ENCOUNTER — Encounter: Payer: Self-pay | Admitting: Nurse Practitioner

## 2023-03-24 DIAGNOSIS — G935 Compression of brain: Secondary | ICD-10-CM

## 2023-04-01 ENCOUNTER — Other Ambulatory Visit: Payer: Self-pay | Admitting: Nurse Practitioner

## 2023-04-01 ENCOUNTER — Encounter: Payer: Self-pay | Admitting: Nurse Practitioner

## 2023-04-01 DIAGNOSIS — G935 Compression of brain: Secondary | ICD-10-CM

## 2023-04-01 MED ORDER — METAXALONE 800 MG PO TABS
800.0000 mg | ORAL_TABLET | Freq: Three times a day (TID) | ORAL | 3 refills | Status: DC | PRN
Start: 2023-04-01 — End: 2023-04-15

## 2023-04-02 ENCOUNTER — Other Ambulatory Visit: Payer: Self-pay | Admitting: Nurse Practitioner

## 2023-04-02 DIAGNOSIS — F4323 Adjustment disorder with mixed anxiety and depressed mood: Secondary | ICD-10-CM

## 2023-04-02 MED ORDER — VENLAFAXINE HCL ER 37.5 MG PO CP24
37.5000 mg | ORAL_CAPSULE | Freq: Every day | ORAL | 0 refills | Status: DC
Start: 2023-04-02 — End: 2023-04-30

## 2023-04-03 ENCOUNTER — Encounter: Payer: Self-pay | Admitting: Nurse Practitioner

## 2023-04-03 ENCOUNTER — Ambulatory Visit: Payer: Medicaid Other | Admitting: Nurse Practitioner

## 2023-04-03 VITALS — BP 110/82 | HR 67 | Temp 98.2°F | Ht 61.25 in | Wt 109.8 lb

## 2023-04-03 DIAGNOSIS — F4323 Adjustment disorder with mixed anxiety and depressed mood: Secondary | ICD-10-CM | POA: Diagnosis not present

## 2023-04-03 DIAGNOSIS — F5104 Psychophysiologic insomnia: Secondary | ICD-10-CM

## 2023-04-03 MED ORDER — LORAZEPAM 0.5 MG PO TABS
0.5000 mg | ORAL_TABLET | Freq: Three times a day (TID) | ORAL | 0 refills | Status: DC | PRN
Start: 2023-04-03 — End: 2023-04-30

## 2023-04-03 NOTE — Progress Notes (Signed)
Bethanie Dicker, NP-C Phone: [phone]  Rekha Hobbins Plitt is a 37 y.o. female who presents today for anxiety and depression.   Patient with history of mixed anxiety and depression. She was not able to tolerate Zoloft. She was recently on Prozac and stopped earlier this month due to headaches and being unable to sleep. She presents today interested in trying a new medication. She is requesting something for just anxiety as she feels that her depression is related to her increased anxiety. She is not sleeping due to anxiety. She is scared to take anything to help her sleep as she is afraid she will die in her sleep. She is waking up in a panic when she does fall asleep. She is tired and overwhelmed. Her headaches have improved since stopping the Prozac on Saturday. PHQ- 14 and GAD- 16 today. Denies SI/HI. She denies any new changes in her life.   Social History   Tobacco Use  Smoking Status Former   Current packs/day: 0.00   Average packs/day: 0.5 packs/day for 15.0 years (7.5 ttl pk-yrs)   Types: Cigarettes   Start date: 2006   Quit date: 2021   Years since quitting: 3.8  Smokeless Tobacco Never    Current Outpatient Medications on File Prior to Visit  Medication Sig Dispense Refill   metaxalone (SKELAXIN) 800 MG tablet Take 1 tablet (800 mg total) by mouth 3 (three) times daily as needed for muscle spasms. 90 tablet 3   venlafaxine XR (EFFEXOR XR) 37.5 MG 24 hr capsule Take 1 capsule (37.5 mg total) by mouth daily with breakfast. 90 capsule 0   EPINEPHrine (EPIPEN 2-PAK) 0.3 mg/0.3 mL IJ SOAJ injection INJECT 0.3MG  INTRAMUSCULARLY AS DIRECTED AS NEEDED FOR ALLERGIC REACTION. 2 each 3   No current facility-administered medications on file prior to visit.     ROS see history of present illness  Objective  Physical Exam Vitals:   04/03/23 1057  BP: 110/82  Pulse: 67  Temp: 98.2 F (36.8 C)  SpO2: 91%    BP Readings from Last 3 Encounters:  04/03/23 110/82  02/25/23 114/77   01/27/23 110/64   Wt Readings from Last 3 Encounters:  04/03/23 109 lb 12.8 oz (49.8 kg)  02/25/23 111 lb 6.4 oz (50.5 kg)  01/27/23 115 lb 3.2 oz (52.3 kg)    Physical Exam Constitutional:      General: She is not in acute distress.    Appearance: Normal appearance.  HENT:     Head: Normocephalic.  Cardiovascular:     Rate and Rhythm: Normal rate and regular rhythm.     Heart sounds: Normal heart sounds.  Pulmonary:     Effort: Pulmonary effort is normal.     Breath sounds: Normal breath sounds.  Skin:    General: Skin is warm and dry.  Neurological:     General: No focal deficit present.     Mental Status: She is alert.  Psychiatric:        Mood and Affect: Mood is anxious. Affect is tearful.        Behavior: Behavior normal.    Assessment/Plan: Please see individual problem list.  Adjustment disorder with mixed anxiety and depressed mood Assessment & Plan: Worsening symptoms since stopping Prozac. Will trial Effexor XR 37.5 mg daily and Ativan 0.5 mg Q8 PRN. PHQ- 14 and GAD -16 today. Counseled patient on common side effects. Encouraged to contact if worsening symptoms, unusual behavior changes or suicidal thoughts occur. She will return in 4 weeks,  sooner PRN.   Orders: -     LORazepam; Take 1 tablet (0.5 mg total) by mouth every 8 (eight) hours as needed for anxiety or sleep.  Dispense: 45 tablet; Refill: 0  Psychophysiologic insomnia -     LORazepam; Take 1 tablet (0.5 mg total) by mouth every 8 (eight) hours as needed for anxiety or sleep.  Dispense: 45 tablet; Refill: 0   Return in about 4 weeks (around 05/01/2023) for Anxiety/Depression.   Bethanie Dicker, NP-C [facility] - [employer]

## 2023-04-13 ENCOUNTER — Other Ambulatory Visit (HOSPITAL_COMMUNITY): Payer: Self-pay

## 2023-04-13 ENCOUNTER — Telehealth: Payer: Self-pay

## 2023-04-13 DIAGNOSIS — G935 Compression of brain: Secondary | ICD-10-CM

## 2023-04-13 NOTE — Assessment & Plan Note (Signed)
Worsening symptoms since stopping Prozac. Will trial Effexor XR 37.5 mg daily and Ativan 0.5 mg Q8 PRN. PHQ- 14 and GAD -16 today. Counseled patient on common side effects. Encouraged to contact if worsening symptoms, unusual behavior changes or suicidal thoughts occur. She will return in 4 weeks, sooner PRN.

## 2023-04-13 NOTE — Telephone Encounter (Signed)
Pharmacy Patient Advocate Encounter   Received notification from CoverMyMeds that prior authorization for Metaxalone 800MG  tablets is required/requested.   Insurance verification completed.   The patient is insured through [facility] .   Per test claim: PA required; PA started via CoverMyMeds. KEY [id number] . Waiting for clinical questions to populate.

## 2023-04-13 NOTE — Telephone Encounter (Signed)
Clinical questions answered. PA submitted

## 2023-04-14 ENCOUNTER — Ambulatory Visit: Payer: Medicaid Other | Admitting: Obstetrics and Gynecology

## 2023-04-14 ENCOUNTER — Other Ambulatory Visit (HOSPITAL_COMMUNITY): Payer: Self-pay

## 2023-04-14 DIAGNOSIS — Z124 Encounter for screening for malignant neoplasm of cervix: Secondary | ICD-10-CM

## 2023-04-14 DIAGNOSIS — Z01419 Encounter for gynecological examination (general) (routine) without abnormal findings: Secondary | ICD-10-CM

## 2023-04-14 NOTE — Telephone Encounter (Signed)
Pharmacy Patient Advocate Encounter  Received notification from [facility] that Prior Authorization for [facility] 800MG  tablets has been APPROVED from 04/13/2023 to 04/12/2024. Ran test claim, Copay is $4.00. This test claim was processed through [facility]- copay amounts may vary at other pharmacies due to pharmacy/plan contracts, or as the patient moves through the different stages of their insurance plan.   PA #/Case ID/Reference #: [id number]

## 2023-04-14 NOTE — Telephone Encounter (Signed)
Pharmacy Patient Advocate Encounter  Received notification from [facility] that Prior Authorization for Metaxalone 800MG  tablets has been APPROVED from 04/13/23 to 04/12/24. Ran test claim, Copay is $4. This test claim was processed through [facility]- copay amounts may vary at other pharmacies due to pharmacy/plan contracts, or as the patient moves through the different stages of their insurance plan.   PA #/Case ID/Reference #:  [id number]

## 2023-04-15 MED ORDER — METAXALONE 800 MG PO TABS: 800.0000 mg | ORAL_TABLET | Freq: Three times a day (TID) | ORAL | 3 refills | Status: DC | PRN

## 2023-04-15 NOTE — Addendum Note (Signed)
Addended by: Donavan Foil on: 04/15/2023 08:19 AM   Modules accepted: Orders

## 2023-04-16 ENCOUNTER — Ambulatory Visit (INDEPENDENT_AMBULATORY_CARE_PROVIDER_SITE_OTHER): Payer: Medicaid Other | Admitting: Obstetrics and Gynecology

## 2023-04-16 ENCOUNTER — Other Ambulatory Visit (HOSPITAL_COMMUNITY)
Admission: RE | Admit: 2023-04-16 | Discharge: 2023-04-16 | Disposition: A | Discharge status: Home / Self Care | Payer: Medicaid Other | Source: Ambulatory Visit | Attending: Obstetrics and Gynecology | Admitting: Obstetrics and Gynecology

## 2023-04-16 ENCOUNTER — Encounter: Payer: Self-pay | Admitting: Obstetrics and Gynecology

## 2023-04-16 VITALS — BP 116/79 | HR 73 | Ht 61.25 in | Wt 109.9 lb

## 2023-04-16 DIAGNOSIS — Z01419 Encounter for gynecological examination (general) (routine) without abnormal findings: Secondary | ICD-10-CM | POA: Insufficient documentation

## 2023-04-16 DIAGNOSIS — Z124 Encounter for screening for malignant neoplasm of cervix: Secondary | ICD-10-CM | POA: Insufficient documentation

## 2023-04-16 NOTE — Progress Notes (Signed)
HPI:      [name] is a 37 y.o. [id number] who LMP was Patient's last menstrual period was 03/29/2023.  Subjective:   She presents today for her annual examination.  She has no complaints.  She has normal regular cycles.  She is not sexually active and does not desire birth control.  She says that sometimes her periods get heavy but she is not willing to do anything to try to change them.    Hx: The following portions of the patient's history were reviewed and updated as appropriate:             She  has a past medical history of Anxiety, Autoimmune disease (HCC), Chiari malformation type I (HCC), Hematuria, gross, Panic attack, Pyelonephritis, and Renal cyst. She does not have any pertinent problems on file. She  has a past surgical history that includes Myringotomy with tube placement; Cyst removal pediatric; Spine surgery; and Dental surgery. Her family history includes Breast cancer in her mother; Cancer in her mother; Diabetes in her maternal grandmother, maternal uncle, and mother; Healthy in her father; Hypertension in her mother; Other in her maternal grandfather and paternal grandfather; Stroke in her paternal grandmother. She  reports that she quit smoking about 3 years ago. Her smoking use included cigarettes. She started smoking about 18 years ago. She has a 7.5 pack-year smoking history. She has never used smokeless tobacco. She reports that she does not currently use alcohol. She reports that she does not use drugs. She has a current medication list which includes the following prescription(s): epinephrine, lorazepam, metaxalone, and venlafaxine xr. She is allergic to aspirin, bee venom, naproxen, penicillins, tramadol, varenicline, and benzonatate.       Review of Systems:  Review of Systems  Constitutional: Denied constitutional symptoms, night sweats, recent illness, fatigue, fever, insomnia and weight loss.  Eyes: Denied eye symptoms, eye pain, photophobia, vision  change and visual disturbance.  Ears/Nose/Throat/Neck: Denied ear, nose, throat or neck symptoms, hearing loss, nasal discharge, sinus congestion and sore throat.  Cardiovascular: Denied cardiovascular symptoms, arrhythmia, chest pain/pressure, edema, exercise intolerance, orthopnea and palpitations.  Respiratory: Denied pulmonary symptoms, asthma, pleuritic pain, productive sputum, cough, dyspnea and wheezing.  Gastrointestinal: Denied, gastro-esophageal reflux, melena, nausea and vomiting.  Genitourinary: Denied genitourinary symptoms including symptomatic vaginal discharge, pelvic relaxation issues, and urinary complaints.  Musculoskeletal: Denied musculoskeletal symptoms, stiffness, swelling, muscle weakness and myalgia.  Dermatologic: Denied dermatology symptoms, rash and scar.  Neurologic: Denied neurology symptoms, dizziness, headache, neck pain and syncope.  Psychiatric: Denied psychiatric symptoms, anxiety and depression.  Endocrine: Denied endocrine symptoms including hot flashes and night sweats.   Meds:   Current Outpatient Medications on File Prior to Visit  Medication Sig Dispense Refill   EPINEPHrine (EPIPEN 2-PAK) 0.3 mg/0.3 mL IJ SOAJ injection INJECT 0.3MG  INTRAMUSCULARLY AS DIRECTED AS NEEDED FOR ALLERGIC REACTION. 2 each 3   LORazepam (ATIVAN) 0.5 MG tablet Take 1 tablet (0.5 mg total) by mouth every 8 (eight) hours as needed for anxiety or sleep. 45 tablet 0   metaxalone (SKELAXIN) 800 MG tablet Take 1 tablet (800 mg total) by mouth 3 (three) times daily as needed for muscle spasms. 90 tablet 3   venlafaxine XR (EFFEXOR XR) 37.5 MG 24 hr capsule Take 1 capsule (37.5 mg total) by mouth daily with breakfast. 90 capsule 0   No current facility-administered medications on file prior to visit.     Objective:     Vitals:   04/16/23 0954  BP:  116/79  Pulse: 73    Filed Weights   04/16/23 0954  Weight: 109 lb 14.4 oz (49.9 kg)              Physical  examination General NAD, Conversant  HEENT Atraumatic; Op clear with mmm.  Normo-cephalic.  Anicteric sclerae  Thyroid/Neck Smooth without nodularity or enlargement. Normal ROM.  Neck Supple.  Skin No rashes, lesions or ulceration. Normal palpated skin turgor. No nodularity.  Breasts: Deferred breast exam  Lungs: Clear to auscultation.No rales or wheezes. Normal Respiratory effort, no retractions.  Heart: NSR.  No murmurs or rubs appreciated. No peripheral edema  Abdomen: Soft.  Non-tender.  No masses.  No HSM. No hernia  Extremities: Moves all appropriately.  Normal ROM for age. No lymphadenopathy.  Neuro: Oriented to PPT.  Normal mood. Normal affect.     Pelvic:   Vulva: Normal appearance.  No lesions.  Vagina: No lesions or abnormalities noted.  Support: Normal pelvic support.  Urethra No masses tenderness or scarring.  Meatus Normal size without lesions or prolapse.  Cervix: Normal appearance.  No lesions.  Anus: Normal exam.  No lesions.  Perineum: Normal exam.  No lesions.        Bimanual   Uterus: Normal size.  Non-tender.  Mobile.  AV.  Adnexae: No masses.  Non-tender to palpation.  Cul-de-sac: Negative for abnormality.     Assessment:    [id number] Patient Active Problem List   Diagnosis Date Noted   Psychophysiologic insomnia 04/03/2023   Adjustment disorder with mixed anxiety and depressed mood 11/05/2022   Preventative health care 09/09/2022   Family history of breast cancer in mother 09/09/2022   Light headedness 08/06/2021   Pain, dental 04/04/2021   Elevated liver enzymes 03/06/2020   Blurry vision, bilateral 02/08/2020   Fatigue 02/08/2020   Chiari malformation type I (HCC) 10/20/2017   Renal cyst 01/05/2015   Paresthesias 12/12/2014   Family history of ovarian cancer 12/12/2014   Tobacco user 12/12/2014   Back pain, chronic 07/17/2014     1. Well woman exam with routine gynecological exam   2. Cervical cancer screening        Plan:             1.  Basic Screening Recommendations The basic screening recommendations for asymptomatic women were discussed with the patient during her visit.  The age-appropriate recommendations were discussed with her and the rational for the tests reviewed.  When I am informed by the patient that another primary care physician has previously obtained the age-appropriate tests and they are up-to-date, only outstanding tests are ordered and referrals given as necessary.  Abnormal results of tests will be discussed with her when all of her results are completed.  Routine preventative health maintenance measures emphasized: Exercise/Diet/Weight control, Tobacco Warnings, Alcohol/Substance use risks and Stress Management Pap performed 2.  She will contact us if she desires hormonal management of her heavy menstrual bleeding. Orders No orders of the defined types were placed in this encounter.   No orders of the defined types were placed in this encounter.         F/U  Return in about 1 year (around 04/15/2024) for Annual Physical.  Elonda Husky, M.D. 04/16/2023 10:10 AM

## 2023-04-16 NOTE — Progress Notes (Signed)
Patients presents for annual exam today. She states doing well, reports normal monthly cycles. Due for pap smear, ordered. Mammogram and annual labs are up to date. She states no other questions or concerns at this time.

## 2023-04-20 LAB — CYTOLOGY - PAP
Comment: NEGATIVE
Diagnosis: NEGATIVE
High risk HPV: NEGATIVE

## 2023-04-30 ENCOUNTER — Encounter: Payer: Self-pay | Admitting: Nurse Practitioner

## 2023-04-30 ENCOUNTER — Ambulatory Visit: Payer: Medicaid Other | Admitting: Nurse Practitioner

## 2023-04-30 VITALS — BP 108/76 | HR 77 | Temp 98.1°F | Ht 61.25 in | Wt 110.2 lb

## 2023-04-30 DIAGNOSIS — F5104 Psychophysiologic insomnia: Secondary | ICD-10-CM | POA: Diagnosis not present

## 2023-04-30 DIAGNOSIS — F4323 Adjustment disorder with mixed anxiety and depressed mood: Secondary | ICD-10-CM | POA: Diagnosis not present

## 2023-04-30 MED ORDER — LORAZEPAM 0.5 MG PO TABS: 0.5000 mg | ORAL_TABLET | Freq: Three times a day (TID) | ORAL | 2 refills | Status: DC | PRN

## 2023-04-30 MED ORDER — VENLAFAXINE HCL ER 75 MG PO CP24
75.0000 mg | ORAL_CAPSULE | Freq: Every day | ORAL | 1 refills | Status: DC
Start: 2023-04-30 — End: 2023-09-11

## 2023-04-30 NOTE — Progress Notes (Signed)
Bethanie Dicker, NP-C Phone: [phone]  Kristy Hansen is a 37 y.o. female who presents today for follow up.   Discussed the use of AI scribe software for clinical note transcription with the patient, who gave verbal consent to proceed.  History of Present Illness   The patient, with a history of anxiety and depression, reports significant improvement in mood since the last visit. They have been taking Ativan and Effexor, with the Ativan being used primarily at night to aid sleep. The patient reports that they have been sleeping much better with the Ativan, but when they skip a dose, they experience disrupted sleep. They have been hesitant to use Ativan during the day due to concerns about potential addiction, even during high-stress situations.  The patient reports that they take Effexor a few hours before bed and that it helps manage their anxiety most of the time. They have not experienced any headaches since starting the medication and feel that it is the most effective treatment they have tried, next to Zoloft.  Despite the improvements in mood and sleep, the patient reports persistent fatigue. They describe feeling physically tired and express frustration with this ongoing issue. They report that they are getting approximately seven hours of sleep per night and feel that they are sleeping well, but still wake up feeling tired. The patient also mentions that they have been trying to improve their diet and drink more water, but have not been exercising regularly.  The patient also mentions that they have been reading more, which they enjoy and find helpful for managing their anxiety. They report that they are generally active during the day and do not spend much time sitting unless they are reading. Despite these positive changes, the patient expresses a desire for further improvement in their energy levels and overall well-being.      Social History   Tobacco Use  Smoking Status Former    Current packs/day: 0.00   Average packs/day: 0.5 packs/day for 15.0 years (7.5 ttl pk-yrs)   Types: Cigarettes   Start date: 2006   Quit date: 2021   Years since quitting: 3.8  Smokeless Tobacco Never    Current Outpatient Medications on File Prior to Visit  Medication Sig Dispense Refill   EPINEPHrine (EPIPEN 2-PAK) 0.3 mg/0.3 mL IJ SOAJ injection INJECT 0.3MG  INTRAMUSCULARLY AS DIRECTED AS NEEDED FOR ALLERGIC REACTION. 2 each 3   metaxalone (SKELAXIN) 800 MG tablet Take 1 tablet (800 mg total) by mouth 3 (three) times daily as needed for muscle spasms. 90 tablet 3   No current facility-administered medications on file prior to visit.     ROS see history of present illness  Objective  Physical Exam Vitals:   04/30/23 1306  BP: 108/76  Pulse: 77  Temp: 98.1 F (36.7 C)  SpO2: 98%    BP Readings from Last 3 Encounters:  04/30/23 108/76  04/16/23 116/79  04/03/23 110/82   Wt Readings from Last 3 Encounters:  04/30/23 110 lb 3.2 oz (50 kg)  04/16/23 109 lb 14.4 oz (49.9 kg)  04/03/23 109 lb 12.8 oz (49.8 kg)    Physical Exam Constitutional:      General: She is not in acute distress.    Appearance: Normal appearance.  HENT:     Head: Normocephalic.  Cardiovascular:     Rate and Rhythm: Normal rate and regular rhythm.     Heart sounds: Normal heart sounds.  Pulmonary:     Effort: Pulmonary effort is normal.  Breath sounds: Normal breath sounds.  Skin:    General: Skin is warm and dry.  Neurological:     General: No focal deficit present.     Mental Status: She is alert.  Psychiatric:        Mood and Affect: Mood normal.        Behavior: Behavior normal.    Assessment/Plan: Please see individual problem list.  Adjustment disorder with mixed anxiety and depressed mood Assessment & Plan: Their mood and sleep have improved with the current regimen of Effexor 37.5 mg daily and Ativan 0.5 mg TID PRN. PHQ- 7 and GAD- 5 today. We discussed the  appropriate use of Ativan for acute anxiety episodes and the risk of dependency. They report persistent fatigue despite improved sleep. We will increase Effexor to 75 mg daily to potentially improve daytime symptoms. Ativan 0.5 mg will continue as needed for acute anxiety episodes and sleep. We encourage them to resume regular exercise as tolerated and to check in 3 months or sooner if needed.   Orders: -     Venlafaxine HCl ER; Take 1 capsule (75 mg total) by mouth daily with breakfast.  Dispense: 90 capsule; Refill: 1 -     LORazepam; Take 1 tablet (0.5 mg total) by mouth every 8 (eight) hours as needed for anxiety or sleep.  Dispense: 45 tablet; Refill: 2  Psychophysiologic insomnia Assessment & Plan: Improvement in sleep with Ativan 0.5 mg at bedtime. Continue. Refills sent. She continues to have symptoms of fatigue and tiredness despite sleeping better. We will monitor as mood and insomnia continue to improve.   Orders: -     LORazepam; Take 1 tablet (0.5 mg total) by mouth every 8 (eight) hours as needed for anxiety or sleep.  Dispense: 45 tablet; Refill: 2    Return in about 3 months (around 07/31/2023) for Anxiety/Depression.   Bethanie Dicker, NP-C [facility] - [employer]

## 2023-04-30 NOTE — Assessment & Plan Note (Signed)
Improvement in sleep with Ativan 0.5 mg at bedtime. Continue. Refills sent. She continues to have symptoms of fatigue and tiredness despite sleeping better. We will monitor as mood and insomnia continue to improve.

## 2023-04-30 NOTE — Assessment & Plan Note (Addendum)
Their mood and sleep have improved with the current regimen of Effexor 37.5 mg daily and Ativan 0.5 mg TID PRN. PHQ- 7 and GAD- 5 today. We discussed the appropriate use of Ativan for acute anxiety episodes and the risk of dependency. They report persistent fatigue despite improved sleep. We will increase Effexor to 75 mg daily to potentially improve daytime symptoms. Ativan 0.5 mg will continue as needed for acute anxiety episodes and sleep. We encourage them to resume regular exercise as tolerated and to check in 3 months or sooner if needed.

## 2023-05-27 ENCOUNTER — Ambulatory Visit: Payer: Medicaid Other | Admitting: Nurse Practitioner

## 2023-06-24 ENCOUNTER — Encounter: Payer: Self-pay | Admitting: Neurology

## 2023-06-24 ENCOUNTER — Other Ambulatory Visit: Payer: Self-pay | Admitting: Neurology

## 2023-06-24 DIAGNOSIS — G35 Multiple sclerosis: Secondary | ICD-10-CM

## 2023-07-02 ENCOUNTER — Ambulatory Visit
Admission: RE | Admit: 2023-07-02 | Discharge: 2023-07-02 | Disposition: A | Discharge status: Home / Self Care | Payer: Medicaid Other | Source: Ambulatory Visit | Attending: Neurology | Admitting: Neurology

## 2023-07-02 DIAGNOSIS — G35 Multiple sclerosis: Secondary | ICD-10-CM

## 2023-07-28 ENCOUNTER — Encounter: Payer: Self-pay | Admitting: Nurse Practitioner

## 2023-07-28 ENCOUNTER — Ambulatory Visit: Payer: Medicaid Other | Admitting: Nurse Practitioner

## 2023-07-28 VITALS — BP 110/76 | HR 77 | Temp 98.2°F | Ht 61.25 in | Wt 115.6 lb

## 2023-07-28 DIAGNOSIS — L309 Dermatitis, unspecified: Secondary | ICD-10-CM

## 2023-07-28 DIAGNOSIS — R195 Other fecal abnormalities: Secondary | ICD-10-CM

## 2023-07-28 DIAGNOSIS — R1011 Right upper quadrant pain: Secondary | ICD-10-CM | POA: Diagnosis not present

## 2023-07-28 DIAGNOSIS — F5104 Psychophysiologic insomnia: Secondary | ICD-10-CM | POA: Diagnosis not present

## 2023-07-28 LAB — CBC WITH DIFFERENTIAL/PLATELET
Basophils Absolute: 0 10*3/uL (ref 0.0–0.1)
Basophils Relative: 0.4 % (ref 0.0–3.0)
Eosinophils Absolute: 0.1 10*3/uL (ref 0.0–0.7)
Eosinophils Relative: 1.4 % (ref 0.0–5.0)
HCT: 36.9 % (ref 36.0–46.0)
Hemoglobin: 12.3 g/dL (ref 12.0–15.0)
Lymphocytes Relative: 16.7 % (ref 12.0–46.0)
Lymphs Abs: 1.1 10*3/uL (ref 0.7–4.0)
MCHC: 33.2 g/dL (ref 30.0–36.0)
MCV: 89.7 fL (ref 78.0–100.0)
Monocytes Absolute: 0.4 10*3/uL (ref 0.1–1.0)
Monocytes Relative: 6.6 % (ref 3.0–12.0)
Neutro Abs: 4.9 10*3/uL (ref 1.4–7.7)
Neutrophils Relative %: 74.9 % (ref 43.0–77.0)
Platelets: 240 10*3/uL (ref 150.0–400.0)
RBC: 4.11 Mil/uL (ref 3.87–5.11)
RDW: 13.2 % (ref 11.5–15.5)
WBC: 6.5 10*3/uL (ref 4.0–10.5)

## 2023-07-28 LAB — BASIC METABOLIC PANEL
BUN: 13 mg/dL (ref 6–23)
CO2: 30 meq/L (ref 19–32)
Calcium: 8.8 mg/dL (ref 8.4–10.5)
Chloride: 101 meq/L (ref 96–112)
Creatinine, Ser: 0.57 mg/dL (ref 0.40–1.20)
GFR: 117.61 mL/min (ref >60.00)
Glucose, Bld: 79 mg/dL (ref 70–99)
Potassium: 4 meq/L (ref 3.5–5.1)
Sodium: 136 meq/L (ref 135–145)

## 2023-07-28 LAB — HEPATIC FUNCTION PANEL
ALT: 10 U/L (ref 0–35)
AST: 15 U/L (ref 0–37)
Albumin: 4.2 g/dL (ref 3.5–5.2)
Alkaline Phosphatase: 41 U/L (ref 39–117)
Bilirubin, Direct: 0.1 mg/dL (ref 0.0–0.3)
Total Bilirubin: 0.7 mg/dL (ref 0.2–1.2)
Total Protein: 6.4 g/dL (ref 6.0–8.3)

## 2023-07-28 MED ORDER — TRIAMCINOLONE ACETONIDE 0.1 % EX CREA: 1.0000 | TOPICAL_CREAM | Freq: Two times a day (BID) | CUTANEOUS | 1 refills | Status: DC | PRN

## 2023-07-28 MED ORDER — METHYLPREDNISOLONE 4 MG PO TBPK: ORAL_TABLET | ORAL | 0 refills | Status: DC

## 2023-07-28 NOTE — Assessment & Plan Note (Signed)
The dermatitis is likely due to excessive hand washing and hand sanitizer use, with no eczema history. Symptoms include severe itching, dryness, and pain. Discontinue hand sanitizer and excessive washing. Apply prescribed steroid cream, counseled on common side effects and to use sparingly. Will also prescribe MDP to help with symptoms. Return precautions given to patient.

## 2023-07-28 NOTE — Assessment & Plan Note (Signed)
Bloating and right upper quadrant pain occur, especially post-meal, with loose stools and no solid bowel movements. There is no nausea or vomiting. Order labs to check liver enzymes and an ultrasound to assess the liver/gallbladder. Seek immediate medical attention if symptoms worsen, such as high fevers, inability to keep anything down, or blood in vomit or stool. Further work up pending results.

## 2023-07-28 NOTE — Progress Notes (Signed)
Bethanie Dicker, NP-C Phone: [phone]  Kristy Hansen is a 38 y.o. female who presents today for hand swelling.   Discussed the use of AI scribe software for clinical note transcription with the patient, who gave verbal consent to proceed.  History of Present Illness   Kristy Hansen is a 38 year old female who presents with severe hand itching and swelling, and abdominal bloating and pain.  She experiences severe itching and swelling of her hands, which has been persistent and worsening. The itching is described as unbearable, leading to scratching that causes bleeding. She has spent over a hundred dollars on various lotions without relief. There is a family history of eczema, but she denies having it herself. Frequent use of hand sanitizer and hand washing are believed to contribute to the condition. Her hands are dry, cracked, and painful, and she has been using Aquaphor gloves and Vaseline at night without improvement. She is currently taking Benadryl to manage the itching.  She reports significant abdominal bloating and pain, particularly after eating, ongoing for over a year. The bloating is most pronounced after consuming greasy foods, and the pain is described as a stabbing sensation in the right upper quadrant. She experiences loose stools, which are consistently liquid, and has been taking probiotics and sugar-free greens gummies to manage the symptoms. She is extremely gassy, with a foul odor if she does not take the gummies. No nausea or vomiting, but the pain and bloating have become increasingly uncomfortable, affecting her diet and leading her to avoid certain foods.      Social History   Tobacco Use  Smoking Status Former   Current packs/day: 0.00   Average packs/day: 0.5 packs/day for 15.0 years (7.5 ttl pk-yrs)   Types: Cigarettes   Start date: 2006   Quit date: 2021   Years since quitting: 4.1  Smokeless Tobacco Never    Current Outpatient Medications on File  Prior to Visit  Medication Sig Dispense Refill   EPINEPHrine (EPIPEN 2-PAK) 0.3 mg/0.3 mL IJ SOAJ injection INJECT 0.3MG  INTRAMUSCULARLY AS DIRECTED AS NEEDED FOR ALLERGIC REACTION. 2 each 3   LORazepam (ATIVAN) 0.5 MG tablet Take 1 tablet (0.5 mg total) by mouth every 8 (eight) hours as needed for anxiety or sleep. 45 tablet 2   metaxalone (SKELAXIN) 800 MG tablet Take 1 tablet (800 mg total) by mouth 3 (three) times daily as needed for muscle spasms. 90 tablet 3   venlafaxine XR (EFFEXOR XR) 75 MG 24 hr capsule Take 1 capsule (75 mg total) by mouth daily with breakfast. 90 capsule 1   No current facility-administered medications on file prior to visit.     ROS see history of present illness  Objective  Physical Exam Vitals:   07/28/23 0814  BP: 110/76  Pulse: 77  Temp: 98.2 F (36.8 C)  SpO2: 94%    BP Readings from Last 3 Encounters:  07/28/23 110/76  04/30/23 108/76  04/16/23 116/79   Wt Readings from Last 3 Encounters:  07/28/23 115 lb 9.6 oz (52.4 kg)  04/30/23 110 lb 3.2 oz (50 kg)  04/16/23 109 lb 14.4 oz (49.9 kg)    Physical Exam Constitutional:      General: She is not in acute distress.    Appearance: Normal appearance.  HENT:     Head: Normocephalic.  Cardiovascular:     Rate and Rhythm: Normal rate and regular rhythm.     Heart sounds: Normal heart sounds.  Pulmonary:  Effort: Pulmonary effort is normal.     Breath sounds: Normal breath sounds.  Abdominal:     General: Abdomen is flat. Bowel sounds are normal.     Palpations: Abdomen is soft.     Tenderness: There is no abdominal tenderness. There is no guarding.  Skin:    General: Skin is warm and dry.     Comments: Bilateral hands inflamed with red, dry, cracking skin. Pain and tenderness with movements.  Neurological:     General: No focal deficit present.     Mental Status: She is alert.  Psychiatric:        Mood and Affect: Mood normal.        Behavior: Behavior normal.     Assessment/Plan: Please see individual problem list.  Right upper quadrant abdominal pain Assessment & Plan: Bloating and right upper quadrant pain occur, especially post-meal, with loose stools and no solid bowel movements. There is no nausea or vomiting. Order labs to check liver enzymes and an ultrasound to assess the liver/gallbladder. Seek immediate medical attention if symptoms worsen, such as high fevers, inability to keep anything down, or blood in vomit or stool. Further work up pending results.   Orders: -     CBC with Differential/Platelet -     Basic metabolic panel -     Hepatic function panel -     US ABDOMEN LIMITED RUQ (LIVER/GB); Future  Loose stools -     US ABDOMEN LIMITED RUQ (LIVER/GB); Future  Dermatitis Assessment & Plan: The dermatitis is likely due to excessive hand washing and hand sanitizer use, with no eczema history. Symptoms include severe itching, dryness, and pain. Discontinue hand sanitizer and excessive washing. Apply prescribed steroid cream, counseled on common side effects and to use sparingly. Will also prescribe MDP to help with symptoms. Return precautions given to patient.   Orders: -     methylPREDNISolone; Take as directed.  Dispense: 21 each; Refill: 0 -     Triamcinolone Acetonide; Apply 1 Application topically 2 (two) times daily as needed.  Dispense: 45 g; Refill: 1  Psychophysiologic insomnia Assessment & Plan: There is difficulty sleeping, with approximately 5 hours of sleep per night, while on Effexor and Ativan. She was started on Nortriptyline 10mg  by Neurology, advised to increase to 20mg  as per neurologist's instructions. Follow up with the neurologist in less than a month to assess the effectiveness of the increased dose.     Return in 6 weeks (on 09/11/2023), or if symptoms worsen or fail to improve, for as scheduled.   Bethanie Dicker, NP-C [facility] - [facility]

## 2023-07-28 NOTE — Assessment & Plan Note (Signed)
There is difficulty sleeping, with approximately 5 hours of sleep per night, while on Effexor and Ativan. She was started on Nortriptyline 10mg  by Neurology, advised to increase to 20mg  as per neurologist's instructions. Follow up with the neurologist in less than a month to assess the effectiveness of the increased dose.

## 2023-08-03 ENCOUNTER — Ambulatory Visit
Admission: RE | Admit: 2023-08-03 | Discharge: 2023-08-03 | Disposition: A | Discharge status: Home / Self Care | Payer: Medicaid Other | Source: Ambulatory Visit | Attending: Nurse Practitioner | Admitting: Nurse Practitioner

## 2023-08-03 DIAGNOSIS — R195 Other fecal abnormalities: Secondary | ICD-10-CM | POA: Insufficient documentation

## 2023-08-03 DIAGNOSIS — R1011 Right upper quadrant pain: Secondary | ICD-10-CM | POA: Insufficient documentation

## 2023-08-05 ENCOUNTER — Other Ambulatory Visit: Payer: Self-pay | Admitting: Nurse Practitioner

## 2023-08-05 DIAGNOSIS — R1011 Right upper quadrant pain: Secondary | ICD-10-CM

## 2023-08-12 ENCOUNTER — Encounter: Payer: Self-pay | Admitting: Nurse Practitioner

## 2023-09-11 ENCOUNTER — Ambulatory Visit: Payer: Medicaid Other | Admitting: Nurse Practitioner

## 2023-09-11 VITALS — BP 110/62 | HR 93 | Temp 97.5°F | Ht 61.25 in | Wt 118.8 lb

## 2023-09-11 DIAGNOSIS — G935 Compression of brain: Secondary | ICD-10-CM | POA: Diagnosis not present

## 2023-09-11 DIAGNOSIS — R519 Headache, unspecified: Secondary | ICD-10-CM | POA: Diagnosis not present

## 2023-09-11 DIAGNOSIS — G4719 Other hypersomnia: Secondary | ICD-10-CM | POA: Diagnosis not present

## 2023-09-11 DIAGNOSIS — F4323 Adjustment disorder with mixed anxiety and depressed mood: Secondary | ICD-10-CM

## 2023-09-11 LAB — IBC + FERRITIN
Ferritin: 12.6 ng/mL (ref 10.0–291.0)
Iron: 115 ug/dL (ref 42–145)
Saturation Ratios: 31.7 % (ref 20.0–50.0)
TIBC: 362.6 ug/dL (ref 250.0–450.0)
Transferrin: 259 mg/dL (ref 212.0–360.0)

## 2023-09-11 LAB — CBC WITH DIFFERENTIAL/PLATELET
Basophils Absolute: 0 10*3/uL (ref 0.0–0.1)
Basophils Relative: 0.6 % (ref 0.0–3.0)
Eosinophils Absolute: 0.1 10*3/uL (ref 0.0–0.7)
Eosinophils Relative: 1.9 % (ref 0.0–5.0)
HCT: 38.1 % (ref 36.0–46.0)
Hemoglobin: 12.6 g/dL (ref 12.0–15.0)
Lymphocytes Relative: 20.9 % (ref 12.0–46.0)
Lymphs Abs: 1 10*3/uL (ref 0.7–4.0)
MCHC: 33 g/dL (ref 30.0–36.0)
MCV: 89.4 fl (ref 78.0–100.0)
Monocytes Absolute: 0.3 10*3/uL (ref 0.1–1.0)
Monocytes Relative: 6.3 % (ref 3.0–12.0)
Neutro Abs: 3.3 10*3/uL (ref 1.4–7.7)
Neutrophils Relative %: 70.3 % (ref 43.0–77.0)
Platelets: 246 10*3/uL (ref 150.0–400.0)
RBC: 4.26 Mil/uL (ref 3.87–5.11)
RDW: 13.2 % (ref 11.5–15.5)
WBC: 4.7 10*3/uL (ref 4.0–10.5)

## 2023-09-11 LAB — COMPREHENSIVE METABOLIC PANEL WITH GFR
ALT: 15 U/L (ref 0–35)
AST: 17 U/L (ref 0–37)
Albumin: 4.4 g/dL (ref 3.5–5.2)
Alkaline Phosphatase: 46 U/L (ref 39–117)
BUN: 12 mg/dL (ref 6–23)
CO2: 30 meq/L (ref 19–32)
Calcium: 9.1 mg/dL (ref 8.4–10.5)
Chloride: 103 meq/L (ref 96–112)
Creatinine, Ser: 0.59 mg/dL (ref 0.40–1.20)
GFR: 116.54 mL/min (ref >60.00)
Glucose, Bld: 72 mg/dL (ref 70–99)
Potassium: 4.6 meq/L (ref 3.5–5.1)
Sodium: 139 meq/L (ref 135–145)
Total Bilirubin: 0.5 mg/dL (ref 0.2–1.2)
Total Protein: 6.5 g/dL (ref 6.0–8.3)

## 2023-09-11 LAB — VITAMIN B12: Vitamin B-12: 224 pg/mL (ref 211–911)

## 2023-09-11 LAB — TSH: TSH: 0.93 u[IU]/mL (ref 0.35–5.50)

## 2023-09-11 LAB — SEDIMENTATION RATE: Sed Rate: 1 mm/h (ref 0–20)

## 2023-09-11 LAB — VITAMIN D 25 HYDROXY (VIT D DEFICIENCY, FRACTURES): VITD: 35.89 ng/mL (ref 30.00–100.00)

## 2023-09-11 MED ORDER — VENLAFAXINE HCL ER 37.5 MG PO CP24: 37.5000 mg | ORAL_CAPSULE | Freq: Every day | ORAL | 0 refills | Status: DC

## 2023-09-11 NOTE — Progress Notes (Signed)
 Bethanie Dicker, NP-C Phone: [phone]  Kristy Hansen is a 38 y.o. female who presents today for .   Discussed the use of AI scribe software for clinical note transcription with the patient, who gave verbal consent to proceed.  History of Present Illness   Kristy Hansen is a 38 year old female who presents with excessive sleepiness and headaches.  She experiences excessive sleepiness, which she attributes to her current medication regimen, particularly Effexor. Despite sleeping from 11 PM to 6:15 AM and taking naps during the day, she feels excessively sleepy, even while driving. She describes the sleepiness as severe, stating she has fallen asleep while driving and struggles to stay awake during the day. She is currently taking Effexor and Ativan, with Ativan helping her sleep at night. Nortriptyline was recently increased to four times a day, but it does not help with her headaches. She switched the timing of her nortriptyline dose from night to morning about a week ago due to severe headaches during the day, but continues to experience significant sleepiness and headaches.  She has daily headaches that start in the occipital region and sometimes wrap around to the frontal area, becoming severe enough to cause photophobia. She takes Motrin daily for relief, but it does not alleviate her symptoms. She was previously on Prozac, which did not help her sleep, leading to the switch to Effexor.  She has a history of Chiari malformation, which causes neck and back pain. 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 She has not had symptoms from her Chiari until after childbirth.      Social History   Tobacco Use  Smoking Status Former   Current packs/day: 0.00   Average packs/day: 0.5 packs/day for 15.0 years (7.5 ttl pk-yrs)   Types: Cigarettes   Start date: 2006   Quit date: 2021    Years since quitting: 4.2  Smokeless Tobacco Never    Current Outpatient Medications on File Prior to Visit  Medication Sig Dispense Refill   EPINEPHrine (EPIPEN 2-PAK) 0.3 mg/0.3 mL IJ SOAJ injection INJECT 0.3MG  INTRAMUSCULARLY AS DIRECTED AS NEEDED FOR ALLERGIC REACTION. 2 each 3   LORazepam (ATIVAN) 0.5 MG tablet Take 1 tablet (0.5 mg total) by mouth every 8 (eight) hours as needed for anxiety or sleep. 45 tablet 2   metaxalone (SKELAXIN) 800 MG tablet Take 1 tablet (800 mg total) by mouth 3 (three) times daily as needed for muscle spasms. 90 tablet 3   No current facility-administered medications on file prior to visit.    ROS see history of present illness  Objective  Physical Exam Vitals:   09/11/23 0829  BP: 110/62  Pulse: 93  Temp: (!) 97.5 F (36.4 C)  SpO2: 100%    BP Readings from Last 3 Encounters:  09/11/23 110/62  07/28/23 110/76  04/30/23 108/76   Wt Readings from Last 3 Encounters:  09/18/23 118 lb (53.5 kg)  09/11/23 118 lb 12.8 oz (53.9 kg)  07/28/23 115 lb 9.6 oz (52.4 kg)    Physical Exam Constitutional:      General: She is not in acute distress.    Appearance: Normal appearance.  HENT:     Head: Normocephalic.  Cardiovascular:     Rate and Rhythm: Normal rate and regular rhythm.     Heart sounds: Normal heart sounds.  Pulmonary:     Effort: Pulmonary  effort is normal.     Breath sounds: Normal breath sounds.  Skin:    General: Skin is warm and dry.  Neurological:     General: No focal deficit present.     Mental Status: She is alert.  Psychiatric:        Mood and Affect: Mood normal.        Behavior: Behavior normal.      Assessment/Plan: Please see individual problem list.  Excessive daytime sleepiness Assessment & Plan: Daytime sleepiness is likely due to venlafaxine, affecting daily activities. Discussed options at length with patient and she wishes to discontinue venlafaxine. Advised to decrease venlafaxine to 37.5 mg daily  for two weeks, then 37.5 mg every other day for two weeks before discontinuing. Monitor mood, depression, and withdrawal symptoms. Advised discussing with neurology during the upcoming appointment if nortriptyline could be contributing as well. Advised not to drive if sleepy. We will check lab work as outlined.   Orders: -     CBC with Differential/Platelet -     Comprehensive metabolic panel with GFR -     TSH -     VITAMIN D 25 Hydroxy (Vit-D Deficiency, Fractures) -     Vitamin B12 -     IBC + Ferritin -     ANA,IFA RA Diag Pnl w/rflx Tit/Patn  Headache disorder Assessment & Plan: She experiences daily headaches unresponsive to nortriptyline and ibuprofen. Continue current medications and discuss further with neurology during the upcoming appointment.  Orders: -     Venlafaxine HCl ER; Take 1 capsule (37.5 mg total) by mouth daily with breakfast.  Dispense: 30 capsule; Refill: 0  Chiari malformation type I (HCC) Assessment & Plan: Chiari malformation is causing neck and back pain. Surgical intervention was discussed but not pursued. Continue current management and discuss with neurology during the upcoming appointment.  Orders: -     Sedimentation rate  Adjustment disorder with mixed anxiety and depressed mood Assessment & Plan: Anxiety is managed with lorazepam at bedtime, but there is a desire to discontinue due to daytime sleepiness. She is planning to wean off venlafaxine. See plan for daytime sleepiness.   Orders: -     Venlafaxine HCl ER; Take 1 capsule (37.5 mg total) by mouth daily with breakfast.  Dispense: 30 capsule; Refill: 0     Return in about 4 weeks (around 10/09/2023) for Follow up.   Bethanie Dicker, NP-C [facility]

## 2023-09-14 LAB — ANA,IFA RA DIAG PNL W/RFLX TIT/PATN
Anti Nuclear Antibody (ANA): NEGATIVE
Cyclic Citrullin Peptide Ab: <16 U
Rheumatoid fact SerPl-aCnc: 14 [IU]/mL — ABNORMAL HIGH (ref <14)

## 2023-09-17 ENCOUNTER — Other Ambulatory Visit: Payer: Self-pay

## 2023-09-17 ENCOUNTER — Telehealth: Payer: Self-pay

## 2023-09-17 ENCOUNTER — Other Ambulatory Visit (HOSPITAL_COMMUNITY): Payer: Self-pay

## 2023-09-17 DIAGNOSIS — L309 Dermatitis, unspecified: Secondary | ICD-10-CM

## 2023-09-17 MED ORDER — TRIAMCINOLONE ACETONIDE 0.1 % EX CREA: 1.0000 | TOPICAL_CREAM | Freq: Two times a day (BID) | CUTANEOUS | 1 refills | Status: DC | PRN

## 2023-09-17 NOTE — Telephone Encounter (Signed)
 Pharmacy Patient Advocate Encounter   Received notification from Patient Pharmacy that prior authorization for Triamcinolone 0.1% cream is required/requested.   Insurance verification completed.   The patient is insured through [employer] .   Per test claim: Refill too soon. PA is not needed at this time. Medication was filled 09/17/23. Next eligible fill date is 10/04/23.

## 2023-09-18 ENCOUNTER — Telehealth: Admitting: Nurse Practitioner

## 2023-09-18 VITALS — Ht 61.25 in | Wt 118.0 lb

## 2023-09-18 DIAGNOSIS — F4323 Adjustment disorder with mixed anxiety and depressed mood: Secondary | ICD-10-CM

## 2023-09-18 DIAGNOSIS — R519 Headache, unspecified: Secondary | ICD-10-CM

## 2023-09-18 DIAGNOSIS — G4719 Other hypersomnia: Secondary | ICD-10-CM

## 2023-09-18 NOTE — Assessment & Plan Note (Signed)
 She experiences daily headaches unresponsive to nortriptyline and ibuprofen. Continue current medications and discuss further with neurology during the upcoming appointment.

## 2023-09-18 NOTE — Assessment & Plan Note (Signed)
 Chiari malformation is causing neck and back pain. Surgical intervention was discussed but not pursued. Continue current management and discuss with neurology during the upcoming appointment.

## 2023-09-18 NOTE — Assessment & Plan Note (Signed)
 Anxiety is managed with lorazepam at bedtime, but there is a desire to discontinue due to daytime sleepiness. She is planning to wean off venlafaxine. See plan for daytime sleepiness.

## 2023-09-18 NOTE — Assessment & Plan Note (Addendum)
 Daytime sleepiness is likely due to venlafaxine, affecting daily activities. Discussed options at length with patient and she wishes to discontinue venlafaxine. Advised to decrease venlafaxine to 37.5 mg daily for two weeks, then 37.5 mg every other day for two weeks before discontinuing. Monitor mood, depression, and withdrawal symptoms. Advised discussing with neurology during the upcoming appointment if nortriptyline could be contributing as well. Advised not to drive if sleepy. We will check lab work as outlined.

## 2023-09-18 NOTE — Progress Notes (Signed)
 MyChart Video Visit    Virtual Visit via Video Note   This visit type was conducted because this format is felt to be most appropriate for this patient at this time. Physical exam was limited by quality of the video and audio technology used for the visit. CMA was able to get the patient set up on a video visit.  Patient location: Home. Patient and provider in visit Provider location: Office  I discussed the limitations of evaluation and management by telemedicine and the availability of in person appointments. The patient expressed understanding and agreed to proceed.  Visit Date: 09/18/2023  Today's healthcare provider: Bluford Burkitt, NP     Subjective:    Patient ID: Kristy Hansen, female    DOB: Nov 10, 1985, 38 y.o.   MRN: [id number]  Chief Complaint  Patient presents with   Medication Management    HPI  Patient reports improvement in daytime sleepiness and fatigue since decreasing her Effexor to 37.5 mg daily. She has noticed a mild increase in her anxiety since decreasing but continues to use her Ativan only at night. She does not plan to wean completely off the medication at this time. She would like to continue on the decreased dose of 37.5 mg daily as she does that believe she can tolerate coming completely off of it. She is scheduled to follow up with her Neurologist to discuss her headaches and if the increase in Nortriptyline could be contributing to her daytime sleepiness.   Past Medical History:  Diagnosis Date   Anxiety    Autoimmune disease (HCC)    Chiari malformation type I (HCC)    Hematuria, gross    Panic attack    Pyelonephritis    Renal cyst     Past Surgical History:  Procedure Laterality Date   CYST REMOVAL PEDIATRIC     Spine   DENTAL SURGERY     MYRINGOTOMY WITH TUBE PLACEMENT     SPINE SURGERY      Family History  Problem Relation Age of Onset   Breast cancer Mother    Cancer Mother        breast cancer, ovarian cancer    Diabetes Mother    Hypertension Mother    Healthy Father    Diabetes Maternal Uncle    Diabetes Maternal Grandmother    Other Maternal Grandfather        murdered   Stroke Paternal Grandmother    Other Paternal Grandfather        unknown medical history   Kidney disease Neg Hx    Bladder Cancer Neg Hx    Prostate cancer Neg Hx     Social History   Socioeconomic History   Marital status: Divorced    Spouse name: Not on file   Number of children: Not on file   Years of education: Not on file   Highest education level: Not on file  Occupational History   Not on file  Tobacco Use   Smoking status: Former    Current packs/day: 0.00    Average packs/day: 0.5 packs/day for 15.0 years (7.5 ttl pk-yrs)    Types: Cigarettes    Start date: 2006    Quit date: 2021    Years since quitting: 4.2   Smokeless tobacco: Never  Vaping Use   Vaping status: Every Day   Substances: Nicotine, Flavoring  Substance and Sexual Activity   Alcohol use: Not Currently    Alcohol/week: 0.0 standard drinks of alcohol  Comment: ocassionally   Drug use: No   Sexual activity: Not Currently    Partners: Male    Birth control/protection: None  Other Topics Concern   Not on file  Social History Narrative   Not on file   Social Drivers of Health   Financial Resource Strain: Low Risk  (01/19/2018)   Overall Financial Resource Strain (CARDIA)    Difficulty of Paying Living Expenses: Not hard at all  Food Insecurity: No Food Insecurity (04/18/2021)   Hunger Vital Sign    Worried About Running Out of Food in the Last Year: Never true    Ran Out of Food in the Last Year: Never true  Transportation Needs: No Transportation Needs (02/12/2021)   PRAPARE - Administrator, Civil Service (Medical): No    Lack of Transportation (Non-Medical): No  Physical Activity: Sufficiently Active (01/19/2018)   Exercise Vital Sign    Days of Exercise per Week: 5 days    Minutes of Exercise per Session: 50  min  Stress: Stress Concern Present (01/19/2018)   [employer] of Occupational Health - Occupational Stress Questionnaire    Feeling of Stress : To some extent  Social Connections: Somewhat Isolated (01/19/2018)   Social Connection and Isolation Panel [NHANES]    Frequency of Communication with Friends and Family: More than three times a week    Frequency of Social Gatherings with Friends and Family: More than three times a week    Attends Religious Services: More than 4 times per year    Active Member of [employer] or Organizations: No    Attends Banker Meetings: Never    Marital Status: Divorced  Catering manager Violence: Unknown (01/19/2018)   Humiliation, Afraid, Rape, and Kick questionnaire    Fear of Current or Ex-Partner: Patient declined    Emotionally Abused: Patient declined    Physically Abused: Patient declined    Sexually Abused: Patient declined    Outpatient Medications Prior to Visit  Medication Sig Dispense Refill   EPINEPHrine (EPIPEN 2-PAK) 0.3 mg/0.3 mL IJ SOAJ injection INJECT 0.3MG  INTRAMUSCULARLY AS DIRECTED AS NEEDED FOR ALLERGIC REACTION. 2 each 3   LORazepam (ATIVAN) 0.5 MG tablet Take 1 tablet (0.5 mg total) by mouth every 8 (eight) hours as needed for anxiety or sleep. 45 tablet 2   metaxalone (SKELAXIN) 800 MG tablet Take 1 tablet (800 mg total) by mouth 3 (three) times daily as needed for muscle spasms. 90 tablet 3   triamcinolone cream (KENALOG) 0.1 % Apply 1 Application topically 2 (two) times daily as needed. 45 g 1   venlafaxine XR (EFFEXOR XR) 37.5 MG 24 hr capsule Take 1 capsule (37.5 mg total) by mouth daily with breakfast. 30 capsule 0   methylPREDNISolone (MEDROL DOSEPAK) 4 MG TBPK tablet Take as directed. 21 each 0   No facility-administered medications prior to visit.    Allergies  Allergen Reactions   Aspirin Hives   Bee Venom Anaphylaxis   Naproxen Hives   Penicillins Anaphylaxis, Shortness Of Breath and Swelling    Has  patient had a PCN reaction causing immediate rash, facial/tongue/throat swelling, SOB or lightheadedness with hypotension: Yes Has patient had a PCN reaction causing severe rash involving mucus membranes or skin necrosis: Unknown Has patient had a PCN reaction that required hospitalization: No Has patient had a PCN reaction occurring within the last 10 years: No If all of the above answers are "NO", then may proceed with Cephalosporin use.    Tramadol Hives  Varenicline Other (See Comments)    Nightmares  Other Reaction(s): Other (See Comments)  nightmares  Nightmares   Benzonatate     Hallucination  Tessalon pearls given me hallucination    ROS See HPI    Objective:    Physical Exam  Ht 5' 1.25" (1.556 m)   Wt 118 lb (53.5 kg)   BMI 22.11 kg/m  Wt Readings from Last 3 Encounters:  09/18/23 118 lb (53.5 kg)  09/11/23 118 lb 12.8 oz (53.9 kg)  07/28/23 115 lb 9.6 oz (52.4 kg)   GENERAL: alert, oriented, appears well and in no acute distress   HEENT: atraumatic, conjunttiva clear, no obvious abnormalities on inspection of external nose and ears   NECK: normal movements of the head and neck   LUNGS: on inspection no signs of respiratory distress, breathing rate appears normal, no obvious gross SOB, gasping or wheezing   CV: no obvious cyanosis   MS: moves all visible extremities without noticeable abnormality   PSYCH/NEURO: pleasant and cooperative, no obvious depression or anxiety, speech and thought processing grossly intact    Assessment & Plan:   Problem List Items Addressed This Visit     Adjustment disorder with mixed anxiety and depressed mood   Improvement in sleepiness since decreasing Effexor XR to 37.5 mg daily. She will continue at this dose. She does not wish to continue weaning off completely. She will continue to monitor mood, depression, and for withdrawal symptoms. Continue Ativan at bedtime as needed.       Excessive daytime sleepiness -  Primary   Improvement since decreasing Effexor XR to 37.5 mg daily. She will continue at this dose. She does not wish to continue weaning off completely. She will continue to monitor mood, depression, and for withdrawal symptoms. Advised discussing with neurology during the upcoming appointment if nortriptyline could be contributing as well. Advised not to drive if sleepy. She will follow up on May 9 as scheduled.        I have discontinued Gizella R. Qazi's methylPREDNISolone. I am also having her maintain her EPINEPHrine, metaxalone, LORazepam, venlafaxine XR, and triamcinolone cream.  No orders of the defined types were placed in this encounter.   I discussed the assessment and treatment plan with the patient. The patient was provided an opportunity to ask questions and all were answered. The patient agreed with the plan and demonstrated an understanding of the instructions.   The patient was advised to call back or seek an in-person evaluation if the symptoms worsen or if the condition fails to improve as anticipated.   Bluford Burkitt, NP [facility] [phone] (phone) [phone] (fax)  [facility]

## 2023-09-24 ENCOUNTER — Encounter: Payer: Self-pay | Admitting: Nurse Practitioner

## 2023-09-24 NOTE — Assessment & Plan Note (Deleted)
 Improvement since decreasing Effexor XR to 37.5 mg daily. She will continue at this dose. Continue Ativan as needed at bedtime. Follow up with Neurology as scheduled.

## 2023-09-24 NOTE — Assessment & Plan Note (Signed)
 Improvement in sleepiness since decreasing Effexor XR to 37.5 mg daily. She will continue at this dose. She does not wish to continue weaning off completely. She will continue to monitor mood, depression, and for withdrawal symptoms. Continue Ativan at bedtime as needed.

## 2023-09-24 NOTE — Assessment & Plan Note (Addendum)
 Improvement since decreasing Effexor XR to 37.5 mg daily. She will continue at this dose. She does not wish to continue weaning off completely. She will continue to monitor mood, depression, and for withdrawal symptoms. Advised discussing with neurology during the upcoming appointment if nortriptyline could be contributing as well. Advised not to drive if sleepy. She will follow up on May 9 as scheduled.

## 2023-09-28 ENCOUNTER — Other Ambulatory Visit: Payer: Self-pay | Admitting: Nurse Practitioner

## 2023-09-28 DIAGNOSIS — F4323 Adjustment disorder with mixed anxiety and depressed mood: Secondary | ICD-10-CM

## 2023-09-28 DIAGNOSIS — F5104 Psychophysiologic insomnia: Secondary | ICD-10-CM

## 2023-09-29 ENCOUNTER — Other Ambulatory Visit: Payer: Self-pay | Admitting: Physician Assistant

## 2023-09-29 ENCOUNTER — Encounter: Payer: Self-pay | Admitting: Physician Assistant

## 2023-09-29 DIAGNOSIS — M545 Low back pain, unspecified: Secondary | ICD-10-CM

## 2023-09-29 DIAGNOSIS — R200 Anesthesia of skin: Secondary | ICD-10-CM

## 2023-09-29 DIAGNOSIS — M542 Cervicalgia: Secondary | ICD-10-CM

## 2023-10-08 ENCOUNTER — Emergency Department

## 2023-10-08 ENCOUNTER — Emergency Department
Admission: EM | Admit: 2023-10-08 | Discharge: 2023-10-08 | Disposition: A | Discharge status: Home / Self Care | Attending: Emergency Medicine | Admitting: Emergency Medicine

## 2023-10-08 ENCOUNTER — Other Ambulatory Visit: Payer: Self-pay

## 2023-10-08 DIAGNOSIS — S169XXA Unspecified injury of muscle, fascia and tendon at neck level, initial encounter: Secondary | ICD-10-CM | POA: Diagnosis present

## 2023-10-08 DIAGNOSIS — G44209 Tension-type headache, unspecified, not intractable: Secondary | ICD-10-CM

## 2023-10-08 DIAGNOSIS — S161XXA Strain of muscle, fascia and tendon at neck level, initial encounter: Secondary | ICD-10-CM

## 2023-10-08 DIAGNOSIS — Y92410 Unspecified street and highway as the place of occurrence of the external cause: Secondary | ICD-10-CM | POA: Insufficient documentation

## 2023-10-08 MED ORDER — ACETAMINOPHEN 325 MG PO TABS
650.0000 mg | ORAL_TABLET | Freq: Once | ORAL | Status: AC
Start: 2023-10-08 — End: 2023-10-08
  Administered 2023-10-08: 650 mg via ORAL
  Filled 2023-10-08: qty 2

## 2023-10-08 MED ORDER — CYCLOBENZAPRINE HCL 10 MG PO TABS: 10.0000 mg | ORAL_TABLET | Freq: Three times a day (TID) | ORAL | 0 refills | Status: AC | PRN
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
## 2023-10-15 ENCOUNTER — Ambulatory Visit: Admitting: Neurosurgery

## 2023-10-15 ENCOUNTER — Encounter: Payer: Self-pay | Admitting: Neurosurgery

## 2023-10-15 VITALS — BP 118/88 | Ht 61.25 in | Wt 119.0 lb

## 2023-10-15 DIAGNOSIS — R42 Dizziness and giddiness: Secondary | ICD-10-CM | POA: Diagnosis not present

## 2023-10-15 DIAGNOSIS — G935 Compression of brain: Secondary | ICD-10-CM

## 2023-10-16 ENCOUNTER — Encounter: Payer: Self-pay | Admitting: Nurse Practitioner

## 2023-10-16 ENCOUNTER — Ambulatory Visit: Admitting: Nurse Practitioner

## 2023-10-16 VITALS — BP 118/80 | HR 88 | Temp 97.6°F | Ht 61.25 in | Wt 119.2 lb

## 2023-10-16 DIAGNOSIS — M542 Cervicalgia: Secondary | ICD-10-CM | POA: Insufficient documentation

## 2023-10-16 DIAGNOSIS — G4719 Other hypersomnia: Secondary | ICD-10-CM

## 2023-10-16 DIAGNOSIS — F4323 Adjustment disorder with mixed anxiety and depressed mood: Secondary | ICD-10-CM

## 2023-10-16 DIAGNOSIS — G8929 Other chronic pain: Secondary | ICD-10-CM

## 2023-10-16 DIAGNOSIS — G935 Compression of brain: Secondary | ICD-10-CM

## 2023-10-16 DIAGNOSIS — M549 Dorsalgia, unspecified: Secondary | ICD-10-CM

## 2023-10-16 DIAGNOSIS — R519 Headache, unspecified: Secondary | ICD-10-CM

## 2023-10-16 MED ORDER — CYCLOBENZAPRINE HCL 10 MG PO TABS: 10.0000 mg | ORAL_TABLET | Freq: Every day | ORAL | 1 refills | Status: DC
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
## 2023-10-16 NOTE — Assessment & Plan Note (Signed)
 Daytime sleepiness has improved after stopping nortriptyline. She experiences good sleep quality with occasional nightmares. Flexeril  aids sleep without Ativan . Continue the current regimen without nortriptyline and monitor sleep quality and daytime alertness.

## 2023-10-16 NOTE — Assessment & Plan Note (Signed)
 Effexor  is being titrated to 150 mg. Her mood is good, but irritability persists due to pain. Sleepiness and headaches have improved after stopping nortriptyline. Increase Effexor  to 150 mg starting Sunday and monitor mood and irritability as the dose increases. Counseled patient on common side effects. Encouraged to contact if worsening symptoms, unusual behavior changes or suicidal thoughts occur.
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
## 2023-10-16 NOTE — Assessment & Plan Note (Signed)
 Chiari malformation is causing neck and back pain. Await cervical MRI results to guide further management. Follow up with Neuro/Neurosurgery as scheduled.

## 2023-10-16 NOTE — Assessment & Plan Note (Signed)
 Improvement since stopping nortriptyline. Increasing Effexor  on Sunday from 75 mg daily to 150 mg daily. Hopeful for greater reduction in headaches with increased dose. We will continue to monitor. Follow up with Neurology as scheduled.

## 2023-10-19 ENCOUNTER — Ambulatory Visit
Admission: RE | Admit: 2023-10-19 | Discharge: 2023-10-19 | Disposition: A | Discharge status: Home / Self Care | Source: Ambulatory Visit | Attending: Physician Assistant | Admitting: Physician Assistant

## 2023-10-19 DIAGNOSIS — R200 Anesthesia of skin: Secondary | ICD-10-CM

## 2023-10-19 DIAGNOSIS — M542 Cervicalgia: Secondary | ICD-10-CM

## 2023-11-16 ENCOUNTER — Other Ambulatory Visit: Payer: Self-pay | Admitting: Physician Assistant

## 2023-11-16 DIAGNOSIS — M545 Low back pain, unspecified: Secondary | ICD-10-CM

## 2023-11-16 DIAGNOSIS — R200 Anesthesia of skin: Secondary | ICD-10-CM

## 2023-11-18 ENCOUNTER — Encounter: Payer: Self-pay | Admitting: Physician Assistant

## 2023-11-22 ENCOUNTER — Encounter: Payer: Self-pay | Admitting: Adult Health

## 2023-11-23 ENCOUNTER — Other Ambulatory Visit

## 2023-11-26 ENCOUNTER — Other Ambulatory Visit: Payer: Self-pay | Admitting: Family Medicine

## 2023-11-26 DIAGNOSIS — M5414 Radiculopathy, thoracic region: Secondary | ICD-10-CM

## 2023-11-28 ENCOUNTER — Ambulatory Visit
Admission: RE | Admit: 2023-11-28 | Discharge: 2023-11-28 | Disposition: A | Discharge status: Home / Self Care | Source: Ambulatory Visit | Attending: Family Medicine | Admitting: Family Medicine

## 2023-11-28 DIAGNOSIS — M5414 Radiculopathy, thoracic region: Secondary | ICD-10-CM

## 2023-12-03 ENCOUNTER — Encounter: Payer: Self-pay | Admitting: Nurse Practitioner

## 2023-12-16 ENCOUNTER — Encounter: Payer: Self-pay | Admitting: Nurse Practitioner

## 2023-12-16 ENCOUNTER — Ambulatory Visit: Admitting: Nurse Practitioner

## 2023-12-16 VITALS — BP 110/76 | HR 73 | Temp 97.7°F | Ht 61.25 in | Wt 117.6 lb

## 2023-12-16 DIAGNOSIS — M542 Cervicalgia: Secondary | ICD-10-CM

## 2023-12-16 DIAGNOSIS — G935 Compression of brain: Secondary | ICD-10-CM | POA: Diagnosis not present

## 2023-12-16 DIAGNOSIS — R319 Hematuria, unspecified: Secondary | ICD-10-CM

## 2023-12-16 DIAGNOSIS — G8929 Other chronic pain: Secondary | ICD-10-CM

## 2023-12-16 DIAGNOSIS — M549 Dorsalgia, unspecified: Secondary | ICD-10-CM

## 2023-12-16 DIAGNOSIS — F4323 Adjustment disorder with mixed anxiety and depressed mood: Secondary | ICD-10-CM

## 2023-12-16 LAB — POC URINALSYSI DIPSTICK (AUTOMATED)
Bilirubin, UA: NEGATIVE
Blood, UA: NEGATIVE
Glucose, UA: NEGATIVE
Ketones, UA: NEGATIVE
Leukocytes, UA: NEGATIVE
Nitrite, UA: NEGATIVE
Protein, UA: NEGATIVE
Spec Grav, UA: 1.02 (ref 1.010–1.025)
Urobilinogen, UA: 1 U/dL
pH, UA: 7.5 (ref 5.0–8.0)

## 2023-12-16 LAB — URINALYSIS, ROUTINE W REFLEX MICROSCOPIC
Bilirubin Urine: NEGATIVE
Hgb urine dipstick: NEGATIVE
Ketones, ur: NEGATIVE
Leukocytes,Ua: NEGATIVE
Nitrite: NEGATIVE
Specific Gravity, Urine: 1.015 (ref 1.000–1.030)
Total Protein, Urine: NEGATIVE
Urine Glucose: NEGATIVE
Urobilinogen, UA: 1 (ref 0.0–1.0)
pH: 7.5 (ref 5.0–8.0)
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
## 2023-12-17 LAB — URINE CULTURE
MICRO NUMBER:: 16676797
Result:: NO GROWTH
SPECIMEN QUALITY:: ADEQUATE

## 2023-12-18 ENCOUNTER — Ambulatory Visit: Payer: Self-pay | Admitting: Nurse Practitioner

## 2023-12-23 ENCOUNTER — Encounter: Payer: Self-pay | Admitting: Nurse Practitioner

## 2023-12-23 DIAGNOSIS — R319 Hematuria, unspecified: Secondary | ICD-10-CM | POA: Insufficient documentation

## 2023-12-23 MED ORDER — CYCLOBENZAPRINE HCL 10 MG PO TABS: 10.0000 mg | ORAL_TABLET | Freq: Three times a day (TID) | ORAL | 0 refills | Status: DC | PRN
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
## 2023-12-23 NOTE — Assessment & Plan Note (Signed)
 She has significant anxiety related to her health, caregiving responsibilities, and potential bad news, which may worsen her pain perception. Continue Effexor  daily and Ativan  as needed for anxiety and encourage communication for reassurance. Provide emotional support and reassurance.

## 2023-12-23 NOTE — Assessment & Plan Note (Signed)
 Resolved. Noted at urgent care. She denies urinary symptoms today. UA in office- WNL. Urine has been sent for microscopic and culture. Reassure her about the absence of infection or stones.

## 2024-03-09 ENCOUNTER — Other Ambulatory Visit: Payer: Self-pay | Admitting: Nurse Practitioner

## 2024-03-09 DIAGNOSIS — F5104 Psychophysiologic insomnia: Secondary | ICD-10-CM

## 2024-03-09 DIAGNOSIS — F4323 Adjustment disorder with mixed anxiety and depressed mood: Secondary | ICD-10-CM

## 2024-04-11 NOTE — Telephone Encounter (Signed)
 Pt was told to reschedule an appt per neurology. I did go ahead and schedule her based on this & also she thought that would be appropriate.

## 2024-04-19 ENCOUNTER — Ambulatory Visit (INDEPENDENT_AMBULATORY_CARE_PROVIDER_SITE_OTHER): Admitting: Nurse Practitioner

## 2024-04-19 VITALS — BP 108/68 | HR 104 | Temp 97.8°F | Ht 61.25 in | Wt 116.6 lb

## 2024-04-19 DIAGNOSIS — F4323 Adjustment disorder with mixed anxiety and depressed mood: Secondary | ICD-10-CM | POA: Diagnosis not present

## 2024-04-19 DIAGNOSIS — M542 Cervicalgia: Secondary | ICD-10-CM | POA: Diagnosis not present

## 2024-04-19 DIAGNOSIS — F5104 Psychophysiologic insomnia: Secondary | ICD-10-CM | POA: Diagnosis not present

## 2024-04-19 DIAGNOSIS — R519 Headache, unspecified: Secondary | ICD-10-CM | POA: Diagnosis not present

## 2024-04-19 DIAGNOSIS — M45A0 Non-radiographic axial spondyloarthritis of unspecified sites in spine: Secondary | ICD-10-CM

## 2024-04-19 MED ORDER — LORAZEPAM 0.5 MG PO TABS: 0.5000 mg | ORAL_TABLET | Freq: Three times a day (TID) | ORAL | 2 refills | Status: AC | PRN

## 2024-04-19 NOTE — Progress Notes (Signed)
 Kristy Glance, NP-C Phone: [phone]  Kristy Hansen is a 38 y.o. female who presents today for follow up.   Discussed the use of AI scribe software for clinical note transcription with the patient, who gave verbal consent to proceed.  History of Present Illness   Kristy Hansen is a 38 year old female who presents with sleep disturbances and emotional changes.  She is currently undergoing physical therapy for back pain and has completed therapy for her neck in early October. She continues to experience pain and is awaiting insurance approval for Cimzia. She has not started this medication due to pending approval.  She has been started on Emgality for migraines and is on her second dose. Despite this, she experienced a headache lasting five days recently. She also takes gabapentin at night for pain, although she is unsure of its effects due to concurrent medication use.  Her current medication regimen includes Effexor  150 mg, gabapentin, and Ativan  0.5 mg at night for anxiety. She has difficulty sleeping, attributing it to emotional changes and possibly medication effects. She describes being 'super emotional' and having trouble sleeping, often getting only a few hours of sleep per night.  She describes recent emotional changes, feeling 'super happy' yet crying frequently without clear cause. She attributes some of this to recent life changes, including ending a relationship with her boyfriend, which has led to a more peaceful home environment. She is adjusting to these changes and is unsure if her emotional state is affecting her sleep.  Her sleep schedule is irregular, often getting only two to four hours of sleep at night, with occasional naps during the day. She reports a structured routine for her children but finds it challenging to maintain consistent sleep hygiene for herself.      Social History   Tobacco Use  Smoking Status Former   Current packs/day: 0.00   Average  packs/day: 0.5 packs/day for 15.0 years (7.5 ttl pk-yrs)   Types: Cigarettes   Start date: 2006   Quit date: 2021   Years since quitting: 4.8  Smokeless Tobacco Never    Current Outpatient Medications on File Prior to Visit  Medication Sig Dispense Refill   cyclobenzaprine  (FLEXERIL ) 10 MG tablet Take 1 tablet (10 mg total) by mouth 3 (three) times daily as needed for muscle spasms. 90 tablet 0   EMGALITY 120 MG/ML SOAJ SMARTSIG:1 pre-filled pen syringe SUB-Q Once a Month     EPINEPHrine  (EPIPEN  2-PAK) 0.3 mg/0.3 mL IJ SOAJ injection INJECT 0.3MG  INTRAMUSCULARLY AS DIRECTED AS NEEDED FOR ALLERGIC REACTION. 2 each 3   gabapentin (NEURONTIN) 100 MG capsule Take 100-200 mg by mouth 2 (two) times daily.     ibuprofen (ADVIL) 600 MG tablet Take 600 mg by mouth 3 (three) times daily.     loratadine  (CLARITIN ) 5 MG/5ML syrup Take 5 mg by mouth.     meloxicam (MOBIC) 15 MG tablet Take 1 tablet every day by oral route.     venlafaxine  XR (EFFEXOR -XR) 150 MG 24 hr capsule Take 150 mg by mouth daily with breakfast.     No current facility-administered medications on file prior to visit.     ROS see history of present illness  Objective  Physical Exam Vitals:   04/19/24 0853  BP: 108/68  Pulse: (!) 104  Temp: 97.8 F (36.6 C)  SpO2: 99%    BP Readings from Last 3 Encounters:  04/19/24 108/68  12/16/23 110/76  10/16/23 118/80   Wt Readings from Last  3 Encounters:  04/19/24 116 lb 9.6 oz (52.9 kg)  12/16/23 117 lb 9.6 oz (53.3 kg)  10/16/23 119 lb 3.2 oz (54.1 kg)    Physical Exam Constitutional:      General: She is not in acute distress.    Appearance: Normal appearance.  HENT:     Head: Normocephalic.  Cardiovascular:     Rate and Rhythm: Normal rate and regular rhythm.     Heart sounds: Normal heart sounds.  Pulmonary:     Effort: Pulmonary effort is normal.     Breath sounds: Normal breath sounds.  Skin:    General: Skin is warm and dry.  Neurological:      General: No focal deficit present.     Mental Status: She is alert.  Psychiatric:        Mood and Affect: Mood normal.        Behavior: Behavior normal.      Assessment/Plan: Please see individual problem list.  Adjustment disorder with mixed anxiety and depressed mood Assessment & Plan: Emotional lability may be linked to recent life changes. Current medications are Effexor  and lorazepam . Symptoms could be exacerbated by sleep deprivation and stress. Continue Effexor  150 mg daily and lorazepam  0.5 mg at night as needed for anxiety. Encourage a consistent bedtime routine to improve sleep hygiene. Advise taking two lorazepam  if needed for sleep, with caution against regular use. PDMP reviewed. We will continue to monitor.   Orders: -     LORazepam ; Take 1 tablet (0.5 mg total) by mouth every 8 (eight) hours as needed for anxiety or sleep.  Dispense: 45 tablet; Refill: 2  Psychophysiologic insomnia Assessment & Plan: She experiences difficulty sleeping with variable sleep duration, possibly related to emotional stress and anxiety. Current medications include gabapentin and lorazepam . Encourage a consistent sleep schedule and good sleep hygiene. Advise taking two lorazepam  if needed for sleep, cautioning against regular use. Discuss the importance of listening to her body's sleep needs and allowing rest.   Orders: -     LORazepam ; Take 1 tablet (0.5 mg total) by mouth every 8 (eight) hours as needed for anxiety or sleep.  Dispense: 45 tablet; Refill: 2  Cervicalgia Assessment & Plan: She experiences chronic pain and numbness in her arms and face. Imaging shows no surgical abnormalities, and a herniated cervical disc is not impinging on structures. She is undergoing physical therapy and has completed EMG studies. Continue physical therapy. Follow up with Neurology and Physical Medicine.    Headache disorder Assessment & Plan: Improvement since stopping nortriptyline. Managed by Neurology  with Effexor  and Emgality. Continue current management. Follow up as scheduled.    Non-radiographic axial spondyloarthritis [facility]) Assessment & Plan: Managed by Rheumatology. Follow up as scheduled.       Return in about 6 months (around 10/17/2024) for Follow up.   Kristy Glance, NP-C [facility]

## 2024-04-28 ENCOUNTER — Ambulatory Visit: Admitting: Neurosurgery

## 2024-05-02 ENCOUNTER — Encounter: Payer: Self-pay | Admitting: Nurse Practitioner

## 2024-05-02 NOTE — Assessment & Plan Note (Addendum)
 Emotional lability may be linked to recent life changes. Current medications are Effexor  and lorazepam . Symptoms could be exacerbated by sleep deprivation and stress. Continue Effexor  150 mg daily and lorazepam  0.5 mg at night as needed for anxiety. Encourage a consistent bedtime routine to improve sleep hygiene. Advise taking two lorazepam  if needed for sleep, with caution against regular use. PDMP reviewed. We will continue to monitor.

## 2024-05-02 NOTE — Assessment & Plan Note (Signed)
 She experiences chronic pain and numbness in her arms and face. Imaging shows no surgical abnormalities, and a herniated cervical disc is not impinging on structures. She is undergoing physical therapy and has completed EMG studies. Continue physical therapy. Follow up with Neurology and Physical Medicine.

## 2024-05-02 NOTE — Assessment & Plan Note (Signed)
 Improvement since stopping nortriptyline. Managed by Neurology with Effexor  and Emgality. Continue current management. Follow up as scheduled.

## 2024-05-02 NOTE — Assessment & Plan Note (Signed)
 She experiences difficulty sleeping with variable sleep duration, possibly related to emotional stress and anxiety. Current medications include gabapentin and lorazepam . Encourage a consistent sleep schedule and good sleep hygiene. Advise taking two lorazepam  if needed for sleep, cautioning against regular use. Discuss the importance of listening to her body's sleep needs and allowing rest.

## 2024-05-02 NOTE — Assessment & Plan Note (Signed)
 Managed by Rheumatology. Follow up as scheduled.

## 2024-05-19 ENCOUNTER — Other Ambulatory Visit: Payer: Self-pay | Admitting: Nurse Practitioner

## 2024-05-19 DIAGNOSIS — G8929 Other chronic pain: Secondary | ICD-10-CM

## 2024-05-19 DIAGNOSIS — G935 Compression of brain: Secondary | ICD-10-CM

## 2024-05-19 DIAGNOSIS — M542 Cervicalgia: Secondary | ICD-10-CM

## 2024-06-10 NOTE — H&P (Signed)
 " [name] is a 39 y.o. female here forL/S BTL  History of Present Illness Kristy Hansen is a 39 year old female who presents for consultation regarding sterilization.   She has a history of four pregnancies, all resulting in vaginal deliveries, with children aged 13, sixteen, fourteen, and ten. During her last pregnancy ten years ago, she experienced preeclampsia but had no other complications with her deliveries.   No history of lung disease, heart disease, or pelvic infections. She has not undergone any abdominal surgeries. She is in a stable relationship.   She has a known allergy to penicillin, which causes her heart to 'close up', and she carries an EpiPen  for bee allergies.   Her last Pap smear was approximately two years ago, and she is unsure of her current status regarding gynecologic exams. She has no family history of gynecologic cancers.   She is covered by [facility] and has had routine labs, including cholesterol screening, performed by her primary care provider multiple times this year.     Past Medical History:  has a past medical history of Adjustment disorder with mixed anxiety and depressed mood (10/16/2022), Anemia, Anxiety, Chiari malformation type I (CMS/HHS-HCC) (10/20/2017), Family history of breast cancer in mother (09/09/2022), Family history of ovarian cancer (12/12/2014), Headache, Paresthesias (12/12/2014), Preeclampsia (HHS-HCC), Psychophysiologic insomnia (04/03/2023), and Renal cyst (01/05/2015).  Past Surgical History:  has a past surgical history that includes cyst on spine; tubes placed; Spine surgery; and Tonsillectomy. Family History: family history includes Alcohol abuse in her father; Breast cancer in her mother; Cancer in her mother; Clotting disorder in her mother; Depression in her father; Migraines in her brother; Myocardial Infarction (Heart attack) in her mother; Skin cancer in her mother; Stroke in her maternal grandmother. Social History:   reports that she has quit smoking. Her smoking use included cigarettes. She has never used smokeless tobacco. She reports that she does not drink alcohol and does not use drugs. OB/GYN History:  OB History       Gravida  4   Para  4   Term  4   Preterm  0   AB  0   Living  4        SAB  0   IAB  0   Ectopic  0   Molar  0   Multiple  0   Live Births  4             Allergies: is allergic to aluminum aspirin, penicillin, varenicline , venom-honey bee, aspirin, benzonatate , naproxen, pseudoephedrine-guaifenesin , and tramadol. Medications:  Current Medications    Current Outpatient Medications:    ascorbic acid, vitamin C, (VITAMIN C) 500 MG tablet, Take 500 mg by mouth once daily, Disp: , Rfl:    cyanocobalamin  (VITAMIN B12) 1000 MCG tablet, Take 1,000 mcg by mouth once daily, Disp: , Rfl:    cyclobenzaprine  (FLEXERIL ) 10 MG tablet, Take 10 mg by mouth 3 (three) times daily as needed for Muscle spasms, Disp: , Rfl:    EPINEPHrine  (EPIPEN ) 0.3 mg/0.3 mL auto-injector, Inject 0.3 mg into the muscle as needed for Anaphylaxis, Disp: , Rfl:    gabapentin (NEURONTIN) 100 MG capsule, Take 100 mg 1-2 times daily, Disp: 60 capsule, Rfl: 3   galcanezumab-gnlm 120 mg/mL PnIj, Inject 120 mg subcutaneously monthly, Disp: 1 mL, Rfl: 5   loratadine  (CLARITIN ) 5 mg/5 mL solution, Take 5 mg by mouth once daily Reported on 07/19/2015, Disp: , Rfl:    LORazepam  (ATIVAN ) 0.5 MG tablet,  Take 0.5 mg by mouth at bedtime as needed for Anxiety or Sleep, Disp: , Rfl:    meloxicam (MOBIC) 7.5 MG tablet, Take up to 1-2 tablets daily by mouth as needed for pain., Disp: 60 tablet, Rfl: 2   venlafaxine  (EFFEXOR -XR) 150 MG XR capsule, Continue Effexor  150mg  once daily, Disp: 30 capsule, Rfl: 3   diazePAM  (VALIUM ) 5 MG tablet, 1-2 tablets 30 minutes before MRI (Patient not taking: Reported on 05/20/2024), Disp: 2 tablet, Rfl: 0   pantoprazole (PROTONIX) 40 MG DR tablet, Take 1 tablet (40 mg total) by  mouth once daily (Patient not taking: Reported on 05/20/2024), Disp: 30 tablet, Rfl: 3     Review of Systems: General:                      No fatigue or weight loss Eyes:                           No vision changes Ears:                            No hearing difficulty Respiratory:                No cough or shortness of breath Pulmonary:                  No asthma or shortness of breath Cardiovascular:           No chest pain, palpitations, dyspnea on exertion Gastrointestinal:          No abdominal bloating, chronic diarrhea, constipations, masses, pain or hematochezia Genitourinary:             No hematuria, dysuria, abnormal vaginal discharge, pelvic pain, Menometrorrhagia Lymphatic:                   No swollen lymph nodes Musculoskeletal:No muscle weakness Neurologic:                  No extremity weakness, syncope, seizure disorder Psychiatric:                  No history of depression, delusions or suicidal/homicidal ideation      Exam:       Vitals:    06/10/24  0843  BP: 131/89  Pulse: 93      Body mass index is 23.43 kg/m.   WDWN white female in NAD   Lungs: CTA  CV : RRR without murmur   Breast: exam done in sitting and lying position : No dimpling or retraction, no dominant mass, no spontaneous discharge, no axillary adenopathy Neck:  no thyromegaly Abdomen: soft , no mass, normal active bowel sounds,  non-tender, no rebound tenderness Pelvic: tanner stage 5 ,  External genitalia: vulva /labia no lesions Urethra: no prolapse Vagina: normal physiologic d/c Cervix: no lesions, no cervical motion tenderness   Uterus: normal size shape and contour, non-tender Adnexa: no mass,  non-tender   Rectovaginal:  Pelvic exam done to collect PAP Chaperone present     Impression:    The primary encounter diagnosis was Encntr for gyn exam (general) (routine) w/o abn findings. Diagnoses of Counseling performed for elective sterilization, Screening for cervical cancer, and  Healthcare maintenance were also pertinent to this visit.       Plan:    Assessment & Plan Woman's Wellness Visit Routine wellness visit with  no significant medical history except for preeclampsia during last pregnancy. No current health issues. Allergies to penicillin and bee stings. No family history of gynecologic cancers. Last Pap smear was two years ago with normal results. Routine labs and cholesterol screening are up to date. - Performed pelvic exam. - Conducted Pap smear. - Updated gynecologic, pelvic, and breast exams.   Sterilization planning Interested in laparoscopic sterilization. Four vaginal deliveries with preeclampsia during last pregnancy. Discussed laparoscopic sterilization procedure, including incisions under the umbilicus and two port sites. Procedure duration is approximately 20 minutes with same-day discharge. Risks include organ injury, bleeding, and infection, though these are infrequent. No anticipated risks due to lack of pelvic infections or abdominal surgeries. Medicaid consent form required for procedure coverage. - Obtained consent form for sterilization. - Provided posting slip for surgery scheduling. - Instructed to fill out Medicaid consent form for procedure coverage. - Scheduled sterilization procedure after January 12th.   Risk of the procedure discussed with pt - see KC           Orders Placed This Encounter  Procedures   Urine Culture, Routine - [facility]      Release to patient:   Immediate   CBC w/auto Differential (5 Part)      Release to patient:   Immediate   Lipid Panel w/calc LDL      Release to patient:   Immediate   Urinalysis w/Microscopic      Release to patient:   Immediate   Basic Metabolic Panel (BMP)      Release to patient:   Immediate   IGP, Aptima HPV, rfx 16/18,45 - [facility]      Release to patient:   Immediate      No follow-ups on file.   DEBBY CLARYCE DINSMORE, MD "

## 2024-06-21 ENCOUNTER — Telehealth: Payer: Self-pay

## 2024-06-21 ENCOUNTER — Encounter: Payer: Self-pay | Admitting: Nurse Practitioner

## 2024-06-21 NOTE — Telephone Encounter (Signed)
 Patient scheduled an appointment with Leron Glance, FNP-C, via MyChart on 08/18/2024, with the following comment, High blood pressure.  I called patient to get her blood pressure reading, in case she needed to speak with a triage nurse.  Patient states she has already spoken with [facility] nurse and it was recommended that she go to Urgent Care.  Patient states she called Urgent Care and they told her that they couldn't prescribe anything for her.  Patient states they recommended she see her PCP, and go to the ED if it gets really bad.  Patient states she checks her blood pressure at home with her device and her boyfriend is an EMT, so he will be checking it.  Patient states she is in pain a good part of the time and she believes this helps keep her blood pressure high.  Patient states she has a condition, chiari malformation, which can cause dizziness and blurry vision, as well as headaches, so she never knows if it's because of the condition or her blood pressure just being high.

## 2024-06-21 NOTE — Telephone Encounter (Signed)
 Error

## 2024-06-21 NOTE — Telephone Encounter (Signed)
 Please call her and let her know that if she is having elevated blood pressure, headache and vision change and blood pressure per her report above 100 - agree with need for evaluation. Would recommend evaluation now to confirm nothing acute going on.

## 2024-06-21 NOTE — Telephone Encounter (Signed)
 Called and spoke with pt, she had called [facility].  Reported that they would be happy to see her and check BP but would not prescribe any medications.  Encouraged pt to be seen at one of the [facility] UC; let her know there was one located on [address] across from our office and at [facility] across from [address].  Pt reports that she will try those locations.  Encouraged pt to call back if she is not seen today.  Pt verbalized understanding.

## 2024-06-21 NOTE — Telephone Encounter (Signed)
 She still needs to be seen. Can confirm which urgent care she called. There are other acute cares- urgent cares.

## 2024-06-23 DIAGNOSIS — Z01818 Encounter for other preprocedural examination: Secondary | ICD-10-CM

## 2024-07-21 ENCOUNTER — Other Ambulatory Visit

## 2024-07-28 ENCOUNTER — Ambulatory Visit: Admit: 2024-07-28 | Admitting: Obstetrics and Gynecology

## 2024-07-28 DIAGNOSIS — Z01818 Encounter for other preprocedural examination: Secondary | ICD-10-CM

## 2024-08-18 ENCOUNTER — Ambulatory Visit: Admitting: Nurse Practitioner

## 2024-10-18 ENCOUNTER — Ambulatory Visit: Admitting: Nurse Practitioner
